# Patient Record
Sex: Female | Born: 1960
Health system: Southern US, Community
[De-identification: ages and names within clinical notes are randomized; demographics above are authoritative.]

## PROBLEM LIST (undated history)

## (undated) DIAGNOSIS — Z8711 Personal history of peptic ulcer disease: Secondary | ICD-10-CM

## (undated) DIAGNOSIS — J45909 Unspecified asthma, uncomplicated: Secondary | ICD-10-CM

## (undated) DIAGNOSIS — T4145XA Adverse effect of unspecified anesthetic, initial encounter: Secondary | ICD-10-CM

## (undated) DIAGNOSIS — M1990 Unspecified osteoarthritis, unspecified site: Secondary | ICD-10-CM

## (undated) DIAGNOSIS — R232 Flushing: Secondary | ICD-10-CM

## (undated) DIAGNOSIS — G4730 Sleep apnea, unspecified: Secondary | ICD-10-CM

## (undated) DIAGNOSIS — Z8719 Personal history of other diseases of the digestive system: Secondary | ICD-10-CM

## (undated) DIAGNOSIS — Z9289 Personal history of other medical treatment: Secondary | ICD-10-CM

## (undated) DIAGNOSIS — K219 Gastro-esophageal reflux disease without esophagitis: Secondary | ICD-10-CM

## (undated) DIAGNOSIS — C50412 Malignant neoplasm of upper-outer quadrant of left female breast: Principal | ICD-10-CM

## (undated) DIAGNOSIS — T8859XA Other complications of anesthesia, initial encounter: Secondary | ICD-10-CM

## (undated) DIAGNOSIS — Z8489 Family history of other specified conditions: Secondary | ICD-10-CM

## (undated) DIAGNOSIS — IMO0001 Reserved for inherently not codable concepts without codable children: Secondary | ICD-10-CM

## (undated) DIAGNOSIS — D649 Anemia, unspecified: Secondary | ICD-10-CM

## (undated) DIAGNOSIS — C50912 Malignant neoplasm of unspecified site of left female breast: Secondary | ICD-10-CM

## (undated) DIAGNOSIS — R0609 Other forms of dyspnea: Secondary | ICD-10-CM

## (undated) DIAGNOSIS — C50911 Malignant neoplasm of unspecified site of right female breast: Secondary | ICD-10-CM

## (undated) DIAGNOSIS — C792 Secondary malignant neoplasm of skin: Secondary | ICD-10-CM

## (undated) DIAGNOSIS — N6320 Unspecified lump in the left breast, unspecified quadrant: Secondary | ICD-10-CM

## (undated) DIAGNOSIS — R0601 Orthopnea: Secondary | ICD-10-CM

## (undated) DIAGNOSIS — C50919 Malignant neoplasm of unspecified site of unspecified female breast: Secondary | ICD-10-CM

## (undated) DIAGNOSIS — C50311 Malignant neoplasm of lower-inner quadrant of right female breast: Secondary | ICD-10-CM

## (undated) DIAGNOSIS — Z853 Personal history of malignant neoplasm of breast: Secondary | ICD-10-CM

## (undated) HISTORY — PX: UPPER GI ENDOSCOPY: SHX6162

## (undated) HISTORY — DX: Malignant neoplasm of upper-outer quadrant of left female breast: C50.412

## (undated) HISTORY — PX: COLONOSCOPY: SHX174

## (undated) HISTORY — DX: Flushing: R23.2

## (undated) HISTORY — PX: ABDOMINAL HYSTERECTOMY: SHX81

## (undated) HISTORY — DX: Anemia, unspecified: D64.9

## (undated) HISTORY — PX: TUBAL LIGATION: SHX77

## (undated) HISTORY — PX: HERNIA REPAIR: SHX51

---

## 2015-07-20 ENCOUNTER — Other Ambulatory Visit: Payer: Self-pay | Admitting: Physician Assistant

## 2015-07-20 DIAGNOSIS — R599 Enlarged lymph nodes, unspecified: Secondary | ICD-10-CM

## 2015-07-21 ENCOUNTER — Other Ambulatory Visit: Payer: Self-pay | Admitting: Physician Assistant

## 2015-07-21 DIAGNOSIS — R599 Enlarged lymph nodes, unspecified: Secondary | ICD-10-CM

## 2015-07-21 DIAGNOSIS — M7989 Other specified soft tissue disorders: Secondary | ICD-10-CM

## 2015-07-30 ENCOUNTER — Ambulatory Visit
Admission: RE | Admit: 2015-07-30 | Discharge: 2015-07-30 | Disposition: A | Discharge status: Home / Self Care | Payer: BLUE CROSS/BLUE SHIELD | Source: Ambulatory Visit | Attending: Physician Assistant | Admitting: Physician Assistant

## 2015-07-30 ENCOUNTER — Other Ambulatory Visit: Payer: Self-pay | Admitting: Physician Assistant

## 2015-07-30 DIAGNOSIS — M7989 Other specified soft tissue disorders: Secondary | ICD-10-CM

## 2015-07-30 DIAGNOSIS — N6320 Unspecified lump in the left breast, unspecified quadrant: Secondary | ICD-10-CM

## 2015-07-30 DIAGNOSIS — R599 Enlarged lymph nodes, unspecified: Secondary | ICD-10-CM

## 2015-08-05 ENCOUNTER — Ambulatory Visit
Admission: RE | Admit: 2015-08-05 | Discharge: 2015-08-05 | Disposition: A | Discharge status: Home / Self Care | Payer: BLUE CROSS/BLUE SHIELD | Source: Ambulatory Visit | Attending: Physician Assistant | Admitting: Physician Assistant

## 2015-08-05 ENCOUNTER — Other Ambulatory Visit: Payer: Self-pay | Admitting: Physician Assistant

## 2015-08-05 DIAGNOSIS — R599 Enlarged lymph nodes, unspecified: Secondary | ICD-10-CM

## 2015-08-05 DIAGNOSIS — N6320 Unspecified lump in the left breast, unspecified quadrant: Secondary | ICD-10-CM

## 2015-08-07 ENCOUNTER — Telehealth: Payer: Self-pay | Admitting: *Deleted

## 2015-08-07 ENCOUNTER — Encounter: Payer: Self-pay | Admitting: *Deleted

## 2015-08-07 DIAGNOSIS — C50412 Malignant neoplasm of upper-outer quadrant of left female breast: Secondary | ICD-10-CM

## 2015-08-07 HISTORY — DX: Malignant neoplasm of upper-outer quadrant of left female breast: C50.412

## 2015-08-07 NOTE — Telephone Encounter (Signed)
Left vm for pt to return call regarding [place] for 08/12/15. Contact information provided.

## 2015-08-07 NOTE — Telephone Encounter (Signed)
Confirmed [facility] for 08/12/15 at 1215 .  Instructions and contact information given.

## 2015-08-12 ENCOUNTER — Other Ambulatory Visit: Payer: Self-pay | Admitting: General Surgery

## 2015-08-12 ENCOUNTER — Encounter: Payer: Self-pay | Admitting: Radiation Oncology

## 2015-08-12 ENCOUNTER — Ambulatory Visit: Payer: BLUE CROSS/BLUE SHIELD | Attending: General Surgery | Admitting: Physical Therapy

## 2015-08-12 ENCOUNTER — Other Ambulatory Visit (HOSPITAL_BASED_OUTPATIENT_CLINIC_OR_DEPARTMENT_OTHER): Payer: BLUE CROSS/BLUE SHIELD

## 2015-08-12 ENCOUNTER — Encounter: Payer: Self-pay | Admitting: Oncology

## 2015-08-12 ENCOUNTER — Ambulatory Visit
Admission: RE | Admit: 2015-08-12 | Discharge: 2015-08-12 | Disposition: A | Discharge status: Home / Self Care | Payer: BLUE CROSS/BLUE SHIELD | Source: Ambulatory Visit | Attending: Radiation Oncology | Admitting: Radiation Oncology

## 2015-08-12 ENCOUNTER — Encounter: Payer: Self-pay | Admitting: Physical Therapy

## 2015-08-12 ENCOUNTER — Ambulatory Visit (HOSPITAL_BASED_OUTPATIENT_CLINIC_OR_DEPARTMENT_OTHER): Payer: BLUE CROSS/BLUE SHIELD | Admitting: Oncology

## 2015-08-12 ENCOUNTER — Telehealth: Payer: Self-pay | Admitting: Oncology

## 2015-08-12 VITALS — BP 116/57 | HR 80 | Temp 98.3°F | Resp 18 | Ht 60.5 in | Wt 145.5 lb

## 2015-08-12 DIAGNOSIS — M2550 Pain in unspecified joint: Secondary | ICD-10-CM

## 2015-08-12 DIAGNOSIS — Z800 Family history of malignant neoplasm of digestive organs: Secondary | ICD-10-CM

## 2015-08-12 DIAGNOSIS — M545 Low back pain: Secondary | ICD-10-CM | POA: Diagnosis not present

## 2015-08-12 DIAGNOSIS — C50412 Malignant neoplasm of upper-outer quadrant of left female breast: Secondary | ICD-10-CM | POA: Diagnosis not present

## 2015-08-12 DIAGNOSIS — C773 Secondary and unspecified malignant neoplasm of axilla and upper limb lymph nodes: Secondary | ICD-10-CM | POA: Diagnosis not present

## 2015-08-12 DIAGNOSIS — R293 Abnormal posture: Secondary | ICD-10-CM | POA: Diagnosis present

## 2015-08-12 DIAGNOSIS — N951 Menopausal and female climacteric states: Secondary | ICD-10-CM

## 2015-08-12 DIAGNOSIS — Z801 Family history of malignant neoplasm of trachea, bronchus and lung: Secondary | ICD-10-CM

## 2015-08-12 DIAGNOSIS — Z803 Family history of malignant neoplasm of breast: Secondary | ICD-10-CM

## 2015-08-12 DIAGNOSIS — Z170 Estrogen receptor positive status [ER+]: Secondary | ICD-10-CM

## 2015-08-12 DIAGNOSIS — G4700 Insomnia, unspecified: Secondary | ICD-10-CM

## 2015-08-12 DIAGNOSIS — Z808 Family history of malignant neoplasm of other organs or systems: Secondary | ICD-10-CM

## 2015-08-12 LAB — COMPREHENSIVE METABOLIC PANEL
ALK PHOS: 48 U/L (ref 40–150)
ALT: 16 U/L (ref 0–55)
ANION GAP: 8 meq/L (ref 3–11)
AST: 20 U/L (ref 5–34)
Albumin: 4.1 g/dL (ref 3.5–5.0)
BUN: 12.8 mg/dL (ref 7.0–26.0)
CO2: 29 mEq/L (ref 22–29)
CREATININE: 1 mg/dL (ref 0.6–1.1)
Calcium: 9.7 mg/dL (ref 8.4–10.4)
Chloride: 107 mEq/L (ref 98–109)
EGFR: 74 mL/min/{1.73_m2} — ABNORMAL LOW (ref >90)
Glucose: 91 mg/dl (ref 70–140)
POTASSIUM: 4.1 meq/L (ref 3.5–5.1)
SODIUM: 144 meq/L (ref 136–145)
Total Bilirubin: 0.36 mg/dL (ref 0.20–1.20)
Total Protein: 7.1 g/dL (ref 6.4–8.3)

## 2015-08-12 LAB — CBC WITH DIFFERENTIAL/PLATELET
BASO%: 0.6 % (ref 0.0–2.0)
BASOS ABS: 0 10*3/uL (ref 0.0–0.1)
EOS%: 2.2 % (ref 0.0–7.0)
Eosinophils Absolute: 0.1 10*3/uL (ref 0.0–0.5)
HCT: 41.7 % (ref 34.8–46.6)
HGB: 13.7 g/dL (ref 11.6–15.9)
LYMPH#: 2 10*3/uL (ref 0.9–3.3)
LYMPH%: 43.8 % (ref 14.0–49.7)
MCH: 30.7 pg (ref 25.1–34.0)
MCHC: 32.9 g/dL (ref 31.5–36.0)
MCV: 93.1 fL (ref 79.5–101.0)
MONO#: 0.3 10*3/uL (ref 0.1–0.9)
MONO%: 7 % (ref 0.0–14.0)
NEUT#: 2.2 10*3/uL (ref 1.5–6.5)
NEUT%: 46.4 % (ref 38.4–76.8)
Platelets: 308 10*3/uL (ref 145–400)
RBC: 4.48 10*6/uL (ref 3.70–5.45)
RDW: 13.2 % (ref 11.2–14.5)
WBC: 4.6 10*3/uL (ref 3.9–10.3)

## 2015-08-12 NOTE — Progress Notes (Signed)
  [facility] Psychosocial Distress Screening Counseling Intern  Counseling Intern was referred by distress screening protocol.  The patient scored a 0 on the Psychosocial Distress Thermometer which indicates Mild distress. Counseling Intern Vaughan Sine to assess for distress and other psychosocial needs.   [facility] DISTRESS SCREENING 08/12/2015  Screening Type Initial Screening  Distress experienced in past week (1-10) 0  Physical Problem type Pain;Breathing  Referral to support programs Yes   Counseling Intern Note: Met with Pt and husband during [facility].  Pt reported no distress related to her Dx due to having a strong faith and having been through difficult times in the past. Pt at times appeared slightly tearful, but denied any difficulty in coping with her Dx or Tx plan. Pt expressed appreciation for when medical professionals take the time to slow down and have a conversation with her.  Pt endorsed having a strong support system including her husband, children, and grandchildren. Counseling Intern introduced supportive programs available in the support center.  Pt stated that she would reach out as needed.    FU: no specific support center follow up at this time.   Vaughan Sine Counseling Intern  [phone]

## 2015-08-12 NOTE — Patient Instructions (Signed)

## 2015-08-12 NOTE — Progress Notes (Addendum)
[facility]  Telephone:([phone]) [phone] Fax:(336) [phone]     ID: Jailani Hogans DOB: 1960-09-24  MR#: [id number]  ZDG#:[id number]  Patient Care Team: Everardo Beals, NP as PCP - General Chauncey Cruel, MD as Consulting Physician (Oncology) Kyung Rudd, MD as Consulting Physician (Radiation Oncology) Stark Klein, MD as Consulting Physician (General Surgery) PCP: Imelda Pillow, NP GYN: OTHER MD:  CHIEF COMPLAINT: Triple negative breast cancer  CURRENT TREATMENT: Neoadjuvant chemotherapy   BREAST CANCER HISTORY: Celicia started experiencing pain in her left breast about a month prior to bringing it to medical attention. The pain would, go but overall it tended to be getting worse. There was no obvious mass or discharge. She finally brought it to [employer] attention in Dr. Ellie Lunch office and he was able to palpate a mass. He set the patient up for bilateral diagnostic mammography with tomography and left breast ultrasonography at the [facility] 07/30/2015. The breast density was category D. In the upper-outer quadrant of the left breast there was a spiculated mass which was palpable at the 1:30 o'clock location 10 cm from the nipple. There was no palpable axillary adenopathy. Ultrasound confirmed an irregular hypoechoic mass in the area just mentioned measuring 2.4 cm. The left axilla showed 2 adjacent lymph nodes with cortical thickening.  On 08/05/2015 [name] underwent biopsy of the left breast mass and also of one of the left axillary lymph nodes in question. The final pathology ([id number]) showed both biopsies to be involved by invasive mammary carcinoma, which proved to be E-cadherin positive and therefore consistent with a ductal phenotype. Prognostic panels were run on both biopsies. Both were estrogen and progesterone receptor negative. The MIB-1 of the breast mass was 10% but that of the lymph node mass was 60%. HER-2 was negative and both cases, with a  signals ratio is between 1.63 and 1.71, and number per cell between 1.95 and 2.05.  Her subsequent history is as detailed below  INTERVAL HISTORY: Denym was evaluated in the multidisciplinary breast cancer clinic for 09/26/2015 accompanied by her significant other [name]. Her case was also presented in the multidisciplinary breast cancer conference that same morning. At that time a preliminary plan was proposed: NEOADJUVANT CHEMOTHERAPY, GENETICS TESTING, CONSIDERATION OF THE ALLIANCE STUDY, AND STAGING STUDIES.   REVIEW OF SYSTEMS: There were no specific symptoms leading to the original mammogram, which was routinely scheduled. The patient denies unusual headaches, visual changes, nausea, vomiting, stiff neck, dizziness, or gait imbalance. There has been no cough, phlegm production, or pleurisy, no chest pain or pressure, and no change in bowel or bladder habits. The patient denies fever, rash, bleeding, unexplained fatigue or unexplained weight loss. The patient does admit to hot flashes and night sweats, with some insomnia. She has seasonal allergies. She has some joint pain and back pain which is not more persistent or intense than prior. She has rare headaches. A detailed review of systems was otherwise entirely negative.  PAST MEDICAL HISTORY: Past Medical History  Diagnosis Date  . Breast cancer of upper-outer quadrant of left female breast ([place]) 08/07/2015  . Anemia   . Hot flashes   . Family history of adverse reaction to anesthesia     younger sister has same issues with anesthesia  . Complication of anesthesia     patient states she has coded multiple times before with anesthesia. her sister has similar issues.   . Shortness of breath dyspnea     if patient's head is flat (per  pt)  . Asthma     only in the summer time- uses albuterol inhaler  . GERD (gastroesophageal reflux disease)   . History of hiatal hernia     had hiatal hernia repair in '94 (per patient)  .  Arthritis   . Hx of peptic ulcer   . Hx of transfusion of packed red blood cells   . Sleep apnea     couldn't tolerate CPAP d/t claustrophobia; sleep upright "90 degree angle": study done at [facility] several years ago    PAST SURGICAL HISTORY: Past Surgical History  Procedure Laterality Date  . Abdominal hysterectomy    . Tubal ligation    . Hernia repair      FAMILY HISTORY Family History  Problem Relation Age of Onset  . Colon cancer Brother 16  . Throat cancer Maternal Grandfather   . Lung cancer Maternal Uncle   . Ovarian cancer Sister 75    suspected but not pathologically confirmed  . Lung cancer Paternal Uncle   . Breast cancer Maternal Aunt 33  The patient's father died at age 2, from causes not related to cancer. The patient's mother is age 107 as of April 2017. The patient has 2 brothers, 3 sisters. The patient has 1 aunt diagnosed with breast cancer in her 56s, a paternal grandfather and an uncle with throat cancer, a brother with colon cancer diagnosed in his 72s, and an uncle on the paternal side with lung cancer.   GYNECOLOGIC HISTORY:  No LMP recorded.  Menarche age 58, first live birth age 59. She is GX P2. The patient underwent total abdominal hysterectomy with bilateral salpingo-oophorectomy for fibroids. However she tells me evaluation 2 years after that surgery showed that she still had a uterus and ovaries. She never called the surgeon ( in [place]) back to discuss this because she says he has retired.   SOCIAL HISTORY:  Emeri works as a Quarry manager. At home she lives with her sister Shelton Silvas and 2 grandchildren, aged 9 and 61. The patient's children are  [employer], lives in [place] and works as a Librarian, academic in a plant,  and e [employer], lives in [place] and is disabled. The patient has a total of 13 grandchildren and one great-grandchild. She attends a local [employer]:  not in place    HEALTH  MAINTENANCE: Social History  Substance Use Topics  . Smoking status: Never Smoker   . Smokeless tobacco: None  . Alcohol Use: No     Colonoscopy: 2006?  PAP: 2014  Bone density:  Lipid panel:  No Known Allergies  No current outpatient prescriptions on file.   No current facility-administered medications for this visit.    OBJECTIVE: Middle-aged African-American woman who appears well  Filed Vitals:   08/12/15 1232  BP: 116/57  Pulse: 80  Temp: 98.3 F (36.8 C)  Resp: 18     Body mass index is 27.94 kg/(m^2).    ECOG FS:0 - Asymptomatic  Ocular: Sclerae unicteric, pupils equal, round and reactive to light Ear-nose-throat: Oropharynx clear and moist Lymphatic: No cervical or supraclavicular adenopathy Lungs no rales or rhonchi, good excursion bilaterally Heart regular rate and rhythm, no murmur appreciated Abd soft, nontender, positive bowel sounds MSK no focal spinal tenderness, no joint edema Neuro: non-focal, well-oriented, appropriate affect Breasts:  the right breast is unremarkable. There is a palpable mass in the upper outer quadrant of the left breast, which is easily movable,  not associated with any skin changes. There is a palpable lymph node in the left axilla.    LAB RESULTS:  CMP     Component Value Date/Time   NA 144 08/12/2015 1149   K 4.1 08/12/2015 1149   CO2 29 08/12/2015 1149   GLUCOSE 91 08/12/2015 1149   BUN 12.8 08/12/2015 1149   CREATININE 1.0 08/12/2015 1149   CALCIUM 9.7 08/12/2015 1149   PROT 7.1 08/12/2015 1149   ALBUMIN 4.1 08/12/2015 1149   AST 20 08/12/2015 1149   ALT 16 08/12/2015 1149   ALKPHOS 48 08/12/2015 1149   BILITOT 0.36 08/12/2015 1149    INo results found for: SPEP, UPEP  Lab Results  Component Value Date   WBC 4.6 08/12/2015   NEUTROABS 2.2 08/12/2015   HGB 13.7 08/12/2015   HCT 41.7 08/12/2015   MCV 93.1 08/12/2015   PLT 308 08/12/2015      Chemistry      Component Value Date/Time   NA 144 08/12/2015  1149   K 4.1 08/12/2015 1149   CO2 29 08/12/2015 1149   BUN 12.8 08/12/2015 1149   CREATININE 1.0 08/12/2015 1149      Component Value Date/Time   CALCIUM 9.7 08/12/2015 1149   ALKPHOS 48 08/12/2015 1149   AST 20 08/12/2015 1149   ALT 16 08/12/2015 1149   BILITOT 0.36 08/12/2015 1149       No results found for: LABCA2  No components found for: LABCA125  No results for input(s): INR in the last 168 hours.  Urinalysis No results found for: COLORURINE, APPEARANCEUR, LABSPEC, PHURINE, GLUCOSEU, HGBUR, BILIRUBINUR, KETONESUR, PROTEINUR, UROBILINOGEN, NITRITE, LEUKOCYTESUR    ELIGIBLE FOR AVAILABLE RESEARCH PROTOCOL: B 51, ALLIANCE  STUDIES: Mm Digital Diagnostic Unilat L  08/05/2015  CLINICAL DATA:  54 year old female status post ultrasound-guided biopsies of a left breast mass at 130 and of an enlarged left axillary lymph node. EXAM: DIAGNOSTIC LEFT MAMMOGRAM POST ULTRASOUND BIOPSY COMPARISON:  Previous exam(s). FINDINGS: Mammographic images were obtained following ultrasound guided biopsies of a left breast mass at 130 and of an enlarged left axillary lymph node. Post biopsy mammogram demonstrates the ribbon shaped biopsy marker to be within the left breast mass at 130 and the spiral shaped biopsy marker to be in the expected position of an enlarged left axillary lymph node. IMPRESSION: Appropriate marker position as above. Final Assessment: Post Procedure Mammograms for Marker Placement Electronically Signed   By: Pamelia Hoit M.D.   On: 08/05/2015 16:31   US Breast Ltd Uni Left Inc Axilla  07/30/2015  CLINICAL DATA:  Palpable abnormality in the left breast. Patient originally noted focal tenderness in the upper-outer quadrant. Palpable mass was noted at the time of her recent physical exam. EXAM: 2D DIGITAL DIAGNOSTIC BILATERAL MAMMOGRAM WITH CAD AND ADJUNCT TOMO ULTRASOUND LEFT BREAST COMPARISON:  01/28/2010 ACR Breast Density Category d: The breast tissue is extremely dense, which  lowers the sensitivity of mammography. FINDINGS: Spiculated mass is identified in the upper-outer quadrant of the left breast. Right breast is negative. Mammographic images were processed with CAD. On physical exam, I palpate a discrete mass in the 130 o'clock location of the left breast 10 cm from the nipple. I palpate no axillary adenopathy. Targeted ultrasound is performed, showing an irregular hypoechoic mass with acoustic shadowing the 130 o'clock location of the left breast 10 cm from the nipple measuring 2.4 x 2.1 x 1.8 cm. Internal vascularity identified by Doppler evaluation. Evaluation of the left axilla shows 2  adjacent lymph nodes with cortical thickening. IMPRESSION: 1. Suspicious mass in the 130 o'clock location of the left breast for which tissue diagnosis is recommended. 2. Suspicious lower left axillary lymph nodes. Tissue diagnosis of 1 of these nodes is recommended. RECOMMENDATION: Ultrasound-guided core biopsy is recommended and scheduled for the patienton 08/05/2015. I have discussed the findings and recommendations with the patient. Results were also provided in writing at the conclusion of the visit. If applicable, a reminder letter will be sent to the patient regarding the next appointment. BI-RADS CATEGORY  5: Highly suggestive of malignancy. Electronically Signed   By: Nolon Nations M.D.   On: 07/30/2015 17:04   Mm Diag Breast Tomo Bilateral  07/30/2015  CLINICAL DATA:  Palpable abnormality in the left breast. Patient originally noted focal tenderness in the upper-outer quadrant. Palpable mass was noted at the time of her recent physical exam. EXAM: 2D DIGITAL DIAGNOSTIC BILATERAL MAMMOGRAM WITH CAD AND ADJUNCT TOMO ULTRASOUND LEFT BREAST COMPARISON:  01/28/2010 ACR Breast Density Category d: The breast tissue is extremely dense, which lowers the sensitivity of mammography. FINDINGS: Spiculated mass is identified in the upper-outer quadrant of the left breast. Right breast is  negative. Mammographic images were processed with CAD. On physical exam, I palpate a discrete mass in the 130 o'clock location of the left breast 10 cm from the nipple. I palpate no axillary adenopathy. Targeted ultrasound is performed, showing an irregular hypoechoic mass with acoustic shadowing the 130 o'clock location of the left breast 10 cm from the nipple measuring 2.4 x 2.1 x 1.8 cm. Internal vascularity identified by Doppler evaluation. Evaluation of the left axilla shows 2 adjacent lymph nodes with cortical thickening. IMPRESSION: 1. Suspicious mass in the 130 o'clock location of the left breast for which tissue diagnosis is recommended. 2. Suspicious lower left axillary lymph nodes. Tissue diagnosis of 1 of these nodes is recommended. RECOMMENDATION: Ultrasound-guided core biopsy is recommended and scheduled for the patienton 08/05/2015. I have discussed the findings and recommendations with the patient. Results were also provided in writing at the conclusion of the visit. If applicable, a reminder letter will be sent to the patient regarding the next appointment. BI-RADS CATEGORY  5: Highly suggestive of malignancy. Electronically Signed   By: Nolon Nations M.D.   On: 07/30/2015 17:04   [name] Lt Breast Bx W Loc Dev 1st Lesion Img Bx Spec US Guide  08/06/2015  ADDENDUM REPORT: 08/06/2015 14:38 ADDENDUM: Pathology revealed GRADE II INVASIVE MAMMARY CARCINOMA of the Left breast at the 1:30 o'clock location. ONE LYMPH NODE POSITIVE FOR METASTATIC MAMMARY CARCINOMA of the Left axilla. This was found to be concordant by Dr. Pamelia Hoit. Pathology results were discussed with the patient by telephone. The patient reported doing well after the biopsy with tenderness at the site. Post biopsy instructions and care were reviewed and questions were answered. The patient was encouraged to call [facility] for any additional concerns. The patient was referred to [facility] on August 11, 2015. Pathology results reported by Terie Purser, RN on 08/06/2015. Electronically Signed   By: Pamelia Hoit M.D.   On: 08/06/2015 14:38  08/06/2015  CLINICAL DATA:  55 year old female for ultrasound-guided biopsy of an enlarged left axillary lymph node EXAM: ULTRASOUND GUIDED CORE NEEDLE BIOPSY OF A LEFT AXILLARY NODE COMPARISON:  Previous exam(s). FINDINGS: I met with the patient and we discussed the procedure of ultrasound-guided biopsy, including benefits and  alternatives. We discussed the high likelihood of a successful procedure. We discussed the risks of the procedure, including infection, bleeding, tissue injury, clip migration, and inadequate sampling. Informed written consent was given. The usual time-out protocol was performed immediately prior to the procedure. Using sterile technique and 1% Lidocaine as local anesthetic, under direct ultrasound visualization, a 14 gauge spring-loaded device was used to perform biopsy of an enlarged left axillary lymph node using a lateral to medial approach. At the conclusion of the procedure a spiral shaped tissue marker clip was deployed into the biopsy cavity. Follow up 2 view mammogram was performed and dictated separately. IMPRESSION: Ultrasound guided biopsy of an enlarged left axillary lymph node. No apparent complications. Electronically Signed: By: Pamelia Hoit M.D. On: 08/05/2015 16:30   [name] Lt Breast Bx W Loc Dev Ea Add Lesion Img Bx Spec US Guide  08/06/2015  ADDENDUM REPORT: 08/06/2015 14:38 ADDENDUM: Pathology revealed GRADE II INVASIVE MAMMARY CARCINOMA of the Left breast at the 1:30 o'clock location. ONE LYMPH NODE POSITIVE FOR METASTATIC MAMMARY CARCINOMA of the Left axilla. This was found to be concordant by Dr. Pamelia Hoit. Pathology results were discussed with the patient by telephone. The patient reported doing well after the biopsy with tenderness at the site. Post biopsy  instructions and care were reviewed and questions were answered. The patient was encouraged to call [facility] for any additional concerns. The patient was referred to [facility] on August 11, 2015. Pathology results reported by Terie Purser, RN on 08/06/2015. Electronically Signed   By: Pamelia Hoit M.D.   On: 08/06/2015 14:38  08/06/2015  CLINICAL DATA:  55 year old female for ultrasound-guided biopsy of a left breast mass at 130 EXAM: ULTRASOUND GUIDED LEFT BREAST CORE NEEDLE BIOPSY COMPARISON:  Previous exam(s). FINDINGS: I met with the patient and we discussed the procedure of ultrasound-guided biopsy, including benefits and alternatives. We discussed the high likelihood of a successful procedure. We discussed the risks of the procedure, including infection, bleeding, tissue injury, clip migration, and inadequate sampling. Informed written consent was given. The usual time-out protocol was performed immediately prior to the procedure. Using sterile technique and 1% Lidocaine as local anesthetic, under direct ultrasound visualization, a 12 gauge spring-loaded device was used to perform biopsy of a left breast mass at 130 using a lateral to medial approach. At the conclusion of the procedure a ribbon shaped tissue marker clip was deployed into the biopsy cavity. Follow up 2 view mammogram was performed and dictated separately. IMPRESSION: Ultrasound guided biopsy of a left breast mass at 130. No apparent complications. Electronically Signed: By: Pamelia Hoit M.D. On: 08/05/2015 16:30    ASSESSMENT: 55 y.o. [place] woman status post left breast upper outer quadrant and left axillary lymph node biopsy 08/05/2015, both positive for an invasive ductal carcinoma, grade 2 or 3, triple negative, with an MIB-1 between 10 and 60%   (1) neoadjuvant chemotherapy will consist of doxorubicin and cyclophosphamide in dose  dense fashion 4 followed by weekly paclitaxel and carboplatin 12  (2) definitive surgery we will follow chemotherapy  (3) adjuvant radiation to follow as appropriate  (4) genetics testing pending  PLAN: We spent the better part of today's hour-long appointment discussing the biology of breast cancer in general, and the specifics of the patient's tumor in particular. Dianelly understands cancer is not one disease but 120 different diseases cold cancer, each different from the others, so  that it is important not to confuse information relating to 1 cancer as possibly relating to her first.  As far as breast cancer is concerned discussed the difference between local and systemic therapy. From a local point of view she understands there is no survival advantage to mastectomy as compared to lumpectomy and radiation. There is also no survival difference if chemotherapy is given before surgery or surgery is done before chemotherapy  In her case we suggest chemotherapy first because it will give her time to get her genetics testing completed, and this may affect her final surgical decision. Also it will make the surgery easier and make it more likely for her to be able to keep her breast.  As far as systemic therapy is concerned she understands she is not a candidate for anti-estrogens or anti-HER-2 treatment. The only systemic therapy we have available for her is chemotherapy. Accordingly that is what we recommend.  More specifically she will receive doxorubicin and cyclophosphamide in dose dense fashion 4 followed by weekly paclitaxel and carboplatin 12. This will take 20 weeks. We discussed the possible toxicities, side effects and complications in detail.  She would like to be able to continue to work so we are treating her on a Thursday. She should be able to get back to work on a Monday. For follow-up appointments she would prefer a time after 3 PM since she generally works from early in the  morning until 3 PM.  Because she may well have stage III disease, we are obtaining staging studies.  Ariele has a good understanding of the overall plan. She agrees with it. She knows the goal of treatment in her case is cure. She will call with any problems that may develop before her next visit here.  Chauncey Cruel, MD   08/12/2015 7:21 PM Medical Oncology and Hematology [facility] [address] [place], [place] [address] Tel. [phone]    Fax. [phone]

## 2015-08-12 NOTE — Therapy (Signed)
[place], [place], [address] Phone: [phone]   Fax:  [phone]  Physical Therapy Evaluation  Patient Details  Name: Gloria Lang MRN: [id number] Date of Birth: March 30, 1961 Referring Provider: Dr. Stark Klein  Encounter Date: 08/12/2015      PT End of Session - 08/12/15 1624    Visit Number 1   Number of Visits 1   PT Start Time 1350   PT Stop Time 1417   PT Time Calculation (min) 27 min   Activity Tolerance Patient tolerated treatment well   Behavior During Therapy [facility] for tasks assessed/performed      Past Medical History  Diagnosis Date  . Breast cancer of upper-outer quadrant of left female breast ([place]) 08/07/2015  . Anemia   . Hot flashes   . HIV infection [facility])     Past Surgical History  Procedure Laterality Date  . Abdominal hysterectomy    . Tubal ligation    . Hernia repair      There were no vitals filed for this visit.  Visit Diagnosis:  Carcinoma of upper-outer quadrant of left female breast [facility]) - Plan: PT plan of care cert/re-cert  Abnormal posture - Plan: PT plan of care cert/re-cert      Subjective Assessment - 08/12/15 1501    Subjective Patient is here being seen for her newly diagnosed left breast cancer.   Pertinent History Patient was diagnosed on 07/30/15 with left Triple negative lymph node positive grade 2-3 invasive mammary carcinoma. It is located in the upper outer quadrant measuring 2.4 cm with a Ki67 of 60%.   Patient Stated Goals reduce lymphedema risk and learn post op shoulder ROM HEP   Currently in Pain? No/denies            [facility] PT Assessment - 08/12/15 0001    Assessment   Medical Diagnosis Left breast cancer   Referring Provider Dr. Stark Klein   Onset Date/Surgical Date 07/30/15   Hand Dominance Right   Prior Therapy none   Precautions   Precautions Other (comment)   Precaution Comments Active breast cancer   Restrictions   Weight Bearing Restrictions No   Balance Screen   Has the patient fallen in the past 6 months No   Has the patient had a decrease in activity level because of a fear of falling?  No   Is the patient reluctant to leave their home because of a fear of falling?  No   Home Environment   Living Environment Private residence   Living Arrangements Other relatives  Lives with sister and 62 and 12 y.o. granddaughters   Available Help at Discharge Family   Prior Function   Level of Independence Independent   Vocation Full time employment   Vocation Requirements CNA   Leisure She walks 30 minutes per day   Cognition   Overall Cognitive Status Within Functional Limits for tasks assessed   Posture/Postural Control   Posture/Postural Control Postural limitations   Postural Limitations Rounded Shoulders;Forward head   ROM / Strength   AROM / PROM / Strength AROM;Strength   AROM   AROM Assessment Site Shoulder;Cervical   Right/Left Shoulder Right;Left   Right Shoulder Extension 40 Degrees   Right Shoulder Flexion 158 Degrees   Right Shoulder ABduction 170 Degrees   Right Shoulder Internal Rotation 62 Degrees   Right Shoulder External Rotation 72 Degrees   Left Shoulder Extension 59 Degrees   Left Shoulder Flexion 156 Degrees  Left Shoulder ABduction 154 Degrees   Left Shoulder Internal Rotation 87 Degrees   Left Shoulder External Rotation 57 Degrees   Cervical Flexion WNL   Cervical Extension WNL   Cervical - Right Side Bend WNL   Cervical - Left Side Bend WNL   Cervical - Right Rotation WNL   Cervical - Left Rotation WNL   Strength   Overall Strength Within functional limits for tasks performed           LYMPHEDEMA/ONCOLOGY QUESTIONNAIRE - 08/12/15 1622    Type   Cancer Type Left breast cancer   Lymphedema Assessments   Lymphedema Assessments Upper extremities   Right Upper Extremity Lymphedema   10 cm Proximal to Olecranon Process 29.3 cm   Olecranon Process 24.3 cm   10  cm Proximal to Ulnar Styloid Process 22.2 cm   Just Proximal to Ulnar Styloid Process 15.1 cm   Across Hand at [employer] 18.1 cm   At [place] of 2nd Digit 6 cm   Left Upper Extremity Lymphedema   10 cm Proximal to Olecranon Process 28.8 cm   Olecranon Process 23.6 cm   10 cm Proximal to Ulnar Styloid Process 20.8 cm   Just Proximal to Ulnar Styloid Process 14.4 cm   Across Hand at [employer] 17.8 cm   At [place] of 2nd Digit 5.8 cm      Patient was instructed today in a home exercise program today for post op shoulder range of motion. These included active assist shoulder flexion in sitting, scapular retraction, wall walking with shoulder abduction, and hands behind head external rotation.  She was encouraged to do these twice a day, holding 3 seconds and repeating 5 times when permitted by her physician.         PT Education - 08/12/15 1623    Education provided Yes   Education Details Lymphedema risk reduction and post op shoulder ROM HEP   Person(s) Educated Patient   Methods Explanation;Demonstration;Handout   Comprehension Returned demonstration;Verbalized understanding              Breast Clinic Goals - 08/12/15 1633    Patient will be able to verbalize understanding of pertinent lymphedema risk reduction practices relevant to her diagnosis specifically related to skin care.   Time 1   Period Days   Status Achieved   Patient will be able to return demonstrate and/or verbalize understanding of the post-op home exercise program related to regaining shoulder range of motion.   Time 1   Period Days   Status Achieved   Patient will be able to verbalize understanding of the importance of attending the postoperative After Breast Cancer Class for further lymphedema risk reduction education and therapeutic exercise.   Time 1   Period Days   Status Achieved              Plan - 08/12/15 1628    Clinical Impression Statement Patient was diagnosed on 07/30/15  with left Triple negative lymph node positive grade 2-3 invasive mammary carcinoma. It is located in the upper outer quadrant measuring 2.4 cm with a Ki67 of 60%.  She is planning to have neoadjuvant chemotherapy followed by a left lumpectomy and sentinel node biopsy and radiation.  She may benefit from post op PT to regain shoulder ROM and strength and reduce lymphedema risk.   Pt will benefit from skilled therapeutic intervention in order to improve on the following deficits Decreased strength;Decreased knowledge of precautions;Pain;Impaired UE functional use;Decreased  range of motion   Rehab Potential Excellent   Clinical Impairments Affecting Rehab Potential none   PT Frequency One time visit   PT Treatment/Interventions Therapeutic exercise;Patient/family education   PT Next Visit Plan Will f/u after surgery    PT Home Exercise Plan Post op shoulder ROM HEP   Consulted and Agree with Plan of Care Patient     Patient will follow up at outpatient cancer rehab if needed following surgery.  If the patient requires physical therapy at that time, a specific plan will be dictated and sent to the referring physician for approval. The patient was educated today on appropriate basic range of motion exercises to begin post operatively and the importance of attending the After Breast Cancer class following surgery.  Patient was educated today on lymphedema risk reduction practices as it pertains to recommendations that will benefit the patient immediately following surgery.  She verbalized good understanding.  No additional physical therapy is indicated at this time.       Problem List Patient Active Problem List   Diagnosis Date Noted  . Breast cancer of upper-outer quadrant of left female breast ([place]) 08/07/2015    Annia Friendly, PT 08/12/2015 4:36 PM  [place], [place], [address] Phone: [phone]   Fax:   [phone]  Name: Gloria Lang MRN: [id number] Date of Birth: 11-Feb-1961

## 2015-08-12 NOTE — Telephone Encounter (Signed)
appt made and avs will print at end of visit

## 2015-08-12 NOTE — Telephone Encounter (Signed)
Echo to be sch once precert is obtained. Need override for 4/27 GM appt at 8 am

## 2015-08-12 NOTE — Progress Notes (Deleted)
Radiation Oncology         [phone] ________________________________  Name: Gloria Lang MRN: [id number]  Date: 08/12/2015  DOB: Apr 22, 1961  [id number], Luane School, NP  Stark Klein, MD     REFERRING PHYSICIAN: Stark Klein, MD   DIAGNOSIS: There were no encounter diagnoses.   HISTORY OF PRESENT ILLNESS: Gloria Lang is a 55 y.o. female seen in the [facility] breast clinic for a new diagnosis of breast cancer. She had a palpable mass in the left breast noted for about 2 weeks. A mammogram found a visible mass, and an ultrasound on 07/30/15 revealed a 2.4 x 2.1 x 1.8 cm mass. She had two lymph nodes htat were notible for cortical thickening. A biopsy on 08/05/15 revealed an invasive mammary cancer, with one lymph node also being involved. She comes today for further discussion of care, and meets with Dr. Lisbeth Renshaw regarding the role of radiotherapy.    PREVIOUS RADIATION THERAPY: No   PAST MEDICAL HISTORY:  Past Medical History  Diagnosis Date  . Breast cancer of upper-outer quadrant of left female breast ([place]) 08/07/2015  . Anemia   . Hot flashes   . HIV infection ([place])        PAST SURGICAL HISTORY: Past Surgical History  Procedure Laterality Date  . Abdominal hysterectomy    . Tubal ligation    . Hernia repair       FAMILY HISTORY:  Family History  Problem Relation Age of Onset  . Colon cancer Brother 17  . Throat cancer Maternal Grandfather   . Lung cancer Maternal Uncle   . Ovarian cancer Sister 66    suspected but not pathologically confirmed  . Lung cancer Paternal Uncle   . Breast cancer Maternal Aunt 60     SOCIAL HISTORY:  reports that she has never smoked. She does not have any smokeless tobacco history on file. She reports that she does not drink alcohol or use illicit drugs. The patient is single and resides in [place]. She is a Psychologist, sport and exercise.   ALLERGIES: Review of patient's allergies indicates no known allergies.   MEDICATIONS:  No current  outpatient prescriptions on file.   No current facility-administered medications for this encounter.     REVIEW OF SYSTEMS: On review of systems, the patient reports that she is doing well overall. She2 denies any chest pain, shortness of breath, cough, fevers, chills, night sweats, unintended weight changes. She denies any bowel or bladder disturbances, and denies abdominal pain, nausea or vomiting. She denies any new musculoskeletal or joint aches or pains. A complete review of systems is obtained and is otherwise negative.     PHYSICAL EXAM:   Pain scale 0/10 In general this is a well appearing African American female in no acute distress. She is alert and oriented x4 and appropriate throughout the examination. HEENT reveals that the patient is normocephalic, atraumatic. EOMs are intact. PERRLA. Skin is intact without any evidence of gross lesions. Cardiovascular exam reveals a regular rate and rhythm, no clicks rubs or murmurs are auscultated. Chest is clear to auscultation bilaterally. Lymphatic assessment is performed and does not reveal any adenopathy in the cervical, supraclavicular, axillary, or inguinal chains. Bilateral breasts are evaluated. The right does note reveal any visible abnormalities. No palpable masses are noted. No nipple discharge is noted. The left breast reveals fullness along her previous biopsy site on the left lower outer quadrant. No other masses are noted. The right breast is negative for palpable abnormalities. No nipple bleeding  or discharge is noted bilaterally. Abdomen has active bowel sounds in all quadrants and is intact. The abdomen is soft, non tender, non distended. Lower extremities are negative for pretibial pitting edema, deep calf tenderness, cyanosis or clubbing.   ECOG = 0  0 - Asymptomatic (Fully active, able to carry on all predisease activities without restriction)  1 - Symptomatic but completely ambulatory (Restricted in physically strenuous  activity but ambulatory and able to carry out work of a light or sedentary nature. For example, light housework, office work)  2 - Symptomatic, <50% in bed during the day (Ambulatory and capable of all self care but unable to carry out any work activities. Up and about more than 50% of waking hours)  3 - Symptomatic, >50% in bed, but not bedbound (Capable of only limited self-care, confined to bed or chair 50% or more of waking hours)  4 - Bedbound (Completely disabled. Cannot carry on any self-care. Totally confined to bed or chair)  5 - Death   Eustace Pen MM, Creech RH, Tormey DC, et al. [phone]). "Toxicity and response criteria of the [facility]". [place] Oncol. 5 (6): 649-55    LABORATORY DATA:  Lab Results  Component Value Date   WBC 4.6 08/12/2015   HGB 13.7 08/12/2015   HCT 41.7 08/12/2015   MCV 93.1 08/12/2015   PLT 308 08/12/2015   Lab Results  Component Value Date   NA 144 08/12/2015   K 4.1 08/12/2015   CO2 29 08/12/2015   Lab Results  Component Value Date   ALT 16 08/12/2015   AST 20 08/12/2015   ALKPHOS 48 08/12/2015   BILITOT 0.36 08/12/2015      RADIOGRAPHY: Mm Digital Diagnostic Unilat L  08/05/2015  CLINICAL DATA:  55 year old female status post ultrasound-guided biopsies of a left breast mass at 130 and of an enlarged left axillary lymph node. EXAM: DIAGNOSTIC LEFT MAMMOGRAM POST ULTRASOUND BIOPSY COMPARISON:  Previous exam(s). FINDINGS: Mammographic images were obtained following ultrasound guided biopsies of a left breast mass at 130 and of an enlarged left axillary lymph node. Post biopsy mammogram demonstrates the ribbon shaped biopsy marker to be within the left breast mass at 130 and the spiral shaped biopsy marker to be in the expected position of an enlarged left axillary lymph node. IMPRESSION: Appropriate marker position as above. Final Assessment: Post Procedure Mammograms for Marker Placement Electronically Signed   By: Pamelia Hoit M.D.   On: 08/05/2015 16:31   US Breast Ltd Uni Left Inc Axilla  07/30/2015  CLINICAL DATA:  Palpable abnormality in the left breast. Patient originally noted focal tenderness in the upper-outer quadrant. Palpable mass was noted at the time of her recent physical exam. EXAM: 2D DIGITAL DIAGNOSTIC BILATERAL MAMMOGRAM WITH CAD AND ADJUNCT TOMO ULTRASOUND LEFT BREAST COMPARISON:  01/28/2010 ACR Breast Density Category d: The breast tissue is extremely dense, which lowers the sensitivity of mammography. FINDINGS: Spiculated mass is identified in the upper-outer quadrant of the left breast. Right breast is negative. Mammographic images were processed with CAD. On physical exam, I palpate a discrete mass in the 130 o'clock location of the left breast 10 cm from the nipple. I palpate no axillary adenopathy. Targeted ultrasound is performed, showing an irregular hypoechoic mass with acoustic shadowing the 130 o'clock location of the left breast 10 cm from the nipple measuring 2.4 x 2.1 x 1.8 cm. Internal vascularity identified by Doppler evaluation. Evaluation of the left axilla shows 2 adjacent lymph  nodes with cortical thickening. IMPRESSION: 1. Suspicious mass in the 130 o'clock location of the left breast for which tissue diagnosis is recommended. 2. Suspicious lower left axillary lymph nodes. Tissue diagnosis of 1 of these nodes is recommended. RECOMMENDATION: Ultrasound-guided core biopsy is recommended and scheduled for the patienton 08/05/2015. I have discussed the findings and recommendations with the patient. Results were also provided in writing at the conclusion of the visit. If applicable, a reminder letter will be sent to the patient regarding the next appointment. BI-RADS CATEGORY  5: Highly suggestive of malignancy. Electronically Signed   By: Nolon Nations M.D.   On: 07/30/2015 17:04   Mm Diag Breast Tomo Bilateral  07/30/2015  CLINICAL DATA:  Palpable abnormality in the left breast. Patient  originally noted focal tenderness in the upper-outer quadrant. Palpable mass was noted at the time of her recent physical exam. EXAM: 2D DIGITAL DIAGNOSTIC BILATERAL MAMMOGRAM WITH CAD AND ADJUNCT TOMO ULTRASOUND LEFT BREAST COMPARISON:  01/28/2010 ACR Breast Density Category d: The breast tissue is extremely dense, which lowers the sensitivity of mammography. FINDINGS: Spiculated mass is identified in the upper-outer quadrant of the left breast. Right breast is negative. Mammographic images were processed with CAD. On physical exam, I palpate a discrete mass in the 130 o'clock location of the left breast 10 cm from the nipple. I palpate no axillary adenopathy. Targeted ultrasound is performed, showing an irregular hypoechoic mass with acoustic shadowing the 130 o'clock location of the left breast 10 cm from the nipple measuring 2.4 x 2.1 x 1.8 cm. Internal vascularity identified by Doppler evaluation. Evaluation of the left axilla shows 2 adjacent lymph nodes with cortical thickening. IMPRESSION: 1. Suspicious mass in the 130 o'clock location of the left breast for which tissue diagnosis is recommended. 2. Suspicious lower left axillary lymph nodes. Tissue diagnosis of 1 of these nodes is recommended. RECOMMENDATION: Ultrasound-guided core biopsy is recommended and scheduled for the patienton 08/05/2015. I have discussed the findings and recommendations with the patient. Results were also provided in writing at the conclusion of the visit. If applicable, a reminder letter will be sent to the patient regarding the next appointment. BI-RADS CATEGORY  5: Highly suggestive of malignancy. Electronically Signed   By: Nolon Nations M.D.   On: 07/30/2015 17:04   [name] Lt Breast Bx W Loc Dev 1st Lesion Img Bx Spec US Guide  08/06/2015  ADDENDUM REPORT: 08/06/2015 14:38 ADDENDUM: Pathology revealed GRADE II INVASIVE MAMMARY CARCINOMA of the Left breast at the 1:30 o'clock location. ONE LYMPH NODE POSITIVE FOR METASTATIC  MAMMARY CARCINOMA of the Left axilla. This was found to be concordant by Dr. Pamelia Hoit. Pathology results were discussed with the patient by telephone. The patient reported doing well after the biopsy with tenderness at the site. Post biopsy instructions and care were reviewed and questions were answered. The patient was encouraged to call [facility] for any additional concerns. The patient was referred to [facility] on August 11, 2015. Pathology results reported by Terie Purser, RN on 08/06/2015. Electronically Signed   By: Pamelia Hoit M.D.   On: 08/06/2015 14:38  08/06/2015  CLINICAL DATA:  55 year old female for ultrasound-guided biopsy of an enlarged left axillary lymph node EXAM: ULTRASOUND GUIDED CORE NEEDLE BIOPSY OF A LEFT AXILLARY NODE COMPARISON:  Previous exam(s). FINDINGS: I met with the patient and we discussed the procedure of ultrasound-guided biopsy, including benefits and alternatives. We  discussed the high likelihood of a successful procedure. We discussed the risks of the procedure, including infection, bleeding, tissue injury, clip migration, and inadequate sampling. Informed written consent was given. The usual time-out protocol was performed immediately prior to the procedure. Using sterile technique and 1% Lidocaine as local anesthetic, under direct ultrasound visualization, a 14 gauge spring-loaded device was used to perform biopsy of an enlarged left axillary lymph node using a lateral to medial approach. At the conclusion of the procedure a spiral shaped tissue marker clip was deployed into the biopsy cavity. Follow up 2 view mammogram was performed and dictated separately. IMPRESSION: Ultrasound guided biopsy of an enlarged left axillary lymph node. No apparent complications. Electronically Signed: By: Pamelia Hoit M.D. On: 08/05/2015 16:30   [name] Lt Breast Bx W Loc Dev Ea Add Lesion Img Bx  Spec US Guide  08/06/2015  ADDENDUM REPORT: 08/06/2015 14:38 ADDENDUM: Pathology revealed GRADE II INVASIVE MAMMARY CARCINOMA of the Left breast at the 1:30 o'clock location. ONE LYMPH NODE POSITIVE FOR METASTATIC MAMMARY CARCINOMA of the Left axilla. This was found to be concordant by Dr. Pamelia Hoit. Pathology results were discussed with the patient by telephone. The patient reported doing well after the biopsy with tenderness at the site. Post biopsy instructions and care were reviewed and questions were answered. The patient was encouraged to call [facility] for any additional concerns. The patient was referred to [facility] on August 11, 2015. Pathology results reported by Terie Purser, RN on 08/06/2015. Electronically Signed   By: Pamelia Hoit M.D.   On: 08/06/2015 14:38  08/06/2015  CLINICAL DATA:  55 year old female for ultrasound-guided biopsy of a left breast mass at 130 EXAM: ULTRASOUND GUIDED LEFT BREAST CORE NEEDLE BIOPSY COMPARISON:  Previous exam(s). FINDINGS: I met with the patient and we discussed the procedure of ultrasound-guided biopsy, including benefits and alternatives. We discussed the high likelihood of a successful procedure. We discussed the risks of the procedure, including infection, bleeding, tissue injury, clip migration, and inadequate sampling. Informed written consent was given. The usual time-out protocol was performed immediately prior to the procedure. Using sterile technique and 1% Lidocaine as local anesthetic, under direct ultrasound visualization, a 12 gauge spring-loaded device was used to perform biopsy of a left breast mass at 130 using a lateral to medial approach. At the conclusion of the procedure a ribbon shaped tissue marker clip was deployed into the biopsy cavity. Follow up 2 view mammogram was performed and dictated separately. IMPRESSION: Ultrasound guided biopsy  of a left breast mass at 130. No apparent complications. Electronically Signed: By: Pamelia Hoit M.D. On: 08/05/2015 16:30       IMPRESSION: cT2, N1, M0, triple negative invasive mammary carcinoma of the left breast.  PLAN: Dr. Lisbeth Renshaw discusses the pathology findings and reviews the nature of invasive breast  disease. The consensus from the breast conference include MRI to evaluate for metastatic disease, echocardiogram, PAC placement and Neoadjuvant chemotherapy. Following this, Dr. Lisbeth Renshaw discusses that if she undergoes breast conservation with lumpectomy, we would recommend external radiotherapy to the left breast. He reviews the recommendation of 33 fractions of treatment and that we would recommend meeting back with her about 2 weeks after surgery, and anticipate this beginning 4 weeks after surgery. He also discusses the possible use of breath hold technique. The risks, benefits, short, and long term effects of radiation therapy are reviewed. At this time, the  patient wants to await the results of her MRI before moving forward with scheduling surgery, or with any additional planning in anticipation of treatment.   The above documentation reflects my direct findings during this shared patient visit. Please see the separate note by Dr. Lisbeth Renshaw on this date for the remainder of the patient's plan of care.    Carola Rhine, PAC

## 2015-08-13 ENCOUNTER — Other Ambulatory Visit: Payer: BLUE CROSS/BLUE SHIELD

## 2015-08-13 ENCOUNTER — Encounter: Payer: BLUE CROSS/BLUE SHIELD | Admitting: Genetic Counselor

## 2015-08-13 ENCOUNTER — Encounter: Payer: Self-pay | Admitting: *Deleted

## 2015-08-14 ENCOUNTER — Ambulatory Visit (HOSPITAL_COMMUNITY): Payer: BLUE CROSS/BLUE SHIELD | Attending: Oncology

## 2015-08-17 ENCOUNTER — Telehealth: Payer: Self-pay | Admitting: *Deleted

## 2015-08-17 NOTE — Telephone Encounter (Signed)
Left message for a return phone call to follow up from [facility]. °

## 2015-08-18 ENCOUNTER — Other Ambulatory Visit: Payer: BLUE CROSS/BLUE SHIELD

## 2015-08-19 ENCOUNTER — Encounter (HOSPITAL_COMMUNITY)
Admission: RE | Admit: 2015-08-19 | Discharge: 2015-08-19 | Disposition: A | Discharge status: Home / Self Care | Payer: BLUE CROSS/BLUE SHIELD | Source: Ambulatory Visit | Attending: General Surgery | Admitting: General Surgery

## 2015-08-19 ENCOUNTER — Encounter (HOSPITAL_COMMUNITY): Payer: Self-pay

## 2015-08-19 ENCOUNTER — Ambulatory Visit
Admission: RE | Admit: 2015-08-19 | Discharge: 2015-08-19 | Disposition: A | Discharge status: Home / Self Care | Payer: BLUE CROSS/BLUE SHIELD | Source: Ambulatory Visit | Attending: Oncology | Admitting: Oncology

## 2015-08-19 DIAGNOSIS — C50412 Malignant neoplasm of upper-outer quadrant of left female breast: Secondary | ICD-10-CM | POA: Diagnosis not present

## 2015-08-19 DIAGNOSIS — G4733 Obstructive sleep apnea (adult) (pediatric): Secondary | ICD-10-CM | POA: Diagnosis not present

## 2015-08-19 DIAGNOSIS — C50912 Malignant neoplasm of unspecified site of left female breast: Secondary | ICD-10-CM | POA: Diagnosis present

## 2015-08-19 DIAGNOSIS — Z171 Estrogen receptor negative status [ER-]: Secondary | ICD-10-CM | POA: Diagnosis not present

## 2015-08-19 HISTORY — DX: Sleep apnea, unspecified: G47.30

## 2015-08-19 HISTORY — DX: Gastro-esophageal reflux disease without esophagitis: K21.9

## 2015-08-19 HISTORY — DX: Unspecified osteoarthritis, unspecified site: M19.90

## 2015-08-19 HISTORY — DX: Personal history of other diseases of the digestive system: Z87.19

## 2015-08-19 HISTORY — DX: Adverse effect of unspecified anesthetic, initial encounter: T41.45XA

## 2015-08-19 HISTORY — DX: Other complications of anesthesia, initial encounter: T88.59XA

## 2015-08-19 HISTORY — DX: Personal history of other medical treatment: Z92.89

## 2015-08-19 HISTORY — DX: Family history of other specified conditions: Z84.89

## 2015-08-19 HISTORY — DX: Unspecified asthma, uncomplicated: J45.909

## 2015-08-19 HISTORY — DX: Personal history of peptic ulcer disease: Z87.11

## 2015-08-19 HISTORY — DX: Reserved for inherently not codable concepts without codable children: IMO0001

## 2015-08-19 LAB — CBC
HCT: 41.1 % (ref 36.0–46.0)
Hemoglobin: 13.5 g/dL (ref 12.0–15.0)
MCH: 30.3 pg (ref 26.0–34.0)
MCHC: 32.8 g/dL (ref 30.0–36.0)
MCV: 92.4 fL (ref 78.0–100.0)
PLATELETS: 307 10*3/uL (ref 150–400)
RBC: 4.45 MIL/uL (ref 3.87–5.11)
RDW: 13 % (ref 11.5–15.5)
WBC: 6.7 10*3/uL (ref 4.0–10.5)

## 2015-08-19 LAB — BASIC METABOLIC PANEL
Anion gap: 10 (ref 5–15)
BUN: 13 mg/dL (ref 6–20)
CALCIUM: 10.1 mg/dL (ref 8.9–10.3)
CO2: 26 mmol/L (ref 22–32)
CREATININE: 0.9 mg/dL (ref 0.44–1.00)
Chloride: 106 mmol/L (ref 101–111)
GFR calc Af Amer: >60 mL/min (ref >60)
GLUCOSE: 82 mg/dL (ref 65–99)
Potassium: 4.9 mmol/L (ref 3.5–5.1)
Sodium: 142 mmol/L (ref 135–145)

## 2015-08-19 MED ORDER — GADOBENATE DIMEGLUMINE 529 MG/ML IV SOLN
13.0000 mL | Freq: Once | INTRAVENOUS | Status: AC | PRN
  Administered 2015-08-19: 13 mL via INTRAVENOUS

## 2015-08-19 MED ORDER — CEFAZOLIN SODIUM-DEXTROSE 2-4 GM/100ML-% IV SOLN
2.0000 g | INTRAVENOUS | Status: AC
Start: 2015-08-20 — End: 2015-08-20
  Administered 2015-08-20: 2 g via INTRAVENOUS
  Filled 2015-08-19: qty 100

## 2015-08-19 NOTE — Progress Notes (Addendum)
Anesthesia Note: Patient is a 55 year old female scheduled for Port-a-cath insertion on 08/20/15 by Dr. Barry Dienes. Chemotherapy has been recommended for left breast cancer with anticipation of future surgical intervention.  History includes non-smoker, anemia, OSA (could not tolerate CPAP due to claustrophobia; sleeps upright now on "10 pillows" at about a "90 degree angle"), asthma, left breast cancer '17, asthma, arthritis, PUD (after hysterectomy), migraine headache, abdominal hysterectomy, tubal ligation, hiatal hernia repair. BMI 29. (Of note, apparently HIV was erroneously entered into her health history from a previous encounter. She denied this and it was subsequently removed.)  For anesthesia history, she reported "coding" during her tubal ligation and hysterectomy and that it took her "three days to wake up" after her hernia repair. She said her sister has had similar reactions, but does not recall ever being told much about the etiologies her their anesthesia reactions. The terms malignant hyperthermia and pseudocholinesterase deficiency do not sound familiar to her.  This is what she could tell me about her three previous surgeries, although admittedly she doesn't remember many details herself and is only able to recall what little information she was told by her doctors or ex-husband: - 08-17-1984 tubal ligation: "coded" during surgery; done with "IV and mask"; [facility] in [place], now closed) - 1994 hernia repair: took "three days" to wake up after surgery [facility] in [place], now closed) 08/17/2004 hysterectomy: "coded"; was suppose to go home that same day but had to stay ~ 3 days. Said procedure was done with "IV and mask" anesthesia. She did not recall having to be intubated or having chest compressions. She does not recall being told she had a fever. She denied having a heart attack or having to see a cardiologist. She denied prior cardiac testing other than EKGs. ([facility] in [place]. Records requested urgently.)   PCP is Dr. Everardo Beals.  Hem-Onc is Dr. Jana Hakim. Rad-Onc is Dr. Lisbeth Renshaw.  Meds include MVI, Buffered aspirin (on hold).  PAT Vitals: BP 140/61, HR 74, RR 18, T 36.8C, O2 sat 100%. Heart RRR, no murmur noted. Lungs clear. No carotid bruits noted. No ankle edema. Good mouth opening. Mallampati II. She denied chest pain, SOB, edema, syncope (other than once in the 90's associated with a migraine headache with subsequent hospitalization with reportedly "normal" work-up). She has required blood with surgeries in the past, but denied known bleeding disorder such as hemophilia.   08/19/15 EKG: NSR with sinus arrhythmia.  Preoperative labs noted. CBC and BMET WNL.  [facility] records still pending. I did review known information with anesthesiologist Dr. Linna Caprice. He will plan to evaluate on the day of surgery. Hopefully records will be available by then. She says she can not lie flat due to SOB and that a "crash cart" is always kept near by when she has surgery.  George Hugh [facility] Short Stay Center/Anesthesiology Phone [phone] 08/19/2015 3:10 PM  Addendum: Jinny Blossom in Medical Records [phone]) from [facility] called. Records > 5 years ago are in storage, so she cannot guarantee that they will be available by tomorrow. I updated Dr. Linna Caprice.  George Hugh [facility] Short Stay Center/Anesthesiology Phone [phone] 08/19/2015 3:31 PM  Addendum: I updated anesthesiologist Dr. Tamala Julian about her anesthesia history. She will be further evaluated by her anesthesia team this morning.  George Hugh [facility] Short Stay Center/Anesthesiology Phone [phone] 08/20/2015 10:28 AM

## 2015-08-19 NOTE — Pre-Procedure Instructions (Signed)
Gloria Lang  08/19/2015      [id number] - [place], [place] - [address] AT [place] [address] Phone: [phone] Fax: [phone]    Your procedure is scheduled on Thursday April 13th.  Report to [facility] Admitting at [phone] A.M.  Call this number if you have problems the morning of surgery:  [phone]   Remember:  Do not eat food or drink liquids after midnight tonight.  Take these medicines the morning of surgery with A SIP OF WATER none  Do not take your multivitamin or aspirin until after surgery when the doctor tells you it's okay to resume your medications.   Do not wear jewelry, make-up or nail polish.  Do not wear lotions, powders, or perfumes.  You may wear deodorant.  Do not shave 48 hours prior to surgery.  Men may shave face and neck.  Do not bring valuables to the hospital.  [facility] is not responsible for any belongings or valuables.  Contacts, dentures or bridgework may not be worn into surgery.  Leave your suitcase in the car.  After surgery it may be brought to your room.  For patients admitted to the hospital, discharge time will be determined by your treatment team.  Patients discharged the day of surgery will not be allowed to drive home.   Special instructions:       Preparing for Surgery at [facility]  Before surgery, you can play an important role.  Because skin is not sterile, your skin needs to be as free of germs as possible.  You can reduce the number of germs on your skin by washing with CHG (chlorahexidine gluconate) Soap before surgery.  CHG is an antiseptic cleaner with kills germs and bonds with the skin to continue killing germs even after washing.   Please do not use if you have an allergy to CHG or antibacterial soaps.  If your skin becomes reddened/irritated stop using the CHG.  Do not shave (including legs and underarms) for at least 48 hours  prior to first CHG shower.  It is okay to shave your face.  Please follow these instructions carefully:  1. Shower with CHG Soap the night before surgery and the morning of Surgery. 2. If you choose to wash your hair, wash your hair first as usual with your normal shampoo. 3. After you shampoo, rinse your hair and body thoroughly to remove the Shampoo. 4. Use CHG as you would any other liquid soap. You can apply chg directly to the skin and wash gently with scrungie or a clean washcloth. 5. Apply the CHG Soap to your body ONLY FROM THE NECK DOWN. Do not use on open wounds or open sores. Avoid contact with your eyes, ears, mouth and genitals (private parts). Wash genitals (private parts) with your normal soap. 6. Wash thoroughly, paying special attention to the area where your surgery will be performed. 7. Thoroughly rinse your body with warm water from the neck down. 8. DO NOT shower/wash with your normal soap after using and rinsing off the CHG Soap. 9. Pat yourself dry with a clean towel.  10. Wear clean pajamas.  11. Place clean sheets on your bed the night of your first shower and do not sleep with pets.  Day of Surgery  Do not apply any lotions/deodorants the morning of surgery. Please wear clean clothes to the hospital/surgery center.  Please read over the following fact sheets that you were given. Pain Booklet, Coughing and Deep Breathing and Surgical Site Infection Prevention

## 2015-08-20 ENCOUNTER — Ambulatory Visit (HOSPITAL_COMMUNITY)
Admission: RE | Admit: 2015-08-20 | Discharge: 2015-08-20 | Disposition: A | Discharge status: Home / Self Care | Payer: BLUE CROSS/BLUE SHIELD | Source: Ambulatory Visit | Attending: General Surgery | Admitting: General Surgery

## 2015-08-20 ENCOUNTER — Encounter (HOSPITAL_COMMUNITY)
Admission: RE | Disposition: A | Discharge status: Home / Self Care | Payer: Self-pay | Source: Ambulatory Visit | Attending: General Surgery

## 2015-08-20 ENCOUNTER — Ambulatory Visit (HOSPITAL_COMMUNITY): Payer: BLUE CROSS/BLUE SHIELD

## 2015-08-20 ENCOUNTER — Encounter (HOSPITAL_COMMUNITY): Payer: BLUE CROSS/BLUE SHIELD

## 2015-08-20 ENCOUNTER — Ambulatory Visit (HOSPITAL_COMMUNITY): Payer: BLUE CROSS/BLUE SHIELD | Admitting: Anesthesiology

## 2015-08-20 ENCOUNTER — Encounter: Payer: Self-pay | Admitting: *Deleted

## 2015-08-20 ENCOUNTER — Other Ambulatory Visit: Payer: Self-pay | Admitting: *Deleted

## 2015-08-20 ENCOUNTER — Ambulatory Visit (HOSPITAL_COMMUNITY): Payer: BLUE CROSS/BLUE SHIELD | Admitting: Vascular Surgery

## 2015-08-20 ENCOUNTER — Encounter (HOSPITAL_COMMUNITY): Payer: Self-pay | Admitting: Anesthesiology

## 2015-08-20 DIAGNOSIS — C50412 Malignant neoplasm of upper-outer quadrant of left female breast: Secondary | ICD-10-CM

## 2015-08-20 DIAGNOSIS — G4733 Obstructive sleep apnea (adult) (pediatric): Secondary | ICD-10-CM | POA: Insufficient documentation

## 2015-08-20 DIAGNOSIS — Z95828 Presence of other vascular implants and grafts: Secondary | ICD-10-CM

## 2015-08-20 DIAGNOSIS — J939 Pneumothorax, unspecified: Secondary | ICD-10-CM

## 2015-08-20 DIAGNOSIS — C50912 Malignant neoplasm of unspecified site of left female breast: Secondary | ICD-10-CM

## 2015-08-20 DIAGNOSIS — Z171 Estrogen receptor negative status [ER-]: Secondary | ICD-10-CM | POA: Insufficient documentation

## 2015-08-20 HISTORY — PX: PORTACATH PLACEMENT: SHX2246

## 2015-08-20 SURGERY — INSERTION, TUNNELED CENTRAL VENOUS DEVICE, WITH PORT
Anesthesia: General | Site: Chest

## 2015-08-20 MED ORDER — HEPARIN SOD (PORK) LOCK FLUSH 100 UNIT/ML IV SOLN
INTRAVENOUS | Status: DC | PRN
  Administered 2015-08-20: 500 [IU] via INTRAVENOUS

## 2015-08-20 MED ORDER — LIDOCAINE HCL (CARDIAC) 20 MG/ML IV SOLN
INTRAVENOUS | Status: AC
  Filled 2015-08-20: qty 5

## 2015-08-20 MED ORDER — ACETAMINOPHEN 325 MG PO TABS
650.0000 mg | ORAL_TABLET | ORAL | Status: DC | PRN
  Administered 2015-08-20: 650 mg via ORAL

## 2015-08-20 MED ORDER — LIDOCAINE HCL (CARDIAC) 20 MG/ML IV SOLN
INTRAVENOUS | Status: DC | PRN
  Administered 2015-08-20: 100 mg via INTRAVENOUS

## 2015-08-20 MED ORDER — SODIUM CHLORIDE 0.9 % IV SOLN
INTRAVENOUS | Status: DC | PRN
  Administered 2015-08-20: 12:00:00

## 2015-08-20 MED ORDER — ACETAMINOPHEN 325 MG PO TABS
ORAL_TABLET | ORAL | Status: AC
  Administered 2015-08-20: 650 mg via ORAL
  Filled 2015-08-20: qty 2

## 2015-08-20 MED ORDER — SODIUM CHLORIDE 0.9 % IV SOLN: 250.0000 mL | INTRAVENOUS | Status: DC | PRN

## 2015-08-20 MED ORDER — PROPOFOL 10 MG/ML IV BOLUS
INTRAVENOUS | Status: DC | PRN
  Administered 2015-08-20: 150 mg via INTRAVENOUS

## 2015-08-20 MED ORDER — BUPIVACAINE-EPINEPHRINE (PF) 0.25% -1:200000 IJ SOLN
INTRAMUSCULAR | Status: AC
  Filled 2015-08-20: qty 30

## 2015-08-20 MED ORDER — LIDOCAINE HCL 1 % IJ SOLN
INTRAMUSCULAR | Status: DC | PRN
  Administered 2015-08-20: 10 mL

## 2015-08-20 MED ORDER — ONDANSETRON HCL 4 MG/2ML IJ SOLN
INTRAMUSCULAR | Status: AC
  Filled 2015-08-20: qty 2

## 2015-08-20 MED ORDER — ONDANSETRON HCL 4 MG/2ML IJ SOLN: 4.0000 mg | Freq: Once | INTRAMUSCULAR | Status: DC | PRN

## 2015-08-20 MED ORDER — HYDROMORPHONE HCL 1 MG/ML IJ SOLN: 0.5000 mg | INTRAMUSCULAR | Status: DC | PRN

## 2015-08-20 MED ORDER — OXYCODONE-ACETAMINOPHEN 5-325 MG PO TABS: 1.0000 | ORAL_TABLET | ORAL | Status: DC | PRN

## 2015-08-20 MED ORDER — OXYCODONE HCL 5 MG PO TABS: 5.0000 mg | ORAL_TABLET | ORAL | Status: DC | PRN

## 2015-08-20 MED ORDER — LACTATED RINGERS IV SOLN
INTRAVENOUS | Status: DC
  Administered 2015-08-20: 12:00:00 via INTRAVENOUS

## 2015-08-20 MED ORDER — ONDANSETRON HCL 4 MG/2ML IJ SOLN
INTRAMUSCULAR | Status: DC | PRN
  Administered 2015-08-20: 4 mg via INTRAVENOUS

## 2015-08-20 MED ORDER — ACETAMINOPHEN 650 MG RE SUPP: 650.0000 mg | RECTAL | Status: DC | PRN

## 2015-08-20 MED ORDER — LIDOCAINE HCL (PF) 1 % IJ SOLN
INTRAMUSCULAR | Status: AC
  Filled 2015-08-20: qty 30

## 2015-08-20 MED ORDER — OXYCODONE-ACETAMINOPHEN 5-325 MG PO TABS
1.0000 | ORAL_TABLET | Freq: Once | ORAL | Status: AC
Start: 2015-08-20 — End: 2015-08-20
  Administered 2015-08-20: 1 via ORAL

## 2015-08-20 MED ORDER — HEPARIN SOD (PORK) LOCK FLUSH 100 UNIT/ML IV SOLN
INTRAVENOUS | Status: AC
  Filled 2015-08-20: qty 5

## 2015-08-20 MED ORDER — 0.9 % SODIUM CHLORIDE (POUR BTL) OPTIME
TOPICAL | Status: DC | PRN
  Administered 2015-08-20: 1000 mL

## 2015-08-20 MED ORDER — FENTANYL CITRATE (PF) 250 MCG/5ML IJ SOLN
INTRAMUSCULAR | Status: AC
  Filled 2015-08-20: qty 5

## 2015-08-20 MED ORDER — OXYCODONE-ACETAMINOPHEN 5-325 MG PO TABS
ORAL_TABLET | ORAL | Status: AC
  Filled 2015-08-20: qty 1

## 2015-08-20 MED ORDER — SODIUM CHLORIDE 0.9% FLUSH: 3.0000 mL | INTRAVENOUS | Status: DC | PRN

## 2015-08-20 MED ORDER — SODIUM CHLORIDE 0.9% FLUSH: 3.0000 mL | Freq: Two times a day (BID) | INTRAVENOUS | Status: DC

## 2015-08-20 MED ORDER — PROPOFOL 10 MG/ML IV BOLUS
INTRAVENOUS | Status: AC
  Filled 2015-08-20: qty 20

## 2015-08-20 SURGICAL SUPPLY — 46 items
BAG DECANTER FOR FLEXI CONT (MISCELLANEOUS) ×3 IMPLANT
BLADE SURG 11 STRL SS (BLADE) ×3 IMPLANT
BLADE SURG 15 STRL LF DISP TIS (BLADE) ×1 IMPLANT
BLADE SURG 15 STRL SS (BLADE) ×2
CANISTER SUCTION 2500CC (MISCELLANEOUS) IMPLANT
CHLORAPREP W/TINT 10.5 ML (MISCELLANEOUS) ×3 IMPLANT
COVER SURGICAL LIGHT HANDLE (MISCELLANEOUS) ×3 IMPLANT
COVER TRANSDUCER ULTRASND GEL (DRAPE) IMPLANT
CRADLE DONUT ADULT HEAD (MISCELLANEOUS) ×3 IMPLANT
DECANTER SPIKE VIAL GLASS SM (MISCELLANEOUS) ×6 IMPLANT
DRAPE C-ARM 42X72 X-RAY (DRAPES) ×3 IMPLANT
DRAPE CHEST BREAST 15X10 FENES (DRAPES) ×3 IMPLANT
DRAPE UTILITY XL STRL (DRAPES) ×6 IMPLANT
DRAPE WARM FLUID 44X44 (DRAPE) IMPLANT
ELECT COATED BLADE 2.86 ST (ELECTRODE) ×3 IMPLANT
ELECT REM PT RETURN 9FT ADLT (ELECTROSURGICAL) ×3
ELECTRODE REM PT RTRN 9FT ADLT (ELECTROSURGICAL) ×1 IMPLANT
GAUZE SPONGE 4X4 16PLY XRAY LF (GAUZE/BANDAGES/DRESSINGS) ×3 IMPLANT
GEL ULTRASOUND 20GR AQUASONIC (MISCELLANEOUS) IMPLANT
GLOVE BIO SURGEON STRL SZ 6 (GLOVE) ×3 IMPLANT
GLOVE BIOGEL PI IND STRL 6.5 (GLOVE) ×1 IMPLANT
GLOVE BIOGEL PI INDICATOR 6.5 (GLOVE) ×2
GOWN STRL REUS W/ TWL LRG LVL3 (GOWN DISPOSABLE) ×1 IMPLANT
GOWN STRL REUS W/TWL 2XL LVL3 (GOWN DISPOSABLE) ×3 IMPLANT
GOWN STRL REUS W/TWL LRG LVL3 (GOWN DISPOSABLE) ×2
KIT BASIN OR (CUSTOM PROCEDURE TRAY) ×3 IMPLANT
KIT PORT POWER 8FR ISP CVUE (Catheter) ×3 IMPLANT
KIT ROOM TURNOVER OR (KITS) ×3 IMPLANT
LIQUID BAND (GAUZE/BANDAGES/DRESSINGS) ×3 IMPLANT
NEEDLE HYPO 25GX1X1/2 BEV (NEEDLE) ×3 IMPLANT
NS IRRIG 1000ML POUR BTL (IV SOLUTION) ×3 IMPLANT
PACK SURGICAL SETUP 50X90 (CUSTOM PROCEDURE TRAY) ×3 IMPLANT
PAD ARMBOARD 7.5X6 YLW CONV (MISCELLANEOUS) ×3 IMPLANT
PENCIL BUTTON HOLSTER BLD 10FT (ELECTRODE) ×3 IMPLANT
SUT MON AB 4-0 PC3 18 (SUTURE) ×3 IMPLANT
SUT PROLENE 2 0 SH DA (SUTURE) ×6 IMPLANT
SUT VIC AB 3-0 SH 27 (SUTURE) ×2
SUT VIC AB 3-0 SH 27X BRD (SUTURE) ×1 IMPLANT
SYR 20ML ECCENTRIC (SYRINGE) ×6 IMPLANT
SYR 5ML LUER SLIP (SYRINGE) ×3 IMPLANT
SYR CONTROL 10ML LL (SYRINGE) ×3 IMPLANT
TOWEL OR 17X24 6PK STRL BLUE (TOWEL DISPOSABLE) ×3 IMPLANT
TOWEL OR 17X26 10 PK STRL BLUE (TOWEL DISPOSABLE) ×3 IMPLANT
TUBE CONNECTING 12'X1/4 (SUCTIONS)
TUBE CONNECTING 12X1/4 (SUCTIONS) IMPLANT
YANKAUER SUCT BULB TIP NO VENT (SUCTIONS) IMPLANT

## 2015-08-20 NOTE — Anesthesia Procedure Notes (Signed)
Procedure Name: LMA Insertion Date/Time: 08/20/2015 11:48 AM Performed by: Barkley Boards L Pre-anesthesia Checklist: Patient identified, Emergency Drugs available, Suction available and Patient being monitored Patient Re-evaluated:Patient Re-evaluated prior to inductionOxygen Delivery Method: Circle system utilized Preoxygenation: Pre-oxygenation with 100% oxygen Intubation Type: IV induction LMA: LMA inserted LMA Size: 4.0 Dental Injury: Teeth and Oropharynx as per pre-operative assessment

## 2015-08-20 NOTE — Transfer of Care (Signed)
Immediate Anesthesia Transfer of Care Note  Patient: Gloria Lang  Procedure(s) Performed: Procedure(s): INSERTION PORT-A-CATH (N/A)  Patient Location: PACU  Anesthesia Type:General  Level of Consciousness: awake  Airway & Oxygen Therapy: Patient Spontanous Breathing and Patient connected to nasal cannula oxygen  Post-op Assessment: Report given to RN and Post -op Vital signs reviewed and stable  Post vital signs: stable  Last Vitals:  Filed Vitals:   08/20/15 1037 08/20/15 1236  BP: 151/66   Pulse: 94   Temp: 37.1 C 36.7 C  Resp: 18     Complications: No apparent anesthesia complications

## 2015-08-20 NOTE — Progress Notes (Signed)
Accept pt for care. VSS, l anterior chest site CDI, denies pain, CXR due 1700.

## 2015-08-20 NOTE — Progress Notes (Signed)
Left floor for cxr and now has returned.

## 2015-08-20 NOTE — Anesthesia Postprocedure Evaluation (Signed)
Anesthesia Post Note  Patient: Gloria Lang  Procedure(s) Performed: Procedure(s) (LRB): INSERTION PORT-A-CATH (N/A)  Patient location during evaluation: PACU Anesthesia Type: General Level of consciousness: awake, awake and alert, oriented and patient cooperative Pain management: pain level controlled Vital Signs Assessment: post-procedure vital signs reviewed and stable Respiratory status: spontaneous breathing and respiratory function stable Cardiovascular status: blood pressure returned to baseline and stable Anesthetic complications: no    Last Vitals:  Filed Vitals:   08/20/15 1251 08/20/15 1306  BP: 148/91 156/88  Pulse: 78 72  Temp:    Resp: 15 15    Last Pain:  Filed Vitals:   08/20/15 1320  PainSc: 2                  Karem Tomaso EDWARD

## 2015-08-20 NOTE — Discharge Instructions (Signed)
[facility] °Office Phone Number [phone] ° ° POST OP INSTRUCTIONS ° °Always review your discharge instruction sheet given to you by the facility where your surgery was performed. ° °IF YOU HAVE DISABILITY OR FAMILY LEAVE FORMS, YOU MUST BRING THEM TO THE OFFICE FOR PROCESSING.  DO NOT GIVE THEM TO YOUR DOCTOR. ° °1. A prescription for pain medication may be given to you upon discharge.  Take your pain medication as prescribed, if needed.  If narcotic pain medicine is not needed, then you may take acetaminophen (Tylenol) or ibuprofen (Advil) as needed. °2. Take your usually prescribed medications unless otherwise directed °3. If you need a refill on your pain medication, please contact your pharmacy.  They will contact our office to request authorization.  Prescriptions will not be filled after 5pm or on week-ends. °4. You should eat very light the first 24 hours after surgery, such as soup, crackers, pudding, etc.  Resume your normal diet the day after surgery °5. It is common to experience some constipation if taking pain medication after surgery.  Increasing fluid intake and taking a stool softener will usually help or prevent this problem from occurring.  A mild laxative (Milk of Magnesia or Miralax) should be taken according to package directions if there are no bowel movements after 48 hours. °6. You may shower in 48 hours.  The surgical glue will flake off in 2-3 weeks.   °7. ACTIVITIES:  No strenuous activity or heavy lifting for 1 week.   °a. You may drive when you no longer are taking prescription pain medication, you can comfortably wear a seatbelt, and you can safely maneuver your car and apply brakes. °b. RETURN TO WORK:  __________to be determined._______________ °You should see your doctor in the office for a follow-up appointment approximately three-four weeks after your surgery.   ° °WHEN TO CALL YOUR DOCTOR: °1. Fever over 101.0 °2. Nausea and/or vomiting. °3. Extreme swelling  or bruising. °4. Continued bleeding from incision. °5. Increased pain, redness, or drainage from the incision. ° °The clinic staff is available to answer your questions during regular business hours.  Please don’t hesitate to call and ask to speak to one of the nurses for clinical concerns.  If you have a medical emergency, go to the nearest emergency room or call 911.  A surgeon from [facility] is always on call at the hospital. ° °For further questions, please visit [email or web address]  ° °

## 2015-08-20 NOTE — Anesthesia Preprocedure Evaluation (Addendum)
Anesthesia Evaluation  Patient identified by MRN, date of birth, ID band Patient awake    Reviewed: Allergy & Precautions, NPO status , Patient's Chart, lab work & pertinent test results  Airway Mallampati: I  TM Distance: >3 FB Neck ROM: Full    Dental   Pulmonary shortness of breath, asthma , sleep apnea ,    Pulmonary exam normal        Cardiovascular Normal cardiovascular exam     Neuro/Psych    GI/Hepatic hiatal hernia, GERD  ,  Endo/Other    Renal/GU      Musculoskeletal  (+) Arthritis ,   Abdominal   Peds  Hematology   Anesthesia Other Findings   Reproductive/Obstetrics                             Anesthesia Physical Anesthesia Plan  ASA: II  Anesthesia Plan: General   Post-op Pain Management:    Induction: Intravenous  Airway Management Planned: LMA  Additional Equipment:   Intra-op Plan:   Post-operative Plan: Extubation in OR  Informed Consent: I have reviewed the patients History and Physical, chart, labs and discussed the procedure including the risks, benefits and alternatives for the proposed anesthesia with the patient or authorized representative who has indicated his/her understanding and acceptance.     Plan Discussed with: CRNA, Anesthesiologist and Surgeon  Anesthesia Plan Comments:        Anesthesia Quick Evaluation

## 2015-08-20 NOTE — H&P (Signed)
Gloria Lang is an 55 y.o. female.   Chief Complaint: left breast cancer HPI:  Patient presents with a palpable left breast mass.  Biopsy of breast and LN show invasive ductal carcinoma, grade 2 or 3, triple negative, with an MIB-1 between 10 and 60%.  Patient will benefit from neoadjuvant chemotherapy.    Past Medical History  Diagnosis Date  . Breast cancer of upper-outer quadrant of left female breast ([place]) 08/07/2015  . Anemia   . Hot flashes   . Family history of adverse reaction to anesthesia     younger sister has same issues with anesthesia  . Complication of anesthesia     patient states she has coded multiple times before with anesthesia. her sister has similar issues.   . Shortness of breath dyspnea     if patient's head is flat (per pt)  . Asthma     only in the summer time- uses albuterol inhaler  . GERD (gastroesophageal reflux disease)   . History of hiatal hernia     had hiatal hernia repair in '94 (per patient)  . Arthritis   . Hx of peptic ulcer   . Hx of transfusion of packed red blood cells   . Sleep apnea     couldn't tolerate CPAP d/t claustrophobia; sleep upright "90 degree angle": study done at [facility] several years ago    Past Surgical History  Procedure Laterality Date  . Abdominal hysterectomy    . Tubal ligation    . Hernia repair    . Colonoscopy    . Upper gi endoscopy      Family History  Problem Relation Age of Onset  . Colon cancer Brother 88  . Throat cancer Maternal Grandfather   . Lung cancer Maternal Uncle   . Ovarian cancer Sister 62    suspected but not pathologically confirmed  . Lung cancer Paternal Uncle   . Breast cancer Maternal Aunt 60   Social History:  reports that she has never smoked. She does not have any smokeless tobacco history on file. She reports that she does not drink alcohol or use illicit drugs.  Allergies: No Known Allergies  Medications Prior to Admission  Medication Sig Dispense Refill  .  Aspirin Buf,CaCarb-MgCarb-MgO, (BUFFERED ASPIRIN) 325 MG TABS Take 650 tablets by mouth 2 (two) times daily as needed. For pain    . Multiple Vitamin (MULTIVITAMIN WITH MINERALS) TABS tablet Take 1 tablet by mouth daily.      Results for orders placed or performed during the hospital encounter of 08/19/15 (from the past 48 hour(s))  CBC     Status: None   Collection Time: 08/19/15  1:41 PM  Result Value Ref Range   WBC 6.7 4.0 - 10.5 K/uL   RBC 4.45 3.87 - 5.11 MIL/uL   Hemoglobin 13.5 12.0 - 15.0 g/dL   HCT 41.1 36.0 - 46.0 %   MCV 92.4 78.0 - 100.0 fL   MCH 30.3 26.0 - 34.0 pg   MCHC 32.8 30.0 - 36.0 g/dL   RDW 13.0 11.5 - 15.5 %   Platelets 307 150 - 400 K/uL  Basic metabolic panel     Status: None   Collection Time: 08/19/15  2:17 PM  Result Value Ref Range   Sodium 142 135 - 145 mmol/L   Potassium 4.9 3.5 - 5.1 mmol/L   Chloride 106 101 - 111 mmol/L   CO2 26 22 - 32 mmol/L   Glucose, Bld 82 65 - 99  mg/dL   BUN 13 6 - 20 mg/dL   Creatinine, Ser 0.90 0.44 - 1.00 mg/dL   Calcium 10.1 8.9 - 10.3 mg/dL   GFR calc non Af Amer >60 >60 mL/min   GFR calc Af Amer >60 >60 mL/min    Comment: (NOTE) The eGFR has been calculated using the CKD EPI equation. This calculation has not been validated in all clinical situations. eGFR's persistently <60 mL/min signify possible Chronic Kidney Disease.    Anion gap 10 5 - 15   Mr Breast Bilateral W Wo Contrast  08/19/2015  CLINICAL DATA:  Biopsy proven grade 2 invasive mammary carcinoma in the 1:30 o'clock location of the left breast. Metastatic mammary carcinoma biopsied from 1 axillary lymph node. LABS:  None obtained at the time of imaging. EXAM: BILATERAL BREAST MRI WITH AND WITHOUT CONTRAST TECHNIQUE: Multiplanar, multisequence MR images of both breasts were obtained prior to and following the intravenous administration of 13 ml of MultiHance. THREE-DIMENSIONAL MR IMAGE RENDERING ON INDEPENDENT WORKSTATION: Three-dimensional MR images were  rendered by post-processing of the original MR data on an independent workstation. The three-dimensional MR images were interpreted, and findings are reported in the following complete MRI report for this study. Three dimensional images were evaluated at the independent DynaCad workstation COMPARISON:  Previous exam(s). FINDINGS: Breast composition: c. Heterogeneous fibroglandular tissue. Background parenchymal enhancement: Moderate. Right breast: No mass or abnormal enhancement. Left breast: There is a 7.6 x 4.2 x 2.3 (anterior posterior, transverse and craniocaudal dimensions) irregular enhancing mass in the middle and posterior thirds of the lateral aspect of the left breast corresponding with the biopsy proven invasive mammary carcinoma. Signal void artifact from the biopsy clip is seen in the posterior aspect of the enhancing mass. Lymph nodes: Several enlarged level 1 left axillary lymph nodes are visualized. One of them has biopsy changes and a signal void artifact from the biopsy clip. The largest lymph node measures 1.6 cm. Ancillary findings:  None. IMPRESSION: 7.6 cm enhancing irregular mass in the lateral aspect of the left breast corresponding with the biopsy proven invasive mammary carcinoma. Enlarged left axillary adenopathy corresponding with the known metastatic disease. RECOMMENDATION: Treatment planning of the known left breast invasive mammary carcinoma and metastatic axillary adenopathy is recommended. The surgical clip is located along the posterior aspect of the carcinoma. If pathologically proving the extent of disease is clinically indicated the anterior aspect of the enhancing mass could be biopsied with MR guidance. BI-RADS CATEGORY  6: Known biopsy-proven malignancy. Electronically Signed   By: Lillia Mountain M.D.   On: 08/19/2015 11:34    Review of Systems  All other systems reviewed and are negative.   There were no vitals taken for this visit. Physical Exam  Constitutional: She  is oriented to person, place, and time. She appears well-developed and well-nourished. No distress.  HENT:  Head: Normocephalic and atraumatic.  Eyes: Conjunctivae are normal. Pupils are equal, round, and reactive to light. No scleral icterus.  Neck: Neck supple. No thyromegaly present.  Cardiovascular: Normal rate.   Respiratory: Effort normal. No respiratory distress.  Palpable left breast mass  GI: Soft. She exhibits no distension.  Musculoskeletal: Normal range of motion.  Neurological: She is alert and oriented to person, place, and time.  Skin: Skin is warm and dry. No rash noted. She is not diaphoretic. No erythema. No pallor.  Psychiatric: She has a normal mood and affect. Her behavior is normal. Judgment and thought content normal.     Assessment/Plan Left  breast cancer, cT2N1 (IIB) Plan neoadjuvant chemotherapy  Discussed risks/benefits of surgery. Reviewed risks of pneumothorax, bleeding, infection, port malfunction, possible need for additional surgeries or procedures.   Stark Klein, MD 08/20/2015, 10:26 AM

## 2015-08-20 NOTE — Op Note (Signed)
PREOPERATIVE DIAGNOSIS:  Left breast cancer     POSTOPERATIVE DIAGNOSIS:  Same     PROCEDURE: Left subclavian port placement, Bard ClearVue  Power Port, MRI safe, 8-French.      SURGEON:  Stark Klein, MD      ANESTHESIA:  General   FINDINGS:  Good venous return, easy flush, and tip of the catheter and   SVC 23 cm.      SPECIMEN:  None.      ESTIMATED BLOOD LOSS:  Minimal.      COMPLICATIONS:  None known.      PROCEDURE:  Pt was identified in the holding area and taken to   the operating room, where patient was placed supine on the operating room   table.  General anesthesia was induced.  Patient's arms were tucked and the upper   chest and neck were prepped and draped in sterile fashion.  Time-out was   performed according to the surgical safety check list.  When all was   correct, we continued.   Local anesthetic was administered over this   area at the angle of the clavicle.  The vein was accessed with 2 passes of the needle. There was good venous return and the wire passed easily with no ectopy.   Fluoroscopy was used to confirm that the wire was in the vena cava.      The patient was placed back level and the area for the pocket was anethetized   with local anesthetic.  A 3-cm transverse incision was made with a #15   blade.  Cautery was used to divide the subcutaneous tissues down to the   pectoralis muscle.  An [employer] retractor was used to elevate the skin   while a pocket was created on top of the pectoralis fascia.  The port   was placed into the pocket to confirm that it was of adequate size.  The   catheter was preattached to the port.  The port was then secured to the   pectoralis fascia with four 2-0 Prolene sutures.  These were clamped and   not tied down yet.    The catheter was tunneled through to the wire exit   site.  The catheter was placed along the wire to determine what length it should be to be in the SVC.  The catheter was cut at 23 cm.  The tunneler  sheath and dilator were passed over the wire and the dilator and wire were removed.  The catheter was advanced through the tunneler sheath and the tunneler sheath was pulled away.  Care was taken to keep the catheter in the tunneler sheath as this occurred. This was advanced and the tunneler sheath was removed.  There was good venous   return and easy flush of the catheter.  The Prolene sutures were tied   down to the pectoral fascia.  The skin was reapproximated using 3-0   Vicryl interrupted deep dermal sutures.    Fluoroscopy was used to re-confirm good position of the catheter.  The skin   was then closed using 4-0 Monocryl in a subcuticular fashion.  The port was flushed with concentrated heparin flush as well.  The wounds were then cleaned, dried, and dressed with Dermabond.  The patient was awakened from anesthesia and taken to the PACU in stable condition.  Needle, sponge, and instrument counts were correct.               Stark Klein, MD

## 2015-08-21 ENCOUNTER — Encounter: Payer: Self-pay | Admitting: Nurse Practitioner

## 2015-08-21 ENCOUNTER — Ambulatory Visit (HOSPITAL_BASED_OUTPATIENT_CLINIC_OR_DEPARTMENT_OTHER): Payer: BLUE CROSS/BLUE SHIELD | Admitting: Nurse Practitioner

## 2015-08-21 ENCOUNTER — Encounter: Payer: Self-pay | Admitting: Oncology

## 2015-08-21 ENCOUNTER — Other Ambulatory Visit (HOSPITAL_BASED_OUTPATIENT_CLINIC_OR_DEPARTMENT_OTHER): Payer: BLUE CROSS/BLUE SHIELD

## 2015-08-21 ENCOUNTER — Telehealth: Payer: Self-pay | Admitting: Nurse Practitioner

## 2015-08-21 ENCOUNTER — Other Ambulatory Visit: Payer: BLUE CROSS/BLUE SHIELD

## 2015-08-21 DIAGNOSIS — C50412 Malignant neoplasm of upper-outer quadrant of left female breast: Secondary | ICD-10-CM

## 2015-08-21 DIAGNOSIS — G4700 Insomnia, unspecified: Secondary | ICD-10-CM

## 2015-08-21 DIAGNOSIS — M545 Low back pain: Secondary | ICD-10-CM | POA: Diagnosis not present

## 2015-08-21 DIAGNOSIS — C773 Secondary and unspecified malignant neoplasm of axilla and upper limb lymph nodes: Secondary | ICD-10-CM

## 2015-08-21 DIAGNOSIS — Z170 Estrogen receptor positive status [ER+]: Secondary | ICD-10-CM | POA: Diagnosis not present

## 2015-08-21 DIAGNOSIS — M2550 Pain in unspecified joint: Secondary | ICD-10-CM

## 2015-08-21 LAB — CBC WITH DIFFERENTIAL/PLATELET
BASO%: 0.7 % (ref 0.0–2.0)
BASOS ABS: 0 10*3/uL (ref 0.0–0.1)
EOS ABS: 0.1 10*3/uL (ref 0.0–0.5)
EOS%: 1.5 % (ref 0.0–7.0)
HEMATOCRIT: 39.4 % (ref 34.8–46.6)
HEMOGLOBIN: 12.9 g/dL (ref 11.6–15.9)
LYMPH#: 2.4 10*3/uL (ref 0.9–3.3)
LYMPH%: 35.6 % (ref 14.0–49.7)
MCH: 30.5 pg (ref 25.1–34.0)
MCHC: 32.8 g/dL (ref 31.5–36.0)
MCV: 93.1 fL (ref 79.5–101.0)
MONO#: 0.5 10*3/uL (ref 0.1–0.9)
MONO%: 7.6 % (ref 0.0–14.0)
NEUT%: 54.6 % (ref 38.4–76.8)
NEUTROS ABS: 3.7 10*3/uL (ref 1.5–6.5)
Platelets: 262 10*3/uL (ref 145–400)
RBC: 4.24 10*6/uL (ref 3.70–5.45)
RDW: 12.9 % (ref 11.2–14.5)
WBC: 6.8 10*3/uL (ref 3.9–10.3)

## 2015-08-21 LAB — COMPREHENSIVE METABOLIC PANEL
ALBUMIN: 3.8 g/dL (ref 3.5–5.0)
ALK PHOS: 47 U/L (ref 40–150)
ALT: 11 U/L (ref 0–55)
AST: 20 U/L (ref 5–34)
Anion Gap: 8 mEq/L (ref 3–11)
BUN: 9.9 mg/dL (ref 7.0–26.0)
CALCIUM: 9.5 mg/dL (ref 8.4–10.4)
CO2: 28 mEq/L (ref 22–29)
CREATININE: 0.9 mg/dL (ref 0.6–1.1)
Chloride: 105 mEq/L (ref 98–109)
EGFR: 84 mL/min/{1.73_m2} — ABNORMAL LOW (ref >90)
GLUCOSE: 96 mg/dL (ref 70–140)
POTASSIUM: 3.8 meq/L (ref 3.5–5.1)
SODIUM: 141 meq/L (ref 136–145)
Total Bilirubin: 0.31 mg/dL (ref 0.20–1.20)
Total Protein: 6.7 g/dL (ref 6.4–8.3)

## 2015-08-21 MED ORDER — DEXAMETHASONE 4 MG PO TABS: ORAL_TABLET | ORAL | Status: DC

## 2015-08-21 MED ORDER — LIDOCAINE-PRILOCAINE 2.5-2.5 % EX CREA: TOPICAL_CREAM | CUTANEOUS | Status: AC

## 2015-08-21 MED ORDER — LORATADINE 10 MG PO TABS: 10.0000 mg | ORAL_TABLET | Freq: Every day | ORAL | Status: AC

## 2015-08-21 MED ORDER — PROCHLORPERAZINE MALEATE 10 MG PO TABS: 10.0000 mg | ORAL_TABLET | Freq: Four times a day (QID) | ORAL | Status: DC | PRN

## 2015-08-21 MED ORDER — ONDANSETRON HCL 8 MG PO TABS: 8.0000 mg | ORAL_TABLET | Freq: Two times a day (BID) | ORAL | Status: AC | PRN

## 2015-08-21 MED ORDER — LORAZEPAM 0.5 MG PO TABS: 0.5000 mg | ORAL_TABLET | Freq: Four times a day (QID) | ORAL | Status: DC | PRN

## 2015-08-21 NOTE — Telephone Encounter (Signed)
appt made and avs printed °

## 2015-08-21 NOTE — Progress Notes (Signed)
[place]  Telephone:([phone]) [phone] Fax:([phone]) [phone]     ID: Gloria Lang DOB: 12-Jul-1960  MR#: [id number]  AQT#:[id number]  Patient Care Team: Everardo Beals, NP as PCP - General Chauncey Cruel, MD as Consulting Physician (Oncology) Kyung Rudd, MD as Consulting Physician (Radiation Oncology) Stark Klein, MD as Consulting Physician (General Surgery) PCP: Gloria Pillow, NP GYN: OTHER MD:  CHIEF COMPLAINT: Triple negative breast cancer  CURRENT TREATMENT: Neoadjuvant chemotherapy  BREAST CANCER HISTORY: Gloria Lang started experiencing pain in her left breast about a month prior to bringing it to medical attention. The pain would, go but overall it tended to be getting worse. There was no obvious mass or discharge. She finally brought it to [employer] attention in Dr. Ellie Lunch office and he was able to palpate a mass. He set the patient up for bilateral diagnostic mammography with tomography and left breast ultrasonography at the [facility] 07/30/2015. The breast density was category D. In the upper-outer quadrant of the left breast there was a spiculated mass which was palpable at the 1:30 o'clock location 10 cm from the nipple. There was no palpable axillary adenopathy. Ultrasound confirmed an irregular hypoechoic mass in the area just mentioned measuring 2.4 cm. The left axilla showed 2 adjacent lymph nodes with cortical thickening.  On 08/05/2015 Gloria Lang underwent biopsy of the left breast mass and also of one of the left axillary lymph nodes in question. The final pathology ([facility]AA [id number]) showed both biopsies to be involved by invasive mammary carcinoma, which proved to be E-cadherin positive and therefore consistent with a ductal phenotype. Prognostic panels were run on both biopsies. Both were estrogen and progesterone receptor negative. The MIB-1 of the breast mass was 10% but that of the lymph node mass was 60%. HER-2 was negative and both cases, with a  signals ratio is between 1.63 and 1.71, and number per cell between 1.95 and 2.05.  Her subsequent history is as detailed below  INTERVAL HISTORY: Gloria Lang returns for follow up of her breast cancer, accompanied by her significant other Gloria Lang. She had a port placed yesterday and is here to discuss the antiemetic schedule for neoadjuvant chemotherapy.  REVIEW OF SYSTEMS: The patient denies unusual headaches, visual changes, nausea, vomiting, stiff neck, dizziness, or gait imbalance. There has been no cough, phlegm production, or pleurisy, no chest pain or pressure, and no change in bowel or bladder habits. The patient denies fever, rash, bleeding, unexplained fatigue or unexplained weight loss. The patient does admit to hot flashes and night sweats, with some insomnia. She has seasonal allergies. She has some joint pain and back pain which is not more persistent or intense than prior. She has rare headaches. A detailed review of systems was otherwise entirely negative.  PAST MEDICAL HISTORY: Past Medical History  Diagnosis Date  . Breast cancer of upper-outer quadrant of left female breast ([facility]) 08/07/2015  . Anemia   . Hot flashes   . Family history of adverse reaction to anesthesia     younger sister has same issues with anesthesia  . Complication of anesthesia     patient states she has coded multiple times before with anesthesia. her sister has similar issues.   . Shortness of breath dyspnea     if patient's head is flat (per pt)  . Asthma     only in the summer time- uses albuterol inhaler  . GERD (gastroesophageal reflux disease)   . History of hiatal hernia     had hiatal  hernia repair in '94 (per patient)  . Arthritis   . Hx of peptic ulcer   . Hx of transfusion of packed red blood cells   . Sleep apnea     couldn't tolerate CPAP d/t claustrophobia; sleep upright "90 degree angle": study done at [facility] several years ago    PAST SURGICAL HISTORY: Past Surgical  History  Procedure Laterality Date  . Abdominal hysterectomy    . Tubal ligation    . Hernia repair    . Colonoscopy    . Upper gi endoscopy      FAMILY HISTORY Family History  Problem Relation Age of Onset  . Colon cancer Brother 7  . Throat cancer Maternal Grandfather   . Lung cancer Maternal Uncle   . Ovarian cancer Sister 60    suspected but not pathologically confirmed  . Lung cancer Paternal Uncle   . Breast cancer Maternal Aunt 2  The patient's father died at age 66, from causes not related to cancer. The patient's mother is age 33 as of April 2017. The patient has 2 brothers, 3 sisters. The patient has 1 aunt diagnosed with breast cancer in her 63s, a paternal grandfather and an uncle with throat cancer, a brother with colon cancer diagnosed in his 37s, and an uncle on the paternal side with lung cancer.   GYNECOLOGIC HISTORY:  No LMP recorded. Patient has had a hysterectomy.  Menarche age 44, first live birth age 89. She is GX P2. The patient underwent total abdominal hysterectomy with bilateral salpingo-oophorectomy for fibroids. However she tells me evaluation 2 years after that surgery showed that she still had a uterus and ovaries. She never called the surgeon ( in [place]) back to discuss this because she says he has retired.   SOCIAL HISTORY:  Ailey works as a Quarry manager. At home she lives with her sister Gloria Lang and 2 grandchildren, aged 60 and 38. The patient's children are  [employer], lives in [place] and works as a Librarian, academic in a plant,  and e [place], lives in [place] and is disabled. The patient has a total of 13 grandchildren and one great-grandchild. She attends a local [employer]:  not in place    HEALTH MAINTENANCE: Social History  Substance Use Topics  . Smoking status: Never Smoker   . Smokeless tobacco: Not on file  . Alcohol Use: No     Colonoscopy: 2006?  PAP: 2014  Bone density:  Lipid  panel:  No Known Allergies  Current Outpatient Prescriptions  Medication Sig Dispense Refill  . Multiple Vitamin (MULTIVITAMIN WITH MINERALS) TABS tablet Take 1 tablet by mouth daily.    . Aspirin Buf,CaCarb-MgCarb-MgO, (BUFFERED ASPIRIN) 325 MG TABS Take 650 tablets by mouth 2 (two) times daily as needed. Reported on 08/21/2015    . dexamethasone (DECADRON) 4 MG tablet Take 2 tablets by mouth once a day on the day after chemotherapy and then take 2 tablets two times a day for 2 days. Take with food. 30 tablet 1  . lidocaine-prilocaine (EMLA) cream Apply to affected area once 30 g 3  . loratadine (CLARITIN) 10 MG tablet Take 1 tablet (10 mg total) by mouth daily. 30 tablet 1  . ondansetron (ZOFRAN) 8 MG tablet Take 1 tablet (8 mg total) by mouth 2 (two) times daily as needed. Start on the third day after chemotherapy. 30 tablet 1  . oxyCODONE-acetaminophen (ROXICET) 5-325 MG tablet Take 1-2 tablets by  mouth every 4 (four) hours as needed for severe pain. (Patient not taking: Reported on 08/21/2015) 30 tablet 0  . prochlorperazine (COMPAZINE) 10 MG tablet Take 1 tablet (10 mg total) by mouth every 6 (six) hours as needed (Nausea or vomiting). 30 tablet 1   No current facility-administered medications for this visit.    OBJECTIVE: Middle-aged African-American woman who appears well  There were no vitals filed for this visit.   There is no weight on file to calculate BMI.    ECOG FS:0 - Asymptomatic   Skin: warm, dry  HEENT: sclerae anicteric, conjunctivae pink, oropharynx clear. No thrush or mucositis.  Lymph Nodes: No cervical or supraclavicular lymphadenopathy  Lungs: clear to auscultation bilaterally, no rales, wheezes, or rhonci  Heart: regular rate and rhythm  Abdomen: round, soft, non tender, positive bowel sounds  Musculoskeletal: No focal spinal tenderness, no peripheral edema  Neuro: non focal, well oriented, positive affect  Breasts: deferred. Newly placed left upper chest  portacath. Clean dry and intact.  LAB RESULTS:  CMP     Component Value Date/Time   NA 141 08/21/2015 1423   NA 142 08/19/2015 1417   K 3.8 08/21/2015 1423   K 4.9 08/19/2015 1417   CL 106 08/19/2015 1417   CO2 28 08/21/2015 1423   CO2 26 08/19/2015 1417   GLUCOSE 96 08/21/2015 1423   GLUCOSE 82 08/19/2015 1417   BUN 9.9 08/21/2015 1423   BUN 13 08/19/2015 1417   CREATININE 0.9 08/21/2015 1423   CREATININE 0.90 08/19/2015 1417   CALCIUM 9.5 08/21/2015 1423   CALCIUM 10.1 08/19/2015 1417   PROT 6.7 08/21/2015 1423   ALBUMIN 3.8 08/21/2015 1423   AST 20 08/21/2015 1423   ALT 11 08/21/2015 1423   ALKPHOS 47 08/21/2015 1423   BILITOT 0.31 08/21/2015 1423   GFRNONAA >60 08/19/2015 1417   GFRAA >60 08/19/2015 1417    INo results found for: SPEP, UPEP  Lab Results  Component Value Date   WBC 6.8 08/21/2015   NEUTROABS 3.7 08/21/2015   HGB 12.9 08/21/2015   HCT 39.4 08/21/2015   MCV 93.1 08/21/2015   PLT 262 08/21/2015      Chemistry      Component Value Date/Time   NA 141 08/21/2015 1423   NA 142 08/19/2015 1417   K 3.8 08/21/2015 1423   K 4.9 08/19/2015 1417   CL 106 08/19/2015 1417   CO2 28 08/21/2015 1423   CO2 26 08/19/2015 1417   BUN 9.9 08/21/2015 1423   BUN 13 08/19/2015 1417   CREATININE 0.9 08/21/2015 1423   CREATININE 0.90 08/19/2015 1417      Component Value Date/Time   CALCIUM 9.5 08/21/2015 1423   CALCIUM 10.1 08/19/2015 1417   ALKPHOS 47 08/21/2015 1423   AST 20 08/21/2015 1423   ALT 11 08/21/2015 1423   BILITOT 0.31 08/21/2015 1423       No results found for: LABCA2  No components found for: LABCA125  No results for input(s): INR in the last 168 hours.  Urinalysis No results found for: COLORURINE, APPEARANCEUR, LABSPEC, PHURINE, GLUCOSEU, HGBUR, BILIRUBINUR, KETONESUR, PROTEINUR, UROBILINOGEN, NITRITE, LEUKOCYTESUR    ELIGIBLE FOR AVAILABLE RESEARCH PROTOCOL: B 51, [facility]  STUDIES: Dg Chest 1 View  08/20/2015  CLINICAL  DATA:  Followup pneumothorax. EXAM: FLOURO GUIDE CV LINE - NO REPORT; CHEST  1 VIEW COMPARISON:  08/20/2015 FINDINGS: The power port tip is in the distal SVC. The cardiac silhouette, mediastinal and hilar contours are normal.  The lungs are clear. No pneumothorax identified. IMPRESSION: No acute cardiopulmonary findings.  No pneumothorax. Electronically Signed   By: Marijo Sanes M.D.   On: 08/20/2015 17:09   Mr Breast Bilateral W Wo Contrast  08/19/2015  CLINICAL DATA:  Biopsy proven grade 2 invasive mammary carcinoma in the 1:30 o'clock location of the left breast. Metastatic mammary carcinoma biopsied from 1 axillary lymph node. LABS:  None obtained at the time of imaging. EXAM: BILATERAL BREAST MRI WITH AND WITHOUT CONTRAST TECHNIQUE: Multiplanar, multisequence MR images of both breasts were obtained prior to and following the intravenous administration of 13 ml of MultiHance. THREE-DIMENSIONAL MR IMAGE RENDERING ON INDEPENDENT WORKSTATION: Three-dimensional MR images were rendered by post-processing of the original MR data on an independent workstation. The three-dimensional MR images were interpreted, and findings are reported in the following complete MRI report for this study. Three dimensional images were evaluated at the independent DynaCad workstation COMPARISON:  Previous exam(s). FINDINGS: Breast composition: c. Heterogeneous fibroglandular tissue. Background parenchymal enhancement: Moderate. Right breast: No mass or abnormal enhancement. Left breast: There is a 7.6 x 4.2 x 2.3 (anterior posterior, transverse and craniocaudal dimensions) irregular enhancing mass in the middle and posterior thirds of the lateral aspect of the left breast corresponding with the biopsy proven invasive mammary carcinoma. Signal void artifact from the biopsy clip is seen in the posterior aspect of the enhancing mass. Lymph nodes: Several enlarged level 1 left axillary lymph nodes are visualized. One of them has biopsy  changes and a signal void artifact from the biopsy clip. The largest lymph node measures 1.6 cm. Ancillary findings:  None. IMPRESSION: 7.6 cm enhancing irregular mass in the lateral aspect of the left breast corresponding with the biopsy proven invasive mammary carcinoma. Enlarged left axillary adenopathy corresponding with the known metastatic disease. RECOMMENDATION: Treatment planning of the known left breast invasive mammary carcinoma and metastatic axillary adenopathy is recommended. The surgical clip is located along the posterior aspect of the carcinoma. If pathologically proving the extent of disease is clinically indicated the anterior aspect of the enhancing mass could be biopsied with MR guidance. BI-RADS CATEGORY  6: Known biopsy-proven malignancy. Electronically Signed   By: Lillia Mountain M.D.   On: 08/19/2015 11:34   Dg Chest Port 1 View  08/20/2015  CLINICAL DATA:  Left Port-A-Cath insertion EXAM: PORTABLE CHEST 1 VIEW COMPARISON:  None. FINDINGS: Left subclavian Port-A-Cath terminates at the cavoatrial junction. Normal heart size. Normal mediastinal contour. No right pneumothorax. Questionable asymmetric lucency at the left lung apex, cannot exclude a tiny left apical pneumothorax. No pleural effusion. No pulmonary edema. Mild patchy opacity at the lateral left lung base. IMPRESSION: 1. Questionable asymmetric lucency at the left lung apex, cannot exclude a tiny left apical pneumothorax. Short-term follow-up chest radiograph advised. 2. Well-positioned left subclavian Port-A-Cath. 3. Mild patchy left lung base opacity, favor atelectasis. These results were called by telephone at the time of interpretation on 08/20/2015 at 1:18 pm to RN [facility], who verbally acknowledged these results. Electronically Signed   By: Ilona Sorrel M.D.   On: 08/20/2015 13:19   Mm Digital Diagnostic Unilat L  08/05/2015  CLINICAL DATA:  55 year old female status post ultrasound-guided biopsies of a left breast mass  at 130 and of an enlarged left axillary lymph node. EXAM: DIAGNOSTIC LEFT MAMMOGRAM POST ULTRASOUND BIOPSY COMPARISON:  Previous exam(s). FINDINGS: Mammographic images were obtained following ultrasound guided biopsies of a left breast mass at 130 and of an enlarged left axillary lymph node.  Post biopsy mammogram demonstrates the ribbon shaped biopsy marker to be within the left breast mass at 130 and the spiral shaped biopsy marker to be in the expected position of an enlarged left axillary lymph node. IMPRESSION: Appropriate marker position as above. Final Assessment: Post Procedure Mammograms for Marker Placement Electronically Signed   By: Pamelia Hoit M.D.   On: 08/05/2015 16:31   Dg Fluoro Guide Cv Line-no Report  08/20/2015  CLINICAL DATA:  FLOURO GUIDE CV LINE Fluoroscopy was utilized by the requesting physician.  No radiographic interpretation.   US Breast Ltd Uni Left Inc Axilla  07/30/2015  CLINICAL DATA:  Palpable abnormality in the left breast. Patient originally noted focal tenderness in the upper-outer quadrant. Palpable mass was noted at the time of her recent physical exam. EXAM: 2D DIGITAL DIAGNOSTIC BILATERAL MAMMOGRAM WITH CAD AND ADJUNCT TOMO ULTRASOUND LEFT BREAST COMPARISON:  01/28/2010 ACR Breast Density Category d: The breast tissue is extremely dense, which lowers the sensitivity of mammography. FINDINGS: Spiculated mass is identified in the upper-outer quadrant of the left breast. Right breast is negative. Mammographic images were processed with CAD. On physical exam, I palpate a discrete mass in the 130 o'clock location of the left breast 10 cm from the nipple. I palpate no axillary adenopathy. Targeted ultrasound is performed, showing an irregular hypoechoic mass with acoustic shadowing the 130 o'clock location of the left breast 10 cm from the nipple measuring 2.4 x 2.1 x 1.8 cm. Internal vascularity identified by Doppler evaluation. Evaluation of the left axilla shows 2 adjacent  lymph nodes with cortical thickening. IMPRESSION: 1. Suspicious mass in the 130 o'clock location of the left breast for which tissue diagnosis is recommended. 2. Suspicious lower left axillary lymph nodes. Tissue diagnosis of 1 of these nodes is recommended. RECOMMENDATION: Ultrasound-guided core biopsy is recommended and scheduled for the patienton 08/05/2015. I have discussed the findings and recommendations with the patient. Results were also provided in writing at the conclusion of the visit. If applicable, a reminder letter will be sent to the patient regarding the next appointment. BI-RADS CATEGORY  5: Highly suggestive of malignancy. Electronically Signed   By: Nolon Nations M.D.   On: 07/30/2015 17:04   Mm Diag Breast Tomo Bilateral  07/30/2015  CLINICAL DATA:  Palpable abnormality in the left breast. Patient originally noted focal tenderness in the upper-outer quadrant. Palpable mass was noted at the time of her recent physical exam. EXAM: 2D DIGITAL DIAGNOSTIC BILATERAL MAMMOGRAM WITH CAD AND ADJUNCT TOMO ULTRASOUND LEFT BREAST COMPARISON:  01/28/2010 ACR Breast Density Category d: The breast tissue is extremely dense, which lowers the sensitivity of mammography. FINDINGS: Spiculated mass is identified in the upper-outer quadrant of the left breast. Right breast is negative. Mammographic images were processed with CAD. On physical exam, I palpate a discrete mass in the 130 o'clock location of the left breast 10 cm from the nipple. I palpate no axillary adenopathy. Targeted ultrasound is performed, showing an irregular hypoechoic mass with acoustic shadowing the 130 o'clock location of the left breast 10 cm from the nipple measuring 2.4 x 2.1 x 1.8 cm. Internal vascularity identified by Doppler evaluation. Evaluation of the left axilla shows 2 adjacent lymph nodes with cortical thickening. IMPRESSION: 1. Suspicious mass in the 130 o'clock location of the left breast for which tissue diagnosis is  recommended. 2. Suspicious lower left axillary lymph nodes. Tissue diagnosis of 1 of these nodes is recommended. RECOMMENDATION: Ultrasound-guided core biopsy is recommended and scheduled for the patienton  08/05/2015. I have discussed the findings and recommendations with the patient. Results were also provided in writing at the conclusion of the visit. If applicable, a reminder letter will be sent to the patient regarding the next appointment. BI-RADS CATEGORY  5: Highly suggestive of malignancy. Electronically Signed   By: Nolon Nations M.D.   On: 07/30/2015 17:04   [name] Lt Breast Bx W Loc Dev 1st Lesion Img Bx Spec US Guide  08/06/2015  ADDENDUM REPORT: 08/06/2015 14:38 ADDENDUM: Pathology revealed GRADE II INVASIVE MAMMARY CARCINOMA of the Left breast at the 1:30 o'clock location. ONE LYMPH NODE POSITIVE FOR METASTATIC MAMMARY CARCINOMA of the Left axilla. This was found to be concordant by Dr. Pamelia Hoit. Pathology results were discussed with the patient by telephone. The patient reported doing well after the biopsy with tenderness at the site. Post biopsy instructions and care were reviewed and questions were answered. The patient was encouraged to call [facility] for any additional concerns. The patient was referred to [facility] at [facility] on August 11, 2015. Pathology results reported by Terie Purser, RN on 08/06/2015. Electronically Signed   By: Pamelia Hoit M.D.   On: 08/06/2015 14:38  08/06/2015  CLINICAL DATA:  55 year old female for ultrasound-guided biopsy of an enlarged left axillary lymph node EXAM: ULTRASOUND GUIDED CORE NEEDLE BIOPSY OF A LEFT AXILLARY NODE COMPARISON:  Previous exam(s). FINDINGS: I met with the patient and we discussed the procedure of ultrasound-guided biopsy, including benefits and alternatives. We discussed the high likelihood of a successful procedure. We discussed the risks of the  procedure, including infection, bleeding, tissue injury, clip migration, and inadequate sampling. Informed written consent was given. The usual time-out protocol was performed immediately prior to the procedure. Using sterile technique and 1% Lidocaine as local anesthetic, under direct ultrasound visualization, a 14 gauge spring-loaded device was used to perform biopsy of an enlarged left axillary lymph node using a lateral to medial approach. At the conclusion of the procedure a spiral shaped tissue marker clip was deployed into the biopsy cavity. Follow up 2 view mammogram was performed and dictated separately. IMPRESSION: Ultrasound guided biopsy of an enlarged left axillary lymph node. No apparent complications. Electronically Signed: By: Pamelia Hoit M.D. On: 08/05/2015 16:30   [name] Lt Breast Bx W Loc Dev Ea Add Lesion Img Bx Spec US Guide  08/06/2015  ADDENDUM REPORT: 08/06/2015 14:38 ADDENDUM: Pathology revealed GRADE II INVASIVE MAMMARY CARCINOMA of the Left breast at the 1:30 o'clock location. ONE LYMPH NODE POSITIVE FOR METASTATIC MAMMARY CARCINOMA of the Left axilla. This was found to be concordant by Dr. Pamelia Hoit. Pathology results were discussed with the patient by telephone. The patient reported doing well after the biopsy with tenderness at the site. Post biopsy instructions and care were reviewed and questions were answered. The patient was encouraged to call [facility] for any additional concerns. The patient was referred to [facility] on August 11, 2015. Pathology results reported by Terie Purser, RN on 08/06/2015. Electronically Signed   By: Pamelia Hoit M.D.   On: 08/06/2015 14:38  08/06/2015  CLINICAL DATA:  55 year old female for ultrasound-guided biopsy of a left breast mass at 130 EXAM: ULTRASOUND GUIDED LEFT BREAST CORE NEEDLE BIOPSY COMPARISON:  Previous exam(s). FINDINGS: I met with the  patient and we discussed the procedure of ultrasound-guided biopsy, including benefits  and alternatives. We discussed the high likelihood of a successful procedure. We discussed the risks of the procedure, including infection, bleeding, tissue injury, clip migration, and inadequate sampling. Informed written consent was given. The usual time-out protocol was performed immediately prior to the procedure. Using sterile technique and 1% Lidocaine as local anesthetic, under direct ultrasound visualization, a 12 gauge spring-loaded device was used to perform biopsy of a left breast mass at 130 using a lateral to medial approach. At the conclusion of the procedure a ribbon shaped tissue marker clip was deployed into the biopsy cavity. Follow up 2 view mammogram was performed and dictated separately. IMPRESSION: Ultrasound guided biopsy of a left breast mass at 130. No apparent complications. Electronically Signed: By: Pamelia Hoit M.D. On: 08/05/2015 16:30    ASSESSMENT: 55 y.o. [place] woman status post left breast upper outer quadrant and left axillary lymph node biopsy 08/05/2015, both positive for an invasive ductal carcinoma, grade 2 or 3, triple negative, with an MIB-1 between 10 and 60%   (1) neoadjuvant chemotherapy will consist of doxorubicin and cyclophosphamide in dose dense fashion 4 followed by weekly paclitaxel and carboplatin 12  (2) definitive surgery we will follow chemotherapy  (3) adjuvant radiation to follow as appropriate  (4) genetics testing pending  PLAN: Yoseline and I spent about 40 minutes discussing her upcoming chemotherapy plans. She attended chemotherapy school earlier today, so a lot of this information was review for her. She was made aware of potential side effects and toxicities of both doxorubicin and cyclophosphamide. She was made aware of her neulasta injections, including the fact that these will be administered with an onbody injector that she will need to wear until  the device is no longer active.   Finally, we reviewed her antiemetic schedule, and she feels comfortable with every medication listed as well as the indication and potential side effects. These medicines were sent to her pharmacy today. She declined a prescription for ativan to use at night for anxiety/sleep.  Jozelynn will return on Thursday for her first cycle of treatment. She understands and agrees with this plan. She knows the goal of treatment in her case is cure. She has been encouraged to call with any issues that might arise before her next visit here.  Laurie Panda, NP   08/21/2015 4:21 PM

## 2015-08-24 ENCOUNTER — Ambulatory Visit (HOSPITAL_BASED_OUTPATIENT_CLINIC_OR_DEPARTMENT_OTHER)
Admission: RE | Admit: 2015-08-24 | Discharge: 2015-08-24 | Disposition: A | Discharge status: Home / Self Care | Payer: BLUE CROSS/BLUE SHIELD | Source: Ambulatory Visit | Attending: Internal Medicine | Admitting: Internal Medicine

## 2015-08-24 ENCOUNTER — Ambulatory Visit (HOSPITAL_BASED_OUTPATIENT_CLINIC_OR_DEPARTMENT_OTHER)
Admission: RE | Admit: 2015-08-24 | Discharge: 2015-08-24 | Disposition: A | Discharge status: Home / Self Care | Payer: BLUE CROSS/BLUE SHIELD | Source: Ambulatory Visit | Attending: General Surgery | Admitting: General Surgery

## 2015-08-24 VITALS — BP 110/80 | HR 77 | Resp 18 | Wt 146.5 lb

## 2015-08-24 DIAGNOSIS — C50412 Malignant neoplasm of upper-outer quadrant of left female breast: Secondary | ICD-10-CM

## 2015-08-24 NOTE — Progress Notes (Signed)
  Echocardiogram 2D Echocardiogram has been performed.  Gloria Lang 08/24/2015, 3:08 PM

## 2015-08-24 NOTE — Progress Notes (Signed)
Patient ID: Gloria Lang, female   DOB: 19-Nov-1960, 55 y.o.   MRN: [id number]    Advanced Heart Failure Cardio-Oncology Clinic Note   Referring Physician: Dr Lurline Del Primary Care: Everardo Beals, NP Consulting physician: Dr Stark Klein Consulting Radation Oncologist: Dr Kyung Rudd Primary Oncologist: Dr Jana Hakim Primary Cardiologist: New (Dr. Haroldine Laws)  HPI:  Gloria Lang is a 55 y.o. female with recently diagnosed breast cancer of the left breast.  She had bilateral diagnostic mammography with tomography and left breast ultrasonography at the [facility] 07/30/2015, approx 1 month after she had starting having breast pains. The breast density was category D. In the upper-outer quadrant of the left breast there was a spiculated mass which was palpable at the 1:30 o'clock location 10 cm from the nipple. There was no palpable axillary adenopathy. Ultrasound confirmed an irregular hypoechoic mass in the area just mentioned measuring 2.4 cm. The left axilla showed 2 adjacent lymph nodes with cortical thickening.  On 08/05/2015 Gloria Lang underwent biopsy of the left breast mass and also of one of the left axillary lymph nodes in question. The final pathology ([id number]) showed both biopsies to be involved by invasive mammary carcinoma, which proved to be E-cadherin positive and therefore consistent with a ductal phenotype. Prognostic panels were run on both biopsies. Both were estrogen and progesterone receptor negative. The MIB-1 of the breast mass was 10% but that of the lymph node mass was 60%. HER-2 was negative and both cases, with a signals ratio is between 1.63 and 1.71, and number per cell between 1.95 and 2.05.  Port-a-cath placed 08/20/15.  Will start neoadjuvant chemotherapy 08/27/15 with doxorubicin and cyclophosphamide in dose dense fashion 4 followed by weekly paclitaxel and carboplatin 12 . Genetic testings pending as of 08/24/15.   She presents today to establish in  cardio-oncology clinic. No symptoms thus far.  Has already been educated some on cardio-toxicity. Wants to know her lifting restrictions.   Echo today (08/24/15) LVEF 55-60%, Mild TR,  Lat S' 9.0, GLS -18.5  Review of Systems: [y] = yes, _0  = no   General: Weight gain _1 ; Weight loss _2 ; Anorexia _3 ; Fatigue _4 ; Fever _5 ; Chills _6 ; Weakness _7   Cardiac: Chest pain/pressure _8 ; Resting SOB _9 ; Exertional SOB _10 ; Orthopnea _11 ; Pedal Edema _12 ; Palpitations _13 ; Syncope _14 ; Presyncope _15 ; Paroxysmal nocturnal dyspnea_16   Pulmonary: Cough _17 ; Wheezing_18 ; Hemoptysis_19 ; Sputum _20 ; Snoring _21   GI: Vomiting_22 ; Dysphagia_23 ; Melena_24 ; Hematochezia _25 ; Heartburn_26 ; Abdominal pain _27 ; Constipation _28 ; Diarrhea _29 ; BRBPR _30   GU: Hematuria_31 ; Dysuria _32 ; Nocturia_33   Vascular: Pain in legs with walking _34 ; Pain in feet with lying flat _35 ; Non-healing sores _36 ; Stroke _37 ; TIA _38 ; Slurred speech _39 ;  Neuro: Headaches_40 ; Vertigo_41 ; Seizures_42 ; Paresthesias_43 ;Blurred vision _44 ; Diplopia _45 ; Vision changes _46   Ortho/Skin: Arthritis _47 ; Joint pain _48 ; Muscle pain _49 ; Joint swelling _50 ; Back Pain _51 ; Rash _52   Psych: Depression_53 ; Anxiety_54   Heme: Bleeding problems _55 ; Clotting disorders _56 ; Anemia _57   Endocrine: Diabetes _58 ; Thyroid dysfunction_59    Past Medical History  Diagnosis Date  . Breast cancer of upper-outer quadrant of left female breast ([place]) 08/07/2015  . Anemia   . Hot flashes   .  Family history of adverse reaction to anesthesia     younger sister has same issues with anesthesia  . Complication of anesthesia     patient states she has coded multiple times before with anesthesia. her sister has similar issues.   . Shortness of breath dyspnea     if patient's head is flat (per pt)  . Asthma     only in the summer time- uses albuterol inhaler  . GERD (gastroesophageal reflux disease)   . History of hiatal hernia     had hiatal hernia repair  in '94 (per patient)  . Arthritis   . Hx of peptic ulcer   . Hx of transfusion of packed red blood cells   . Sleep apnea     couldn't tolerate CPAP d/t claustrophobia; sleep upright "90 degree angle": study done at [facility] several years ago    Current Outpatient Prescriptions  Medication Sig Dispense Refill  . dexamethasone (DECADRON) 4 MG tablet Take 2 tablets by mouth once a day on the day after chemotherapy and then take 2 tablets two times a day for 2 days. Take with food. 30 tablet 1  . diphenhydrAMINE (BENADRYL) 25 mg capsule Take 25 mg by mouth at bedtime.    . lidocaine-prilocaine (EMLA) cream Apply to affected area once 30 g 3  . loratadine (CLARITIN) 10 MG tablet Take 1 tablet (10 mg total) by mouth daily. 30 tablet 1  . Multiple Vitamin (MULTIVITAMIN WITH MINERALS) TABS tablet Take 1 tablet by mouth daily.    . ondansetron (ZOFRAN) 8 MG tablet Take 1 tablet (8 mg total) by mouth 2 (two) times daily as needed. Start on the third day after chemotherapy. 30 tablet 1  . oxyCODONE-acetaminophen (ROXICET) 5-325 MG tablet Take 1-2 tablets by mouth every 4 (four) hours as needed for severe pain. 30 tablet 0  . prochlorperazine (COMPAZINE) 10 MG tablet Take 1 tablet (10 mg total) by mouth every 6 (six) hours as needed (Nausea or vomiting). 30 tablet 1   No current facility-administered medications for this encounter.    No Known Allergies    Social History   Social History  . Marital Status: Single    Spouse Name: N/A  . Number of Children: N/A  . Years of Education: N/A   Occupational History  . Not on file.   Social History Main Topics  . Smoking status: Never Smoker   . Smokeless tobacco: Not on file  . Alcohol Use: No  . Drug Use: No  . Sexual Activity: Not on file   Other Topics Concern  . Not on file   Social History Narrative      Family History  Problem Relation Age of Onset  . Colon cancer Brother 24  . Throat cancer Maternal Grandfather   .  Lung cancer Maternal Uncle   . Ovarian cancer Sister 66    suspected but not pathologically confirmed  . Lung cancer Paternal Uncle   . Breast cancer Maternal Aunt 60    Filed Vitals:   08/24/15 1537  BP: 110/80  Lang: 77  Resp: 18  Weight: 146 lb 8 oz (66.452 kg)  SpO2: 100%   Wt Readings from Last 3 Encounters:  08/24/15 146 lb 8 oz (66.452 kg)  08/20/15 147 lb (66.679 kg)  08/19/15 147 lb 6.4 oz (66.86 kg)     PHYSICAL EXAM: General:  Well appearing. No respiratory difficulty HEENT: normal Neck: supple. no JVD. Carotids 2+ bilat; no bruits. No lymphadenopathy  or thyromegaly appreciated. Cor: PMI nondisplaced. Regular rate & rhythm. No rubs, gallops or murmurs. Port-o-cath left chest Lungs: clear Abdomen: soft, nontender, nondistended. No hepatosplenomegaly. No bruits or masses. Good bowel sounds. Extremities: no cyanosis, clubbing, rash, edema Neuro: alert & oriented x 3, cranial nerves grossly intact. moves all 4 extremities w/o difficulty. Affect pleasant.   ASSESSMENT & PLAN:  1. Breast Cancer, Left with lymph node involvement - She is to undergo neoadjuvant chemotherapy will consist of doxorubicin and cyclophosphamide in dose dense fashion 4 followed by weekly paclitaxel and carboplatin 12 - Definitive surgery to follow chemotherapy.  - Discussed risk of cardiotoxicity with doxorubicin. Will follow up in 2 months with Echo. (due to dose dense chemotherapy of 4 weeks)  Beryle Beams" [place], PA-C 08/24/2015    Patient seen and examined with Oda Kilts, PA-C. We discussed all aspects of the encounter. I agree with the assessment and plan as stated above.   Explained incidence of doxorubicin cardiotoxicity and role of Cardio-oncology clinic at length. Echo images reviewed personally. All parameters stable. Reviewed signs and symptoms of HF to look for. We will see back with echo in 3 months.  Bensimhon, Daniel,MD 11:27 PM

## 2015-08-24 NOTE — Progress Notes (Signed)
Advanced Heart Failure Medication Review by a Pharmacist  Does the patient  feel that his/her medications are working for him/her?  yes  Has the patient been experiencing any side effects to the medications prescribed?  no  Does the patient measure his/her own blood pressure or blood glucose at home?  no   Does the patient have any problems obtaining medications due to transportation or finances?   no  Understanding of regimen: good Understanding of indications: good Potential of compliance: good Patient understands to avoid NSAIDs. Patient understands to avoid decongestants.  Issues to address at subsequent visits: None   Pharmacist comments:  Gloria Lang is a pleasant 55 yo F presenting without a medication list but with good recall of her regimen. She reports good compliance and did not have any specific medication-related questions or concerns for me at this time.   Gloria Lang, PharmD, BCPS, CPP Clinical Pharmacist Pager: [phone] Phone: [phone] 08/24/2015 3:47 PM      Time with patient: 6 minutes Preparation and documentation time: 2 minutes Total time: 8 minutes

## 2015-08-25 ENCOUNTER — Other Ambulatory Visit: Payer: Self-pay | Admitting: Radiation Oncology

## 2015-08-25 ENCOUNTER — Encounter (HOSPITAL_COMMUNITY): Payer: BLUE CROSS/BLUE SHIELD

## 2015-08-25 ENCOUNTER — Encounter (HOSPITAL_COMMUNITY): Payer: Self-pay | Admitting: General Surgery

## 2015-08-25 ENCOUNTER — Ambulatory Visit (HOSPITAL_COMMUNITY): Payer: BLUE CROSS/BLUE SHIELD

## 2015-08-25 ENCOUNTER — Encounter: Payer: Self-pay | Admitting: Oncology

## 2015-08-25 ENCOUNTER — Other Ambulatory Visit (HOSPITAL_COMMUNITY): Payer: BLUE CROSS/BLUE SHIELD

## 2015-08-25 ENCOUNTER — Other Ambulatory Visit: Payer: Self-pay | Admitting: General Surgery

## 2015-08-25 NOTE — Progress Notes (Signed)
Enrolled pt in the [employer] program.  Faxed signed app & activated card on 08/24/15.

## 2015-08-25 NOTE — Consult Note (Signed)
Expand All Collapse All    Radiation Oncology [phone] ________________________________  Name: Gloria ParkerMRN: [id number] Date: 4/5/2017DOB: March 12, 1961  WU:[id number], Luane School, NP Stark Klein, MD   REFERRING PHYSICIAN: Stark Klein, MD   DIAGNOSIS: There were no encounter diagnoses.   HISTORY OF PRESENT ILLNESS: Gloria Lang is a 55 y.o. female seen in the [facility] breast clinic for a new diagnosis of breast cancer. She had a palpable mass in the left breast noted for about 2 weeks. A mammogram found a visible mass, and an ultrasound on 07/30/15 revealed a 2.4 x 2.1 x 1.8 cm mass. She had two lymph nodes htat were notible for cortical thickening. A biopsy on 08/05/15 revealed an invasive mammary cancer, with one lymph node also being involved. She comes today for further discussion of care, and meets with Dr. Lisbeth Renshaw regarding the role of radiotherapy.    PREVIOUS RADIATION THERAPY: No   PAST MEDICAL HISTORY:  Past Medical History  Diagnosis Date  . Breast cancer of upper-outer quadrant of left female breast ([place]) 08/07/2015  . Anemia   . Hot flashes   . Family history of adverse reaction to anesthesia     younger sister has same issues with anesthesia  . Complication of anesthesia     patient states she has coded multiple times before with anesthesia. her sister has similar issues.   . Shortness of breath dyspnea     if patient's head is flat (per pt)  . Asthma     only in the summer time- uses albuterol inhaler  . GERD (gastroesophageal reflux disease)   . History of hiatal hernia     had hiatal hernia repair in '94 (per patient)  . Arthritis   . Hx of peptic ulcer   . Hx of transfusion of packed red blood cells   . Sleep apnea     couldn't tolerate CPAP d/t claustrophobia; sleep upright "90 degree angle": study done at [facility] several years ago        PAST SURGICAL HISTORY: Past Surgical History    Procedure Laterality Date  . Abdominal hysterectomy    . Tubal ligation    . Hernia repair       FAMILY HISTORY:  Family History  Problem Relation Age of Onset  . Colon cancer Brother 63  . Throat cancer Maternal Grandfather   . Lung cancer Maternal Uncle   . Ovarian cancer Sister 86    suspected but not pathologically confirmed  . Lung cancer Paternal Uncle   . Breast cancer Maternal Aunt 60     SOCIAL HISTORY:  reports that she has never smoked. She does not have any smokeless tobacco history on file. She reports that she does not drink alcohol or use illicit drugs. The patient is single and resides in [place]. She is a Psychologist, sport and exercise.   ALLERGIES: Review of patient's allergies indicates no known allergies.   MEDICATIONS:  No current outpatient prescriptions on file.   No current facility-administered medications for this encounter.     REVIEW OF SYSTEMS: On review of systems, the patient reports that she is doing well overall. She2 denies any chest pain, shortness of breath, cough, fevers, chills, night sweats, unintended weight changes. She denies any bowel or bladder disturbances, and denies abdominal pain, nausea or vomiting. She denies any new musculoskeletal or joint aches or pains. A complete review of systems is obtained and is otherwise negative.    PHYSICAL EXAM:   Pain scale 0/10 In general  this is a well appearing African American female in no acute distress. She is alert and oriented x4 and appropriate throughout the examination. HEENT reveals that the patient is normocephalic, atraumatic. EOMs are intact. PERRLA. Skin is intact without any evidence of gross lesions. Cardiovascular exam reveals a regular rate and rhythm, no clicks rubs or murmurs are auscultated. Chest is clear to auscultation bilaterally. Lymphatic assessment is performed and does not reveal any adenopathy in the cervical,  supraclavicular, axillary, or inguinal chains. Bilateral breasts are evaluated. The right does note reveal any visible abnormalities. No palpable masses are noted. No nipple discharge is noted. The left breast reveals fullness along her previous biopsy site on the left lower outer quadrant. No other masses are noted. The right breast is negative for palpable abnormalities. No nipple bleeding or discharge is noted bilaterally. Abdomen has active bowel sounds in all quadrants and is intact. The abdomen is soft, non tender, non distended. Lower extremities are negative for pretibial pitting edema, deep calf tenderness, cyanosis or clubbing.   ECOG = 0  0 - Asymptomatic (Fully active, able to carry on all predisease activities without restriction)  1 - Symptomatic but completely ambulatory (Restricted in physically strenuous activity but ambulatory and able to carry out work of a light or sedentary nature. For example, light housework, office work)  2 - Symptomatic, <50% in bed during the day (Ambulatory and capable of all self care but unable to carry out any work activities. Up and about more than 50% of waking hours)  3 - Symptomatic, >50% in bed, but not bedbound (Capable of only limited self-care, confined to bed or chair 50% or more of waking hours)  4 - Bedbound (Completely disabled. Cannot carry on any self-care. Totally confined to bed or chair)  5 - Death  Eustace Pen MM, Creech RH, Tormey DC, et al. [phone]). "Toxicity and response criteria of the [employer]". [place] Oncol. 5 (6): 649-55    LABORATORY DATA:   Recent Labs    Lab Results  Component Value Date   WBC 4.6 08/12/2015   HGB 13.7 08/12/2015   HCT 41.7 08/12/2015   MCV 93.1 08/12/2015   PLT 308 08/12/2015      Recent Labs    Lab Results  Component Value Date   NA 144 08/12/2015   K 4.1 08/12/2015   CO2 29 08/12/2015      Recent Labs    Lab Results    Component Value Date   ALT 16 08/12/2015   AST 20 08/12/2015   ALKPHOS 48 08/12/2015   BILITOT 0.36 08/12/2015        Imaging Results    RADIOGRAPHY: Mm Digital Diagnostic Unilat L  08/05/2015 CLINICAL DATA: 55 year old female status post ultrasound-guided biopsies of a left breast mass at 130 and of an enlarged left axillary lymph node. EXAM: DIAGNOSTIC LEFT MAMMOGRAM POST ULTRASOUND BIOPSY COMPARISON: Previous exam(s). FINDINGS: Mammographic images were obtained following ultrasound guided biopsies of a left breast mass at 130 and of an enlarged left axillary lymph node. Post biopsy mammogram demonstrates the ribbon shaped biopsy marker to be within the left breast mass at 130 and the spiral shaped biopsy marker to be in the expected position of an enlarged left axillary lymph node. IMPRESSION: Appropriate marker position as above. Final Assessment: Post Procedure Mammograms for Marker Placement Electronically Signed By: Pamelia Hoit M.D. On: 08/05/2015 16:31   US Breast Ltd Uni Left Inc Axilla  07/30/2015 CLINICAL DATA: Palpable abnormality  in the left breast. Patient originally noted focal tenderness in the upper-outer quadrant. Palpable mass was noted at the time of her recent physical exam. EXAM: 2D DIGITAL DIAGNOSTIC BILATERAL MAMMOGRAM WITH CAD AND ADJUNCT TOMO ULTRASOUND LEFT BREAST COMPARISON: 01/28/2010 ACR Breast Density Category d: The breast tissue is extremely dense, which lowers the sensitivity of mammography. FINDINGS: Spiculated mass is identified in the upper-outer quadrant of the left breast. Right breast is negative. Mammographic images were processed with CAD. On physical exam, I palpate a discrete mass in the 130 o'clock location of the left breast 10 cm from the nipple. I palpate no axillary adenopathy. Targeted ultrasound is performed, showing an irregular hypoechoic mass with acoustic shadowing the 130 o'clock location of the left breast 10 cm  from the nipple measuring 2.4 x 2.1 x 1.8 cm. Internal vascularity identified by Doppler evaluation. Evaluation of the left axilla shows 2 adjacent lymph nodes with cortical thickening. IMPRESSION: 1. Suspicious mass in the 130 o'clock location of the left breast for which tissue diagnosis is recommended. 2. Suspicious lower left axillary lymph nodes. Tissue diagnosis of 1 of these nodes is recommended. RECOMMENDATION: Ultrasound-guided core biopsy is recommended and scheduled for the patienton 08/05/2015. I have discussed the findings and recommendations with the patient. Results were also provided in writing at the conclusion of the visit. If applicable, a reminder letter will be sent to the patient regarding the next appointment. BI-RADS CATEGORY 5: Highly suggestive of malignancy. Electronically Signed By: Nolon Nations M.D. On: 07/30/2015 17:04   Mm Diag Breast Tomo Bilateral  07/30/2015 CLINICAL DATA: Palpable abnormality in the left breast. Patient originally noted focal tenderness in the upper-outer quadrant. Palpable mass was noted at the time of her recent physical exam. EXAM: 2D DIGITAL DIAGNOSTIC BILATERAL MAMMOGRAM WITH CAD AND ADJUNCT TOMO ULTRASOUND LEFT BREAST COMPARISON: 01/28/2010 ACR Breast Density Category d: The breast tissue is extremely dense, which lowers the sensitivity of mammography. FINDINGS: Spiculated mass is identified in the upper-outer quadrant of the left breast. Right breast is negative. Mammographic images were processed with CAD. On physical exam, I palpate a discrete mass in the 130 o'clock location of the left breast 10 cm from the nipple. I palpate no axillary adenopathy. Targeted ultrasound is performed, showing an irregular hypoechoic mass with acoustic shadowing the 130 o'clock location of the left breast 10 cm from the nipple measuring 2.4 x 2.1 x 1.8 cm. Internal vascularity identified by Doppler evaluation. Evaluation of the left axilla shows 2 adjacent  lymph nodes with cortical thickening. IMPRESSION: 1. Suspicious mass in the 130 o'clock location of the left breast for which tissue diagnosis is recommended. 2. Suspicious lower left axillary lymph nodes. Tissue diagnosis of 1 of these nodes is recommended. RECOMMENDATION: Ultrasound-guided core biopsy is recommended and scheduled for the patienton 08/05/2015. I have discussed the findings and recommendations with the patient. Results were also provided in writing at the conclusion of the visit. If applicable, a reminder letter will be sent to the patient regarding the next appointment. BI-RADS CATEGORY 5: Highly suggestive of malignancy. Electronically Signed By: Nolon Nations M.D. On: 07/30/2015 17:04   [name] Lt Breast Bx W Loc Dev 1st Lesion Img Bx Spec US Guide  08/06/2015 ADDENDUM REPORT: 08/06/2015 14:38 ADDENDUM: Pathology revealed GRADE II INVASIVE MAMMARY CARCINOMA of the Left breast at the 1:30 o'clock location. ONE LYMPH NODE POSITIVE FOR METASTATIC MAMMARY CARCINOMA of the Left axilla. This was found to be concordant by Dr. Pamelia Hoit. Pathology results were discussed with the  patient by telephone. The patient reported doing well after the biopsy with tenderness at the site. Post biopsy instructions and care were reviewed and questions were answered. The patient was encouraged to call [facility] for any additional concerns. The patient was referred to [facility] on August 11, 2015. Pathology results reported by Terie Purser, RN on 08/06/2015. Electronically Signed By: Pamelia Hoit M.D. On: 08/06/2015 14:38  08/06/2015 CLINICAL DATA: 55 year old female for ultrasound-guided biopsy of an enlarged left axillary lymph node EXAM: ULTRASOUND GUIDED CORE NEEDLE BIOPSY OF A LEFT AXILLARY NODE COMPARISON: Previous exam(s). FINDINGS: I met with the patient and we discussed the procedure of  ultrasound-guided biopsy, including benefits and alternatives. We discussed the high likelihood of a successful procedure. We discussed the risks of the procedure, including infection, bleeding, tissue injury, clip migration, and inadequate sampling. Informed written consent was given. The usual time-out protocol was performed immediately prior to the procedure. Using sterile technique and 1% Lidocaine as local anesthetic, under direct ultrasound visualization, a 14 gauge spring-loaded device was used to perform biopsy of an enlarged left axillary lymph node using a lateral to medial approach. At the conclusion of the procedure a spiral shaped tissue marker clip was deployed into the biopsy cavity. Follow up 2 view mammogram was performed and dictated separately. IMPRESSION: Ultrasound guided biopsy of an enlarged left axillary lymph node. No apparent complications. Electronically Signed: By: Pamelia Hoit M.D. On: 08/05/2015 16:30   [name] Lt Breast Bx W Loc Dev Ea Add Lesion Img Bx Spec US Guide  08/06/2015 ADDENDUM REPORT: 08/06/2015 14:38 ADDENDUM: Pathology revealed GRADE II INVASIVE MAMMARY CARCINOMA of the Left breast at the 1:30 o'clock location. ONE LYMPH NODE POSITIVE FOR METASTATIC MAMMARY CARCINOMA of the Left axilla. This was found to be concordant by Dr. Pamelia Hoit. Pathology results were discussed with the patient by telephone. The patient reported doing well after the biopsy with tenderness at the site. Post biopsy instructions and care were reviewed and questions were answered. The patient was encouraged to call [facility] for any additional concerns. The patient was referred to [facility] at [facility] on August 11, 2015. Pathology results reported by Terie Purser, RN on 08/06/2015. Electronically Signed By: Pamelia Hoit M.D. On: 08/06/2015 14:38  08/06/2015 CLINICAL DATA: 55 year old female for  ultrasound-guided biopsy of a left breast mass at 130 EXAM: ULTRASOUND GUIDED LEFT BREAST CORE NEEDLE BIOPSY COMPARISON: Previous exam(s). FINDINGS: I met with the patient and we discussed the procedure of ultrasound-guided biopsy, including benefits and alternatives. We discussed the high likelihood of a successful procedure. We discussed the risks of the procedure, including infection, bleeding, tissue injury, clip migration, and inadequate sampling. Informed written consent was given. The usual time-out protocol was performed immediately prior to the procedure. Using sterile technique and 1% Lidocaine as local anesthetic, under direct ultrasound visualization, a 12 gauge spring-loaded device was used to perform biopsy of a left breast mass at 130 using a lateral to medial approach. At the conclusion of the procedure a ribbon shaped tissue marker clip was deployed into the biopsy cavity. Follow up 2 view mammogram was performed and dictated separately. IMPRESSION: Ultrasound guided biopsy of a left breast mass at 130. No apparent complications. Electronically Signed: By: Pamelia Hoit M.D. On: 08/05/2015 16:30       IMPRESSION: cT2, N1, M0, triple negative invasive mammary carcinoma  of the left breast.  PLAN: Dr. Lisbeth Renshaw discusses the pathology findings and reviews the nature of invasive breast disease. The consensus from the breast conference include MRI to evaluate for metastatic disease, echocardiogram, PAC placement and Neoadjuvant chemotherapy. Following this, Dr. Lisbeth Renshaw discusses that if she undergoes breast conservation with lumpectomy, we would recommend external radiotherapy to the left breast. He reviews the recommendation of 33 fractions of treatment and that we would recommend meeting back with her about 2 weeks after surgery, and anticipate this beginning 4 weeks after surgery. He also discusses the possible use of breath hold technique. The risks, benefits, short, and long term effects of  radiation therapy are reviewed. At this time, the patient wants to await the results of her MRI before moving forward with scheduling surgery, or with any additional planning in anticipation of treatment.   The above documentation reflects my direct findings during this shared patient visit. Please see the separate note by Dr. Lisbeth Renshaw on this date for the remainder of the patient's plan of care.    Carola Rhine, PAC

## 2015-08-25 NOTE — Addendum Note (Signed)
Encounter addended by: Hayden Pedro, PA-C on: 08/25/2015  3:22 PM<BR>     Documentation filed: Notes Section, Clinical Notes

## 2015-08-25 NOTE — Progress Notes (Signed)
Enrolled pt w/ the [employer] [place] which she is approved for $5000 to cover drugs associated w/ her Dx for 12 months from 08/25/15 w/ a 6 month look back period. [id number] is guaranteed, $3350 is accessible on a first-come first-served basis as long as funding is available. I emailed copy of approval letter & POE to [facility] in billing.  Informed pt of the [place] went over what it will & will not cover. Gave her an expense sheet. Pt will supply proof of income to apply for the [employer].

## 2015-08-26 ENCOUNTER — Encounter (HOSPITAL_COMMUNITY)
Admission: RE | Admit: 2015-08-26 | Discharge: 2015-08-26 | Disposition: A | Discharge status: Home / Self Care | Payer: BLUE CROSS/BLUE SHIELD | Source: Ambulatory Visit | Attending: Oncology | Admitting: Oncology

## 2015-08-26 ENCOUNTER — Encounter (HOSPITAL_COMMUNITY): Payer: BLUE CROSS/BLUE SHIELD

## 2015-08-26 ENCOUNTER — Ambulatory Visit (HOSPITAL_COMMUNITY)
Admission: RE | Admit: 2015-08-26 | Discharge: 2015-08-26 | Disposition: A | Discharge status: Home / Self Care | Payer: BLUE CROSS/BLUE SHIELD | Source: Ambulatory Visit | Attending: Oncology | Admitting: Oncology

## 2015-08-26 ENCOUNTER — Encounter (HOSPITAL_COMMUNITY): Payer: Self-pay

## 2015-08-26 ENCOUNTER — Other Ambulatory Visit: Payer: Self-pay | Admitting: *Deleted

## 2015-08-26 DIAGNOSIS — C50412 Malignant neoplasm of upper-outer quadrant of left female breast: Secondary | ICD-10-CM | POA: Diagnosis present

## 2015-08-26 DIAGNOSIS — R590 Localized enlarged lymph nodes: Secondary | ICD-10-CM | POA: Diagnosis not present

## 2015-08-26 MED ORDER — DIATRIZOATE MEGLUMINE & SODIUM 66-10 % PO SOLN
30.0000 mL | Freq: Once | ORAL | Status: AC
  Administered 2015-08-26: 30 mL via ORAL

## 2015-08-26 MED ORDER — TECHNETIUM TC 99M MEDRONATE IV KIT: 25.0000 | PACK | Freq: Once | INTRAVENOUS | Status: DC | PRN

## 2015-08-26 MED ORDER — IOPAMIDOL (ISOVUE-300) INJECTION 61%
100.0000 mL | Freq: Once | INTRAVENOUS | Status: AC | PRN
  Administered 2015-08-26: 100 mL via INTRAVENOUS

## 2015-08-27 ENCOUNTER — Ambulatory Visit (HOSPITAL_BASED_OUTPATIENT_CLINIC_OR_DEPARTMENT_OTHER): Payer: BLUE CROSS/BLUE SHIELD | Admitting: Nurse Practitioner

## 2015-08-27 ENCOUNTER — Other Ambulatory Visit: Payer: Self-pay | Admitting: Oncology

## 2015-08-27 ENCOUNTER — Other Ambulatory Visit (HOSPITAL_BASED_OUTPATIENT_CLINIC_OR_DEPARTMENT_OTHER): Payer: BLUE CROSS/BLUE SHIELD

## 2015-08-27 ENCOUNTER — Encounter: Payer: Self-pay | Admitting: Nurse Practitioner

## 2015-08-27 ENCOUNTER — Ambulatory Visit (HOSPITAL_BASED_OUTPATIENT_CLINIC_OR_DEPARTMENT_OTHER): Payer: BLUE CROSS/BLUE SHIELD

## 2015-08-27 ENCOUNTER — Encounter: Payer: Self-pay | Admitting: *Deleted

## 2015-08-27 VITALS — BP 146/86 | HR 86 | Temp 98.2°F | Resp 18 | Wt 150.8 lb

## 2015-08-27 DIAGNOSIS — Z5111 Encounter for antineoplastic chemotherapy: Secondary | ICD-10-CM

## 2015-08-27 DIAGNOSIS — C50412 Malignant neoplasm of upper-outer quadrant of left female breast: Secondary | ICD-10-CM

## 2015-08-27 DIAGNOSIS — C773 Secondary and unspecified malignant neoplasm of axilla and upper limb lymph nodes: Secondary | ICD-10-CM | POA: Diagnosis not present

## 2015-08-27 DIAGNOSIS — Z170 Estrogen receptor positive status [ER+]: Secondary | ICD-10-CM

## 2015-08-27 LAB — CBC WITH DIFFERENTIAL/PLATELET
BASO%: 0.7 % (ref 0.0–2.0)
Basophils Absolute: 0 10*3/uL (ref 0.0–0.1)
EOS%: 6 % (ref 0.0–7.0)
Eosinophils Absolute: 0.3 10*3/uL (ref 0.0–0.5)
HCT: 39.8 % (ref 34.8–46.6)
HGB: 13.3 g/dL (ref 11.6–15.9)
LYMPH%: 38.4 % (ref 14.0–49.7)
MCH: 31.1 pg (ref 25.1–34.0)
MCHC: 33.4 g/dL (ref 31.5–36.0)
MCV: 93 fL (ref 79.5–101.0)
MONO#: 0.4 10*3/uL (ref 0.1–0.9)
MONO%: 8.1 % (ref 0.0–14.0)
NEUT%: 46.8 % (ref 38.4–76.8)
NEUTROS ABS: 2.5 10*3/uL (ref 1.5–6.5)
Platelets: 275 10*3/uL (ref 145–400)
RBC: 4.28 10*6/uL (ref 3.70–5.45)
RDW: 13 % (ref 11.2–14.5)
WBC: 5.2 10*3/uL (ref 3.9–10.3)
lymph#: 2 10*3/uL (ref 0.9–3.3)

## 2015-08-27 LAB — COMPREHENSIVE METABOLIC PANEL
ALT: 13 U/L (ref 0–55)
AST: 19 U/L (ref 5–34)
Albumin: 3.9 g/dL (ref 3.5–5.0)
Alkaline Phosphatase: 47 U/L (ref 40–150)
Anion Gap: 9 mEq/L (ref 3–11)
BILIRUBIN TOTAL: 0.31 mg/dL (ref 0.20–1.20)
BUN: 18.2 mg/dL (ref 7.0–26.0)
CO2: 28 meq/L (ref 22–29)
CREATININE: 1 mg/dL (ref 0.6–1.1)
Calcium: 9.5 mg/dL (ref 8.4–10.4)
Chloride: 106 mEq/L (ref 98–109)
EGFR: 77 mL/min/{1.73_m2} — AB (ref >90)
GLUCOSE: 76 mg/dL (ref 70–140)
Potassium: 4.4 mEq/L (ref 3.5–5.1)
SODIUM: 143 meq/L (ref 136–145)
TOTAL PROTEIN: 6.8 g/dL (ref 6.4–8.3)

## 2015-08-27 MED ORDER — PALONOSETRON HCL INJECTION 0.25 MG/5ML
INTRAVENOUS | Status: AC
  Filled 2015-08-27: qty 5

## 2015-08-27 MED ORDER — PEGFILGRASTIM 6 MG/0.6ML ~~LOC~~ PSKT
6.0000 mg | PREFILLED_SYRINGE | Freq: Once | SUBCUTANEOUS | Status: AC
  Administered 2015-08-27: 6 mg via SUBCUTANEOUS
  Filled 2015-08-27: qty 0.6

## 2015-08-27 MED ORDER — DOXORUBICIN HCL CHEMO IV INJECTION 2 MG/ML
60.0000 mg/m2 | Freq: Once | INTRAVENOUS | Status: AC
  Administered 2015-08-27: 100 mg via INTRAVENOUS
  Filled 2015-08-27: qty 50

## 2015-08-27 MED ORDER — HEPARIN SOD (PORK) LOCK FLUSH 100 UNIT/ML IV SOLN
500.0000 [IU] | Freq: Once | INTRAVENOUS | Status: AC | PRN
  Administered 2015-08-27: 500 [IU]
  Filled 2015-08-27: qty 5

## 2015-08-27 MED ORDER — SODIUM CHLORIDE 0.9% FLUSH
10.0000 mL | INTRAVENOUS | Status: DC | PRN
  Administered 2015-08-27: 10 mL
  Filled 2015-08-27: qty 10

## 2015-08-27 MED ORDER — PALONOSETRON HCL INJECTION 0.25 MG/5ML
0.2500 mg | Freq: Once | INTRAVENOUS | Status: AC
  Administered 2015-08-27: 0.25 mg via INTRAVENOUS

## 2015-08-27 MED ORDER — SODIUM CHLORIDE 0.9 % IV SOLN
600.0000 mg/m2 | Freq: Once | INTRAVENOUS | Status: AC
  Administered 2015-08-27: 1000 mg via INTRAVENOUS
  Filled 2015-08-27: qty 50

## 2015-08-27 MED ORDER — SODIUM CHLORIDE 0.9 % IV SOLN
Freq: Once | INTRAVENOUS | Status: AC
Start: 2015-08-27 — End: 2015-08-27
  Administered 2015-08-27: 11:00:00 via INTRAVENOUS

## 2015-08-27 MED ORDER — FOSAPREPITANT DIMEGLUMINE INJECTION 150 MG
Freq: Once | INTRAVENOUS | Status: AC
  Administered 2015-08-27: 12:00:00 via INTRAVENOUS
  Filled 2015-08-27: qty 5

## 2015-08-27 NOTE — Progress Notes (Signed)
Blood return noted before, during and at end of Adriamycin administration.

## 2015-08-27 NOTE — Progress Notes (Signed)
[place]  [phone] [phone]     ID: Gloria Lang DOB: August 10, 1960  MR#: [id number]  VOH#:[id number]  Patient Care Team: Everardo Beals, NP as PCP - General Chauncey Cruel, MD as Consulting Physician (Oncology) Kyung Rudd, MD as Consulting Physician (Radiation Oncology) Stark Klein, MD as Consulting Physician (General Surgery) PCP: Gloria Pillow, NP GYN: OTHER MD:  CHIEF COMPLAINT: Triple negative breast cancer  CURRENT TREATMENT: Neoadjuvant chemotherapy  BREAST CANCER HISTORY: Gloria Lang started experiencing pain in her left breast about a month prior to bringing it to medical attention. The pain would, go but overall it tended to be getting worse. There was no obvious mass or discharge. She finally brought it to [employer] attention in Dr. Ellie Lang office and he was able to palpate a mass. He set the patient up for bilateral diagnostic mammography with tomography and left breast ultrasonography at the [facility] 07/30/2015. The breast density was category D. In the upper-outer quadrant of the left breast there was a spiculated mass which was palpable at the 1:30 o'clock location 10 cm from the nipple. There was no palpable axillary adenopathy. Ultrasound confirmed an irregular hypoechoic mass in the area just mentioned measuring 2.4 cm. The left axilla showed 2 adjacent lymph nodes with cortical thickening.  On 08/05/2015 Gloria Lang underwent biopsy of the left breast mass and also of one of the left axillary lymph nodes in question. The final pathology ([facility] [id number]) showed both biopsies to be involved by invasive mammary carcinoma, which proved to be Gloria-cadherin positive and therefore consistent with a ductal phenotype. Prognostic panels were run on both biopsies. Both were estrogen and progesterone receptor negative. The MIB-1 of the breast mass was 10% but that of the lymph node mass was 60%. HER-2 was negative and both cases, with a  signals ratio is between 1.63 and 1.71, and number per cell between 1.95 and 2.05.  Her subsequent history is as detailed below  INTERVAL HISTORY: Gloria Lang returns for follow up of her breast cancer, accompanied by her significant other Gloria Lang. Today is day 1, cycle 1 of doxorubicin and cyclophosphamide, with neulasta onpro given on day 2 for granulocyte support.  REVIEW OF SYSTEMS: Gloria Lang is anxious about starting treatment today. Her left chest port site is mildly pruritic. There is no rash. She is taking benadryl QHS for relief. A detailed review of systems was otherwise entirely negative. She denies fevers, chills, nausea, vomiting, or changes in bowel or bladder habits. Her appetite is good.   PAST MEDICAL HISTORY: Past Medical History  Diagnosis Date  . Breast cancer of upper-outer quadrant of left female breast ([facility]) 08/07/2015  . Anemia   . Hot flashes   . Family history of adverse reaction to anesthesia     younger sister has same issues with anesthesia  . Complication of anesthesia     patient states she has coded multiple times before with anesthesia. her sister has similar issues.   . Shortness of breath dyspnea     if patient's head is flat (per pt)  . Asthma     only in the summer time- uses albuterol inhaler  . GERD (gastroesophageal reflux disease)   . History of hiatal hernia     had hiatal hernia repair in '94 (per patient)  . Arthritis   . Hx of peptic ulcer   . Hx of transfusion of packed red blood cells   . Sleep apnea     couldn't tolerate CPAP d/t  claustrophobia; sleep upright "90 degree angle": study done at [facility] several years ago    PAST SURGICAL HISTORY: Past Surgical History  Procedure Laterality Date  . Abdominal hysterectomy    . Tubal ligation    . Hernia repair    . Colonoscopy    . Upper gi endoscopy    . Portacath placement N/A 08/20/2015    Procedure: INSERTION PORT-A-CATH;  Surgeon: Stark Klein, MD;  Location: [facility] OR;   Service: General;  Laterality: N/A;    FAMILY HISTORY Family History  Problem Relation Age of Onset  . Colon cancer Brother 34  . Throat cancer Maternal Grandfather   . Lung cancer Maternal Uncle   . Ovarian cancer Sister 46    suspected but not pathologically confirmed  . Lung cancer Paternal Uncle   . Breast cancer Maternal Aunt 69  The patient's father died at age 65, from causes not related to cancer. The patient's mother is age 72 as of April 2017. The patient has 2 brothers, 3 sisters. The patient has 1 aunt diagnosed with breast cancer in her 4s, a paternal grandfather and an uncle with throat cancer, a brother with colon cancer diagnosed in his 19s, and an uncle on the paternal side with lung cancer.   GYNECOLOGIC HISTORY:  No LMP recorded. Patient has had a hysterectomy.  Menarche age 40, first live birth age 80. She is GX P2. The patient underwent total abdominal hysterectomy with bilateral salpingo-oophorectomy for fibroids. However she tells me evaluation 2 years after that surgery showed that she still had a uterus and ovaries. She never called the surgeon ( in [place]) back to discuss this because she says he has retired.   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 At home she lives with her sister Gloria Lang and 2 grandchildren, aged 5 and 53. The patient's children are  Gloria Lang, lives in [place] and works as a Librarian, academic in a plant,  and Gloria Lang, lives in [place] and is disabled. The patient has a total of 13 grandchildren and one great-grandchild. She attends a local [employer]:  not in place    HEALTH MAINTENANCE: Social History  Substance Use Topics  . Smoking status: Never Smoker   . Smokeless tobacco: Not on file  . Alcohol Use: No     Colonoscopy: 2006?  PAP: 2014  Bone density:  Lipid panel:  No Known Allergies  Current Outpatient Prescriptions  Medication Sig Dispense Refill  .  dexamethasone (DECADRON) 4 MG tablet Take 2 tablets by mouth once a day on the day after chemotherapy and then take 2 tablets two times a day for 2 days. Take with food. 30 tablet 1  . diphenhydrAMINE (BENADRYL) 25 mg capsule Take 25 mg by mouth at bedtime.    . lidocaine-prilocaine (EMLA) cream Apply to affected area once 30 g 3  . loratadine (CLARITIN) 10 MG tablet Take 1 tablet (10 mg total) by mouth daily. 30 tablet 1  . Multiple Vitamin (MULTIVITAMIN WITH MINERALS) TABS tablet Take 1 tablet by mouth daily.    . ondansetron (ZOFRAN) 8 MG tablet Take 1 tablet (8 mg total) by mouth 2 (two) times daily as needed. Start on the third day after chemotherapy. 30 tablet 1  . prochlorperazine (COMPAZINE) 10 MG tablet Take 1 tablet (10 mg total) by mouth every 6 (six) hours as needed (Nausea or vomiting). 30 tablet 1  . oxyCODONE-acetaminophen (ROXICET) 5-325 MG  tablet Take 1-2 tablets by mouth every 4 (four) hours as needed for severe pain. (Patient not taking: Reported on 08/27/2015) 30 tablet 0   No current facility-administered medications for this visit.   Facility-Administered Medications Ordered in Other Visits  Medication Dose Route Frequency Provider Last Rate Last Dose  . technetium medronate (TC-MDP) injection 25 milli Curie  25 milli Curie Intravenous Once PRN Chauncey Cruel, MD        OBJECTIVE: Middle-aged African-American woman who appears well  Filed Vitals:   08/27/15 1036  BP: 146/86  Lang: 86  Temp: 98.2 F (36.8 C)  Resp: 18     Body mass index is 29.45 kg/(m^2).    ECOG FS:0 - Asymptomatic   Skin: warm, dry  HEENT: sclerae anicteric, conjunctivae pink, oropharynx clear. No thrush or mucositis.  Lymph Nodes: No cervical or supraclavicular lymphadenopathy  Lungs: clear to auscultation bilaterally, no rales, wheezes, or rhonci  Heart: regular rate and rhythm  Abdomen: round, soft, non tender, positive bowel sounds  Musculoskeletal: No focal spinal tenderness, no  peripheral edema  Neuro: non focal, well oriented, positive affect  Breasts: deferred. Newly placed left upper chest portacath. Clean dry and intact.  LAB RESULTS:  CMP     Component Value Date/Time   NA 143 08/27/2015 1016   NA 142 08/19/2015 1417   K 4.4 08/27/2015 1016   K 4.9 08/19/2015 1417   CL 106 08/19/2015 1417   CO2 28 08/27/2015 1016   CO2 26 08/19/2015 1417   GLUCOSE 76 08/27/2015 1016   GLUCOSE 82 08/19/2015 1417   BUN 18.2 08/27/2015 1016   BUN 13 08/19/2015 1417   CREATININE 1.0 08/27/2015 1016   CREATININE 0.90 08/19/2015 1417   CALCIUM 9.5 08/27/2015 1016   CALCIUM 10.1 08/19/2015 1417   PROT 6.8 08/27/2015 1016   ALBUMIN 3.9 08/27/2015 1016   AST 19 08/27/2015 1016   ALT 13 08/27/2015 1016   ALKPHOS 47 08/27/2015 1016   BILITOT 0.31 08/27/2015 1016   GFRNONAA >60 08/19/2015 1417   GFRAA >60 08/19/2015 1417    INo results found for: SPEP, UPEP  Lab Results  Component Value Date   WBC 5.2 08/27/2015   NEUTROABS 2.5 08/27/2015   HGB 13.3 08/27/2015   HCT 39.8 08/27/2015   MCV 93.0 08/27/2015   PLT 275 08/27/2015      Chemistry      Component Value Date/Time   NA 143 08/27/2015 1016   NA 142 08/19/2015 1417   K 4.4 08/27/2015 1016   K 4.9 08/19/2015 1417   CL 106 08/19/2015 1417   CO2 28 08/27/2015 1016   CO2 26 08/19/2015 1417   BUN 18.2 08/27/2015 1016   BUN 13 08/19/2015 1417   CREATININE 1.0 08/27/2015 1016   CREATININE 0.90 08/19/2015 1417      Component Value Date/Time   CALCIUM 9.5 08/27/2015 1016   CALCIUM 10.1 08/19/2015 1417   ALKPHOS 47 08/27/2015 1016   AST 19 08/27/2015 1016   ALT 13 08/27/2015 1016   BILITOT 0.31 08/27/2015 1016       No results found for: LABCA2  No components found for: LABCA125  No results for input(s): INR in the last 168 hours.  Urinalysis No results found for: COLORURINE, APPEARANCEUR, LABSPEC, PHURINE, GLUCOSEU, HGBUR, BILIRUBINUR, KETONESUR, PROTEINUR, UROBILINOGEN, NITRITE,  LEUKOCYTESUR    ELIGIBLE FOR AVAILABLE RESEARCH PROTOCOL: B 51, [facility]  STUDIES: Dg Chest 1 View  08/20/2015  CLINICAL DATA:  Followup pneumothorax. EXAM: FLOURO  GUIDE CV LINE - NO REPORT; CHEST  1 VIEW COMPARISON:  08/20/2015 FINDINGS: The power port tip is in the distal SVC. The cardiac silhouette, mediastinal and hilar contours are normal. The lungs are clear. No pneumothorax identified. IMPRESSION: No acute cardiopulmonary findings.  No pneumothorax. Electronically Signed   By: Marijo Sanes M.D.   On: 08/20/2015 17:09   Ct Chest W Contrast  08/26/2015  CLINICAL DATA:  New diagnosis of LEFT breast cancer. Diagnosis March 2017. Hysterectomy. EXAM: CT CHEST, ABDOMEN, AND PELVIS WITH CONTRAST TECHNIQUE: Multidetector CT imaging of the chest, abdomen and pelvis was performed following the standard protocol during bolus administration of intravenous contrast. CONTRAST:  122m ISOVUE-300 IOPAMIDOL (ISOVUE-300) INJECTION 61% COMPARISON:  None. FINDINGS: CT CHEST FINDINGS Mediastinum/Nodes: The port in the LEFT chest wall. Biopsy clip in the posterior lateral aspect of the LEFT breast within a small rounded mass measuring 2 cm. Cluster rounded LEFT axillary lymph nodes in the high axilla. For example 8 mm lymph node image 17, series 2 and 8 mm lymph node on image 21 series 2. Sub pectoralis lymph node measuring 6 mm on image 12, series 2. No enlarged internal mammary lymph nodes. No supraclavicular lymph nodes identified. No mediastinal hilar lymph nodes. No pericardial fluid. No central pulmonary embolism.  Esophagus normal. Lungs/Pleura: No suspicious pulmonary nodule. Airways normal. No pleural abnormality. Musculoskeletal: No aggressive osseous lesion. CT ABDOMEN AND PELVIS FINDINGS Hepatobiliary: No focal hepatic lesion. No biliary ductal dilatation. Gallbladder is normal. Common bile duct is normal. Pancreas: Pancreas is normal. No ductal dilatation. No pancreatic inflammation. Spleen: Normal spleen  Adrenals/urinary tract: Adrenal glands and kidneys are normal. The ureters and bladder normal. Stomach/Bowel: Stomach, small bowel, appendix, and cecum are normal. The colon and rectosigmoid colon are normal. Vascular/Lymphatic: Abdominal aorta is normal caliber. There is no retroperitoneal or periportal lymphadenopathy. No pelvic lymphadenopathy. Reproductive: Post hysterectomy anatomy.  Ovaries not identified. Other: No free fluid. Musculoskeletal: No aggressive osseous lesion. IMPRESSION: Chest Impression: 1. Biopsy clip within small lateral left breast mass. 2. Several high LEFT axillary lymph nodes have suspicious morphology but are not pathologic by CT size criteria. 3. No evidence of central nodal metastasis. 4. No pulmonary metastasis. Abdomen / Pelvis Impression: 1. No evidence metastasis in soft tissues of the abdomen or pelvis. 2. No skeletal metastasis. Electronically Signed   By: SSuzy BouchardM.D.   On: 08/26/2015 13:01   Nm Bone Scan Whole Body  08/26/2015  CLINICAL DATA:  New diagnosis of left breast carcinoma EXAM: NUCLEAR MEDICINE WHOLE BODY BONE SCAN TECHNIQUE: Whole body anterior and posterior images were obtained approximately 3 hours after intravenous injection of radiopharmaceutical. RADIOPHARMACEUTICALS:  26.0 mCi Technetium-943mDP IV COMPARISON:  CT chest abdomen pelvis of 08/26/2015 FINDINGS: The patient was injected with 2656.4illicuries of technetium 9923 MDP intravenously and total body bone scan was performed. Minimal activity is noted in the lower cervical spine. Also there is faint activity in the region of L2 and L3 vertebral bodies. This activity most likely is due to degenerative change with no lytic or blastic lesion noted on bone window images from today's CT. Mild activity is also noted in the left femoral greater trochanter and no lytic or blastic lesion is noted at that site on today's CT scan. IMPRESSION: 1. No definite bone metastasis are noted. 2. Foci of of  increased activity in the lower cervical spine, mid upper lumbar spine, and left femoral greater trochanter most likely all are degenerative in origin with no  lytic or blastic lesion demonstrated on bone window images from today's CT at those sites. Electronically Signed   By: Ivar Drape M.D.   On: 08/26/2015 13:29   Ct Abdomen Pelvis W Contrast  08/26/2015  CLINICAL DATA:  New diagnosis of LEFT breast cancer. Diagnosis March 2017. Hysterectomy. EXAM: CT CHEST, ABDOMEN, AND PELVIS WITH CONTRAST TECHNIQUE: Multidetector CT imaging of the chest, abdomen and pelvis was performed following the standard protocol during bolus administration of intravenous contrast. CONTRAST:  130m ISOVUE-300 IOPAMIDOL (ISOVUE-300) INJECTION 61% COMPARISON:  None. FINDINGS: CT CHEST FINDINGS Mediastinum/Nodes: The port in the LEFT chest wall. Biopsy clip in the posterior lateral aspect of the LEFT breast within a small rounded mass measuring 2 cm. Cluster rounded LEFT axillary lymph nodes in the high axilla. For example 8 mm lymph node image 17, series 2 and 8 mm lymph node on image 21 series 2. Sub pectoralis lymph node measuring 6 mm on image 12, series 2. No enlarged internal mammary lymph nodes. No supraclavicular lymph nodes identified. No mediastinal hilar lymph nodes. No pericardial fluid. No central pulmonary embolism.  Esophagus normal. Lungs/Pleura: No suspicious pulmonary nodule. Airways normal. No pleural abnormality. Musculoskeletal: No aggressive osseous lesion. CT ABDOMEN AND PELVIS FINDINGS Hepatobiliary: No focal hepatic lesion. No biliary ductal dilatation. Gallbladder is normal. Common bile duct is normal. Pancreas: Pancreas is normal. No ductal dilatation. No pancreatic inflammation. Spleen: Normal spleen Adrenals/urinary tract: Adrenal glands and kidneys are normal. The ureters and bladder normal. Stomach/Bowel: Stomach, small bowel, appendix, and cecum are normal. The colon and rectosigmoid colon are normal.  Vascular/Lymphatic: Abdominal aorta is normal caliber. There is no retroperitoneal or periportal lymphadenopathy. No pelvic lymphadenopathy. Reproductive: Post hysterectomy anatomy.  Ovaries not identified. Other: No free fluid. Musculoskeletal: No aggressive osseous lesion. IMPRESSION: Chest Impression: 1. Biopsy clip within small lateral left breast mass. 2. Several high LEFT axillary lymph nodes have suspicious morphology but are not pathologic by CT size criteria. 3. No evidence of central nodal metastasis. 4. No pulmonary metastasis. Abdomen / Pelvis Impression: 1. No evidence metastasis in soft tissues of the abdomen or pelvis. 2. No skeletal metastasis. Electronically Signed   By: SSuzy BouchardM.D.   On: 08/26/2015 13:01   Mr Breast Bilateral W Wo Contrast  08/19/2015  CLINICAL DATA:  Biopsy proven grade 2 invasive mammary carcinoma in the 1:30 o'clock location of the left breast. Metastatic mammary carcinoma biopsied from 1 axillary lymph node. LABS:  None obtained at the time of imaging. EXAM: BILATERAL BREAST MRI WITH AND WITHOUT CONTRAST TECHNIQUE: Multiplanar, multisequence MR images of both breasts were obtained prior to and following the intravenous administration of 13 ml of MultiHance. THREE-DIMENSIONAL MR IMAGE RENDERING ON INDEPENDENT WORKSTATION: Three-dimensional MR images were rendered by post-processing of the original MR data on an independent workstation. The three-dimensional MR images were interpreted, and findings are reported in the following complete MRI report for this study. Three dimensional images were evaluated at the independent DynaC[employer] workstation COMPARISON:  Previous exam(s). FINDINGS: Breast composition: c. Heterogeneous fibroglandular tissue. Background parenchymal enhancement: Moderate. Right breast: No mass or abnormal enhancement. Left breast: There is a 7.6 x 4.2 x 2.3 (anterior posterior, transverse and craniocaudal dimensions) irregular enhancing mass in the  middle and posterior thirds of the lateral aspect of the left breast corresponding with the biopsy proven invasive mammary carcinoma. Signal void artifact from the biopsy clip is seen in the posterior aspect of the enhancing mass. Lymph nodes: Several enlarged level 1 left axillary lymph  nodes are visualized. One of them has biopsy changes and a signal void artifact from the biopsy clip. The largest lymph node measures 1.6 cm. Ancillary findings:  None. IMPRESSION: 7.6 cm enhancing irregular mass in the lateral aspect of the left breast corresponding with the biopsy proven invasive mammary carcinoma. Enlarged left axillary adenopathy corresponding with the known metastatic disease. RECOMMENDATION: Treatment planning of the known left breast invasive mammary carcinoma and metastatic axillary adenopathy is recommended. The surgical clip is located along the posterior aspect of the carcinoma. If pathologically proving the extent of disease is clinically indicated the anterior aspect of the enhancing mass could be biopsied with MR guidance. BI-RADS CATEGORY  6: Known biopsy-proven malignancy. Electronically Signed   By: Lillia Mountain M.D.   On: 08/19/2015 11:34   Dg Chest Port 1 View  08/20/2015  CLINICAL DATA:  Left Port-A-Cath insertion EXAM: PORTABLE CHEST 1 VIEW COMPARISON:  None. FINDINGS: Left subclavian Port-A-Cath terminates at the cavoatrial junction. Normal heart size. Normal mediastinal contour. No right pneumothorax. Questionable asymmetric lucency at the left lung apex, cannot exclude a tiny left apical pneumothorax. No pleural effusion. No pulmonary edema. Mild patchy opacity at the lateral left lung base. IMPRESSION: 1. Questionable asymmetric lucency at the left lung apex, cannot exclude a tiny left apical pneumothorax. Short-term follow-up chest radiograph advised. 2. Well-positioned left subclavian Port-A-Cath. 3. Mild patchy left lung base opacity, favor atelectasis. These results were called by  telephone at the time of interpretation on 08/20/2015 at 1:18 pm to RN [facility], who verbally acknowledged these results. Electronically Signed   By: Ilona Sorrel M.D.   On: 08/20/2015 13:19   Mm Digital Diagnostic Unilat L  08/05/2015  CLINICAL DATA:  55 year old female status post ultrasound-guided biopsies of a left breast mass at 130 and of an enlarged left axillary lymph node. EXAM: DIAGNOSTIC LEFT MAMMOGRAM POST ULTRASOUND BIOPSY COMPARISON:  Previous exam(s). FINDINGS: Mammographic images were obtained following ultrasound guided biopsies of a left breast mass at 130 and of an enlarged left axillary lymph node. Post biopsy mammogram demonstrates the ribbon shaped biopsy marker to be within the left breast mass at 130 and the spiral shaped biopsy marker to be in the expected position of an enlarged left axillary lymph node. IMPRESSION: Appropriate marker position as above. Final Assessment: Post Procedure Mammograms for Marker Placement Electronically Signed   By: Pamelia Hoit M.D.   On: 08/05/2015 16:31   Dg Fluoro Guide Cv Line-no Report  08/20/2015  CLINICAL DATA:  FLOURO GUIDE CV LINE Fluoroscopy was utilized by the requesting physician.  No radiographic interpretation.   US Breast Ltd Uni Left Inc Axilla  07/30/2015  CLINICAL DATA:  Palpable abnormality in the left breast. Patient originally noted focal tenderness in the upper-outer quadrant. Palpable mass was noted at the time of her recent physical exam. EXAM: 2D DIGITAL DIAGNOSTIC BILATERAL MAMMOGRAM WITH CAD AND ADJUNCT TOMO ULTRASOUND LEFT BREAST COMPARISON:  01/28/2010 ACR Breast Density Category d: The breast tissue is extremely dense, which lowers the sensitivity of mammography. FINDINGS: Spiculated mass is identified in the upper-outer quadrant of the left breast. Right breast is negative. Mammographic images were processed with CAD. On physical exam, I palpate a discrete mass in the 130 o'clock location of the left breast 10 cm from the  nipple. I palpate no axillary adenopathy. Targeted ultrasound is performed, showing an irregular hypoechoic mass with acoustic shadowing the 130 o'clock location of the left breast 10 cm from the nipple measuring 2.4 x  2.1 x 1.8 cm. Internal vascularity identified by Doppler evaluation. Evaluation of the left axilla shows 2 adjacent lymph nodes with cortical thickening. IMPRESSION: 1. Suspicious mass in the 130 o'clock location of the left breast for which tissue diagnosis is recommended. 2. Suspicious lower left axillary lymph nodes. Tissue diagnosis of 1 of these nodes is recommended. RECOMMENDATION: Ultrasound-guided core biopsy is recommended and scheduled for the patienton 08/05/2015. I have discussed the findings and recommendations with the patient. Results were also provided in writing at the conclusion of the visit. If applicable, a reminder letter will be sent to the patient regarding the next appointment. BI-RADS CATEGORY  5: Highly suggestive of malignancy. Electronically Signed   By: Nolon Nations M.D.   On: 07/30/2015 17:04   Mm Diag Breast Tomo Bilateral  07/30/2015  CLINICAL DATA:  Palpable abnormality in the left breast. Patient originally noted focal tenderness in the upper-outer quadrant. Palpable mass was noted at the time of her recent physical exam. EXAM: 2D DIGITAL DIAGNOSTIC BILATERAL MAMMOGRAM WITH CAD AND ADJUNCT TOMO ULTRASOUND LEFT BREAST COMPARISON:  01/28/2010 ACR Breast Density Category d: The breast tissue is extremely dense, which lowers the sensitivity of mammography. FINDINGS: Spiculated mass is identified in the upper-outer quadrant of the left breast. Right breast is negative. Mammographic images were processed with CAD. On physical exam, I palpate a discrete mass in the 130 o'clock location of the left breast 10 cm from the nipple. I palpate no axillary adenopathy. Targeted ultrasound is performed, showing an irregular hypoechoic mass with acoustic shadowing the 130  o'clock location of the left breast 10 cm from the nipple measuring 2.4 x 2.1 x 1.8 cm. Internal vascularity identified by Doppler evaluation. Evaluation of the left axilla shows 2 adjacent lymph nodes with cortical thickening. IMPRESSION: 1. Suspicious mass in the 130 o'clock location of the left breast for which tissue diagnosis is recommended. 2. Suspicious lower left axillary lymph nodes. Tissue diagnosis of 1 of these nodes is recommended. RECOMMENDATION: Ultrasound-guided core biopsy is recommended and scheduled for the patienton 08/05/2015. I have discussed the findings and recommendations with the patient. Results were also provided in writing at the conclusion of the visit. If applicable, a reminder letter will be sent to the patient regarding the next appointment. BI-RADS CATEGORY  5: Highly suggestive of malignancy. Electronically Signed   By: Nolon Nations M.D.   On: 07/30/2015 17:04   [name] Lt Breast Bx W Loc Dev 1st Lesion Img Bx Spec US Guide  08/06/2015  ADDENDUM REPORT: 08/06/2015 14:38 ADDENDUM: Pathology revealed GRADE II INVASIVE MAMMARY CARCINOMA of the Left breast at the 1:30 o'clock location. ONE LYMPH NODE POSITIVE FOR METASTATIC MAMMARY CARCINOMA of the Left axilla. This was found to be concordant by Dr. Pamelia Hoit. Pathology results were discussed with the patient by telephone. The patient reported doing well after the biopsy with tenderness at the site. Post biopsy instructions and care were reviewed and questions were answered. The patient was encouraged to call [facility] for any additional concerns. The patient was referred to [facility] at [facility] on August 11, 2015. Pathology results reported by Terie Purser, RN on 08/06/2015. Electronically Signed   By: Pamelia Hoit M.D.   On: 08/06/2015 14:38  08/06/2015  CLINICAL DATA:  55 year old female for ultrasound-guided biopsy of an enlarged left  axillary lymph node EXAM: ULTRASOUND GUIDED CORE NEEDLE BIOPSY OF A LEFT AXILLARY NODE COMPARISON:  Previous exam(s).  FINDINGS: I met with the patient and we discussed the procedure of ultrasound-guided biopsy, including benefits and alternatives. We discussed the high likelihood of a successful procedure. We discussed the risks of the procedure, including infection, bleeding, tissue injury, clip migration, and inadequate sampling. Informed written consent was given. The usual time-out protocol was performed immediately prior to the procedure. Using sterile technique and 1% Lidocaine as local anesthetic, under direct ultrasound visualization, a 14 gauge spring-loaded device was used to perform biopsy of an enlarged left axillary lymph node using a lateral to medial approach. At the conclusion of the procedure a spiral shaped tissue marker clip was deployed into the biopsy cavity. Follow up 2 view mammogram was performed and dictated separately. IMPRESSION: Ultrasound guided biopsy of an enlarged left axillary lymph node. No apparent complications. Electronically Signed: By: Pamelia Hoit M.D. On: 08/05/2015 16:30   [name] Lt Breast Bx W Loc Dev Ea Add Lesion Img Bx Spec US Guide  08/06/2015  ADDENDUM REPORT: 08/06/2015 14:38 ADDENDUM: Pathology revealed GRADE II INVASIVE MAMMARY CARCINOMA of the Left breast at the 1:30 o'clock location. ONE LYMPH NODE POSITIVE FOR METASTATIC MAMMARY CARCINOMA of the Left axilla. This was found to be concordant by Dr. Pamelia Hoit. Pathology results were discussed with the patient by telephone. The patient reported doing well after the biopsy with tenderness at the site. Post biopsy instructions and care were reviewed and questions were answered. The patient was encouraged to call [facility] for any additional concerns. The patient was referred to [facility] at [facility] on August 11, 2015. Pathology  results reported by Terie Purser, RN on 08/06/2015. Electronically Signed   By: Pamelia Hoit M.D.   On: 08/06/2015 14:38  08/06/2015  CLINICAL DATA:  55 year old female for ultrasound-guided biopsy of a left breast mass at 130 EXAM: ULTRASOUND GUIDED LEFT BREAST CORE NEEDLE BIOPSY COMPARISON:  Previous exam(s). FINDINGS: I met with the patient and we discussed the procedure of ultrasound-guided biopsy, including benefits and alternatives. We discussed the high likelihood of a successful procedure. We discussed the risks of the procedure, including infection, bleeding, tissue injury, clip migration, and inadequate sampling. Informed written consent was given. The usual time-out protocol was performed immediately prior to the procedure. Using sterile technique and 1% Lidocaine as local anesthetic, under direct ultrasound visualization, a 12 gauge spring-loaded device was used to perform biopsy of a left breast mass at 130 using a lateral to medial approach. At the conclusion of the procedure a ribbon shaped tissue marker clip was deployed into the biopsy cavity. Follow up 2 view mammogram was performed and dictated separately. IMPRESSION: Ultrasound guided biopsy of a left breast mass at 130. No apparent complications. Electronically Signed: By: Pamelia Hoit M.D. On: 08/05/2015 16:30    ASSESSMENT: 55 y.o. [place] woman status post left breast upper outer quadrant and left axillary lymph node biopsy 08/05/2015, both positive for an invasive ductal carcinoma, grade 2 or 3, triple negative, with an MIB-1 between 10 and 60%   (1) neoadjuvant chemotherapy will consist of doxorubicin and cyclophosphamide in dose dense fashion 4 followed by weekly paclitaxel and carboplatin 12  (2) definitive surgery we will follow chemotherapy  (3) adjuvant radiation to follow as appropriate  (4) genetics testing pending  PLAN: Hayli and I reviewed the results of her recent scans. She was relieved to hear there is no  evidence of distant metastasis. Her echocardiogram is within normal limits.  The labs were reviewed in detail and were entirely normal as well. She will proceed with cycle 1 of doxorubicin and cyclophosphamide as planned today.   Rayhana will return in 1 week for a nadir visit with Dr. Jana Hakim. Due to scheduling conflicts the labs will be performed after this visit. She understands and agrees with this plan. She knows the goal of treatment in her case is cure. She has been encouraged to call with any issues that might arise before her next visit here.   Laurie Panda, NP   08/27/2015 10:51 AM

## 2015-08-27 NOTE — Patient Instructions (Signed)
[facility] Discharge Instructions for Patients Receiving Chemotherapy  Today you received the following chemotherapy agents Adriamycin/Cytoxan.  To help prevent nausea and vomiting after your treatment, we encourage you to take your nausea medication as prescribed.    If you develop nausea and vomiting that is not controlled by your nausea medication, call the clinic.   BELOW ARE SYMPTOMS THAT SHOULD BE REPORTED IMMEDIATELY:  *FEVER GREATER THAN 100.5 F  *CHILLS WITH OR WITHOUT FEVER  NAUSEA AND VOMITING THAT IS NOT CONTROLLED WITH YOUR NAUSEA MEDICATION  *UNUSUAL SHORTNESS OF BREATH  *UNUSUAL BRUISING OR BLEEDING  TENDERNESS IN MOUTH AND THROAT WITH OR WITHOUT PRESENCE OF ULCERS  *URINARY PROBLEMS  *BOWEL PROBLEMS  UNUSUAL RASH Items with * indicate a potential emergency and should be followed up as soon as possible.  Feel free to call the clinic you have any questions or concerns. The clinic phone number is [phone].  Please show the CHEMO ALERT CARD at check-in to the Emergency Department and triage nurse.   

## 2015-08-28 ENCOUNTER — Encounter: Payer: Self-pay | Admitting: Oncology

## 2015-08-28 ENCOUNTER — Telehealth: Payer: Self-pay

## 2015-08-28 NOTE — Telephone Encounter (Signed)
Called [facility] for chemotherapy F/U.  Patient is doing well.  Denies n/v.  Denies any new side effects or symptoms.  Bowel and bladder is functioning well.  Eating and drinking well and I instructed to drink 64 oz minimum daily or at least the day before, of and after treatment.  Denies questions at this time and encouraged to call if needed.  Reviewed how to call after hours in the case of an emergency.

## 2015-08-28 NOTE — Telephone Encounter (Signed)
-----   Message from Renford Dills, RN sent at 08/27/2015 12:22 PM EDT ----- Regarding: chemo f/u [facility] 1st A/C

## 2015-08-28 NOTE — Progress Notes (Signed)
Pt is approved for the $1000 [employer] grant.  

## 2015-09-02 ENCOUNTER — Telehealth: Payer: Self-pay | Admitting: *Deleted

## 2015-09-02 NOTE — Telephone Encounter (Signed)
Received call from patient stating she feels tired sometimes.  Informed her that this was normal and to rest when she feels like she needs to.  She verbalized understanding.

## 2015-09-03 ENCOUNTER — Encounter: Payer: Self-pay | Admitting: *Deleted

## 2015-09-03 ENCOUNTER — Ambulatory Visit (HOSPITAL_BASED_OUTPATIENT_CLINIC_OR_DEPARTMENT_OTHER): Payer: BLUE CROSS/BLUE SHIELD | Admitting: Oncology

## 2015-09-03 ENCOUNTER — Other Ambulatory Visit (HOSPITAL_BASED_OUTPATIENT_CLINIC_OR_DEPARTMENT_OTHER): Payer: BLUE CROSS/BLUE SHIELD

## 2015-09-03 ENCOUNTER — Other Ambulatory Visit: Payer: Self-pay | Admitting: *Deleted

## 2015-09-03 ENCOUNTER — Ambulatory Visit: Payer: BLUE CROSS/BLUE SHIELD

## 2015-09-03 VITALS — BP 146/86 | HR 89 | Temp 97.9°F | Resp 18 | Ht 60.0 in | Wt 148.6 lb

## 2015-09-03 DIAGNOSIS — C773 Secondary and unspecified malignant neoplasm of axilla and upper limb lymph nodes: Secondary | ICD-10-CM | POA: Diagnosis not present

## 2015-09-03 DIAGNOSIS — C50412 Malignant neoplasm of upper-outer quadrant of left female breast: Secondary | ICD-10-CM

## 2015-09-03 DIAGNOSIS — G4700 Insomnia, unspecified: Secondary | ICD-10-CM

## 2015-09-03 DIAGNOSIS — K5900 Constipation, unspecified: Secondary | ICD-10-CM

## 2015-09-03 DIAGNOSIS — Z171 Estrogen receptor negative status [ER-]: Secondary | ICD-10-CM | POA: Diagnosis not present

## 2015-09-03 LAB — CBC WITH DIFFERENTIAL/PLATELET
BASO%: 0.1 % (ref 0.0–2.0)
BASOS ABS: 0 10*3/uL (ref 0.0–0.1)
EOS%: 0 % (ref 0.0–7.0)
Eosinophils Absolute: 0 10*3/uL (ref 0.0–0.5)
HCT: 38.1 % (ref 34.8–46.6)
HEMOGLOBIN: 12.6 g/dL (ref 11.6–15.9)
LYMPH%: 27.4 % (ref 14.0–49.7)
MCH: 30.7 pg (ref 25.1–34.0)
MCHC: 33.2 g/dL (ref 31.5–36.0)
MCV: 92.5 fL (ref 79.5–101.0)
MONO#: 0.1 10*3/uL (ref 0.1–0.9)
MONO%: 5.5 % (ref 0.0–14.0)
NEUT%: 67 % (ref 38.4–76.8)
NEUTROS ABS: 1.8 10*3/uL (ref 1.5–6.5)
Platelets: 189 10*3/uL (ref 145–400)
RBC: 4.11 10*6/uL (ref 3.70–5.45)
RDW: 12.5 % (ref 11.2–14.5)
WBC: 2.7 10*3/uL — AB (ref 3.9–10.3)
lymph#: 0.7 10*3/uL — ABNORMAL LOW (ref 0.9–3.3)

## 2015-09-03 MED ORDER — ZOLPIDEM TARTRATE 5 MG PO TABS: 5.0000 mg | ORAL_TABLET | Freq: Every evening | ORAL | Status: AC | PRN

## 2015-09-03 MED FILL — ZOLPIDEM TARTRATE 5 MG TAB: 5 | 15 days supply | Qty: 30 | Fill #0

## 2015-09-03 NOTE — Progress Notes (Signed)
[place]  [phone] [phone]     ID: Gloria Lang DOB: 1961/04/04  MR#: [id number]  BTD#:[id number]  Patient Care Team: Gloria Beals, NP as PCP - General Gloria Cruel, MD as Consulting Physician (Oncology) Gloria Rudd, MD as Consulting Physician (Radiation Oncology) Gloria Klein, MD as Consulting Physician (General Surgery) PCP: Gloria Pillow, NP GYN: OTHER MD:  CHIEF COMPLAINT: Triple negative breast cancer  CURRENT TREATMENT: Neoadjuvant chemotherapy  BREAST CANCER HISTORY: From the original intake note:  Gloria Lang started experiencing pain in her left breast about a month prior to bringing it to medical attention. The pain would, go but overall it tended to be getting worse. There was no obvious mass or discharge. She finally brought it to [employer] attention in Dr. Ellie Lunch office and he was able to palpate a mass. He set the patient up for bilateral diagnostic mammography with tomography and left breast ultrasonography at the [facility] 07/30/2015. The breast density was category D. In the upper-outer quadrant of the left breast there was a spiculated mass which was palpable at the 1:30 o'clock location 10 cm from the nipple. There was no palpable axillary adenopathy. Ultrasound confirmed an irregular hypoechoic mass in the area just mentioned measuring 2.4 cm. The left axilla showed 2 adjacent lymph nodes with cortical thickening.  On 08/05/2015 Gloria Lang underwent biopsy of the left breast mass and also of one of the left axillary lymph nodes in question. The final pathology ([facility] [phone]) showed both biopsies to be involved by invasive mammary carcinoma, which proved to be E-cadherin positive and therefore consistent with a ductal phenotype. Prognostic panels were run on both biopsies. Both were estrogen and progesterone receptor negative. The MIB-1 of the breast mass was 10% but that of the lymph node mass was 60%. HER-2 was  negative and both cases, with a signals ratio is between 1.63 and 1.71, and number per cell between 1.95 and 2.05.  Her subsequent history is as detailed below  INTERVAL HISTORY: Gloria Lang returns for follow up of her left-sided breast cancer, accompanied by her significant other Gloria Lang. Today is day 8, cycle 1 of 4 planned cycles of doxorubicin and cyclophosphamide, with neulasta onpro given on day 2 for granulocyte support, to be followed by 12 weekly cycles of carboplatin and paclitaxel  REVIEW OF SYSTEMS: Gloria Lang remarkably well with her first cycle. She continued to work full-time. She felt a little tired but she did all her usual activities. She had no nausea or vomiting. Occasionally she would have a little funny feeling in her "esophagus" and when that happens she takes prochlorperazine and it clears up. Her appetite is good and she has had no taste alteration. She couldn't sleep for 2 days. She did not get the lorazepam refill because she says in the past that has been "way too much for her". She can be a little short of breath at times but that is not a new problem and it is not more persistent or intense than before.. There has been no cough or pleurisy. She was constipated for 2 days which she takes milk of magnesia if she does not have a bowel movement beyond 2 days. That took care of the problem. Otherwise a detailed review of systems today was stable  PAST MEDICAL HISTORY: Past Medical History  Diagnosis Date  . Breast cancer of upper-outer quadrant of left female breast ([place]) 08/07/2015  . Anemia   . Hot flashes   . Family history of  adverse reaction to anesthesia     younger sister has same issues with anesthesia  . Complication of anesthesia     patient states she has coded multiple times before with anesthesia. her sister has similar issues.   . Shortness of breath dyspnea     if patient's head is flat (per pt)  . Asthma     only in the summer time- uses albuterol  inhaler  . GERD (gastroesophageal reflux disease)   . History of hiatal hernia     had hiatal hernia repair in '94 (per patient)  . Arthritis   . Hx of peptic ulcer   . Hx of transfusion of packed red blood cells   . Sleep apnea     couldn't tolerate CPAP d/t claustrophobia; sleep upright "90 degree angle": study done at [facility] several years ago    PAST SURGICAL HISTORY: Past Surgical History  Procedure Laterality Date  . Abdominal hysterectomy    . Tubal ligation    . Hernia repair    . Colonoscopy    . Upper gi endoscopy    . Portacath placement N/A 08/20/2015    Procedure: INSERTION PORT-A-CATH;  Surgeon: Gloria Klein, MD;  Location: [facility] OR;  Service: General;  Laterality: N/A;    FAMILY HISTORY Family History  Problem Relation Age of Onset  . Colon cancer Brother 31  . Throat cancer Maternal Grandfather   . Lung cancer Maternal Uncle   . Ovarian cancer Sister 56    suspected but not pathologically confirmed  . Lung cancer Paternal Uncle   . Breast cancer Maternal Aunt 69  The patient's father died at age 60, from causes not related to cancer. The patient's mother is age 60 as of April 2017. The patient has 2 brothers, 3 sisters. The patient has 1 aunt diagnosed with breast cancer in her 10s, a paternal grandfather and an uncle with throat cancer, a brother with colon cancer diagnosed in his 23s, and an uncle on the paternal side with lung cancer.   GYNECOLOGIC HISTORY:  No LMP recorded. Patient has had a hysterectomy.  Menarche age 55, first live birth age 56. She is GX P2. The patient underwent total abdominal hysterectomy with bilateral salpingo-oophorectomy for fibroids. However she tells me evaluation 2 years after that surgery showed that she still had a uterus and ovaries. She never called the surgeon ( in [place]) back to discuss this because she says he has retired.   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 At home she lives with her sister Gloria Lang and 2  grandchildren, aged 7 and 12. The patient's children are  [employer], lives in [place] and works as a Librarian, academic in a plant,  and [employer], lives in [place] and is disabled. The patient has a total of 13 grandchildren and one great-grandchild. She attends a local [employer]:  not in place    HEALTH MAINTENANCE: Social History  Substance Use Topics  . Smoking status: Never Smoker   . Smokeless tobacco: Not on file  . Alcohol Use: No     Colonoscopy: 2006?  PAP: 2014  Bone density:  Lipid panel:  No Known Allergies  Current Outpatient Prescriptions  Medication Sig Dispense Refill  . dexamethasone (DECADRON) 4 MG tablet Take 2 tablets by mouth once a day on the day after chemotherapy and then take 2 tablets two times a day for 2 days. Take with food. [place]  tablet 1  . diphenhydrAMINE (BENADRYL) 25 mg capsule Take 25 mg by mouth at bedtime.    . lidocaine-prilocaine (EMLA) cream Apply to affected area once 30 g 3  . loratadine (CLARITIN) 10 MG tablet Take 1 tablet (10 mg total) by mouth daily. 30 tablet 1  . Multiple Vitamin (MULTIVITAMIN WITH MINERALS) TABS tablet Take 1 tablet by mouth daily.    . ondansetron (ZOFRAN) 8 MG tablet Take 1 tablet (8 mg total) by mouth 2 (two) times daily as needed. Start on the third day after chemotherapy. 30 tablet 1  . prochlorperazine (COMPAZINE) 10 MG tablet Take 1 tablet (10 mg total) by mouth every 6 (six) hours as needed (Nausea or vomiting). 30 tablet 1  . zolpidem (AMBIEN) 5 MG tablet Take 1-2 tablets (5-10 mg total) by mouth at bedtime as needed for sleep. 30 tablet 0   No current facility-administered medications for this visit.    OBJECTIVE: Middle-aged African-American woman in no acute distress  Filed Vitals:   09/03/15 0952  BP: 146/86  Lang: 89  Temp: 97.9 F (36.6 C)  Resp: 18     Body mass index is 29.02 kg/(m^2).    ECOG FS:0 - Asymptomatic   Sclerae  unicteric, pupils round and equal Oropharynx clear and moist-- no thrush or other lesions No cervical or supraclavicular adenopathy Lungs no rales or rhonchi Heart regular rate and rhythm Abd soft, nontender, positive bowel sounds MSK no focal spinal tenderness, no upper extremity lymphedema Neuro: nonfocal, well oriented, appropriate affect Breasts: Deferred    LAB RESULTS:  CMP     Component Value Date/Time   NA 143 08/27/2015 1016   NA 142 08/19/2015 1417   K 4.4 08/27/2015 1016   K 4.9 08/19/2015 1417   CL 106 08/19/2015 1417   CO2 28 08/27/2015 1016   CO2 26 08/19/2015 1417   GLUCOSE 76 08/27/2015 1016   GLUCOSE 82 08/19/2015 1417   BUN 18.2 08/27/2015 1016   BUN 13 08/19/2015 1417   CREATININE 1.0 08/27/2015 1016   CREATININE 0.90 08/19/2015 1417   CALCIUM 9.5 08/27/2015 1016   CALCIUM 10.1 08/19/2015 1417   PROT 6.8 08/27/2015 1016   ALBUMIN 3.9 08/27/2015 1016   AST 19 08/27/2015 1016   ALT 13 08/27/2015 1016   ALKPHOS 47 08/27/2015 1016   BILITOT 0.31 08/27/2015 1016   GFRNONAA >60 08/19/2015 1417   GFRAA >60 08/19/2015 1417    INo results found for: SPEP, UPEP  Lab Results  Component Value Date   WBC 2.7* 09/03/2015   NEUTROABS 1.8 09/03/2015   HGB 12.6 09/03/2015   HCT 38.1 09/03/2015   MCV 92.5 09/03/2015   PLT 189 09/03/2015      Chemistry      Component Value Date/Time   NA 143 08/27/2015 1016   NA 142 08/19/2015 1417   K 4.4 08/27/2015 1016   K 4.9 08/19/2015 1417   CL 106 08/19/2015 1417   CO2 28 08/27/2015 1016   CO2 26 08/19/2015 1417   BUN 18.2 08/27/2015 1016   BUN 13 08/19/2015 1417   CREATININE 1.0 08/27/2015 1016   CREATININE 0.90 08/19/2015 1417      Component Value Date/Time   CALCIUM 9.5 08/27/2015 1016   CALCIUM 10.1 08/19/2015 1417   ALKPHOS 47 08/27/2015 1016   AST 19 08/27/2015 1016   ALT 13 08/27/2015 1016   BILITOT 0.31 08/27/2015 1016       No results found for: LABCA2  No components  found for:  [id number]  No results for input(s): INR in the last 168 hours.  Urinalysis No results found for: COLORURINE, APPEARANCEUR, LABSPEC, PHURINE, GLUCOSEU, HGBUR, BILIRUBINUR, KETONESUR, PROTEINUR, UROBILINOGEN, NITRITE, LEUKOCYTESUR    ELIGIBLE FOR AVAILABLE RESEARCH PROTOCOL: B 51, [facility]  STUDIES: Dg Chest 1 View  08/20/2015  CLINICAL DATA:  Followup pneumothorax. EXAM: FLOURO GUIDE CV LINE - NO REPORT; CHEST  1 VIEW COMPARISON:  08/20/2015 FINDINGS: The power port tip is in the distal SVC. The cardiac silhouette, mediastinal and hilar contours are normal. The lungs are clear. No pneumothorax identified. IMPRESSION: No acute cardiopulmonary findings.  No pneumothorax. Electronically Signed   By: Marijo Sanes M.D.   On: 08/20/2015 17:09   Ct Chest W Contrast  08/26/2015  CLINICAL DATA:  New diagnosis of LEFT breast cancer. Diagnosis March 2017. Hysterectomy. EXAM: CT CHEST, ABDOMEN, AND PELVIS WITH CONTRAST TECHNIQUE: Multidetector CT imaging of the chest, abdomen and pelvis was performed following the standard protocol during bolus administration of intravenous contrast. CONTRAST:  148m ISOVUE-300 IOPAMIDOL (ISOVUE-300) INJECTION 61% COMPARISON:  None. FINDINGS: CT CHEST FINDINGS Mediastinum/Nodes: The port in the LEFT chest wall. Biopsy clip in the posterior lateral aspect of the LEFT breast within a small rounded mass measuring 2 cm. Cluster rounded LEFT axillary lymph nodes in the high axilla. For example 8 mm lymph node image 17, series 2 and 8 mm lymph node on image 21 series 2. Sub pectoralis lymph node measuring 6 mm on image 12, series 2. No enlarged internal mammary lymph nodes. No supraclavicular lymph nodes identified. No mediastinal hilar lymph nodes. No pericardial fluid. No central pulmonary embolism.  Esophagus normal. Lungs/Pleura: No suspicious pulmonary nodule. Airways normal. No pleural abnormality. Musculoskeletal: No aggressive osseous lesion. CT ABDOMEN AND PELVIS FINDINGS  Hepatobiliary: No focal hepatic lesion. No biliary ductal dilatation. Gallbladder is normal. Common bile duct is normal. Pancreas: Pancreas is normal. No ductal dilatation. No pancreatic inflammation. Spleen: Normal spleen Adrenals/urinary tract: Adrenal glands and kidneys are normal. The ureters and bladder normal. Stomach/Bowel: Stomach, small bowel, appendix, and cecum are normal. The colon and rectosigmoid colon are normal. Vascular/Lymphatic: Abdominal aorta is normal caliber. There is no retroperitoneal or periportal lymphadenopathy. No pelvic lymphadenopathy. Reproductive: Post hysterectomy anatomy.  Ovaries not identified. Other: No free fluid. Musculoskeletal: No aggressive osseous lesion. IMPRESSION: Chest Impression: 1. Biopsy clip within small lateral left breast mass. 2. Several high LEFT axillary lymph nodes have suspicious morphology but are not pathologic by CT size criteria. 3. No evidence of central nodal metastasis. 4. No pulmonary metastasis. Abdomen / Pelvis Impression: 1. No evidence metastasis in soft tissues of the abdomen or pelvis. 2. No skeletal metastasis. Electronically Signed   By: SSuzy BouchardM.D.   On: 08/26/2015 13:01   Nm Bone Scan Whole Body  08/26/2015  CLINICAL DATA:  New diagnosis of left breast carcinoma EXAM: NUCLEAR MEDICINE WHOLE BODY BONE SCAN TECHNIQUE: Whole body anterior and posterior images were obtained approximately 3 hours after intravenous injection of radiopharmaceutical. RADIOPHARMACEUTICALS:  26.0 mCi Technetium-983mDP IV COMPARISON:  CT chest abdomen pelvis of 08/26/2015 FINDINGS: The patient was injected with 2628.3illicuries of technetium 9960 MDP intravenously and total body bone scan was performed. Minimal activity is noted in the lower cervical spine. Also there is faint activity in the region of L2 and L3 vertebral bodies. This activity most likely is due to degenerative change with no lytic or blastic lesion noted on bone window images from  today's CT. Mild activity is  also noted in the left femoral greater trochanter and no lytic or blastic lesion is noted at that site on today's CT scan. IMPRESSION: 1. No definite bone metastasis are noted. 2. Foci of of increased activity in the lower cervical spine, mid upper lumbar spine, and left femoral greater trochanter most likely all are degenerative in origin with no lytic or blastic lesion demonstrated on bone window images from today's CT at those sites. Electronically Signed   By: Ivar Drape M.D.   On: 08/26/2015 13:29   Ct Abdomen Pelvis W Contrast  08/26/2015  CLINICAL DATA:  New diagnosis of LEFT breast cancer. Diagnosis March 2017. Hysterectomy. EXAM: CT CHEST, ABDOMEN, AND PELVIS WITH CONTRAST TECHNIQUE: Multidetector CT imaging of the chest, abdomen and pelvis was performed following the standard protocol during bolus administration of intravenous contrast. CONTRAST:  130m ISOVUE-300 IOPAMIDOL (ISOVUE-300) INJECTION 61% COMPARISON:  None. FINDINGS: CT CHEST FINDINGS Mediastinum/Nodes: The port in the LEFT chest wall. Biopsy clip in the posterior lateral aspect of the LEFT breast within a small rounded mass measuring 2 cm. Cluster rounded LEFT axillary lymph nodes in the high axilla. For example 8 mm lymph node image 17, series 2 and 8 mm lymph node on image 21 series 2. Sub pectoralis lymph node measuring 6 mm on image 12, series 2. No enlarged internal mammary lymph nodes. No supraclavicular lymph nodes identified. No mediastinal hilar lymph nodes. No pericardial fluid. No central pulmonary embolism.  Esophagus normal. Lungs/Pleura: No suspicious pulmonary nodule. Airways normal. No pleural abnormality. Musculoskeletal: No aggressive osseous lesion. CT ABDOMEN AND PELVIS FINDINGS Hepatobiliary: No focal hepatic lesion. No biliary ductal dilatation. Gallbladder is normal. Common bile duct is normal. Pancreas: Pancreas is normal. No ductal dilatation. No pancreatic inflammation. Spleen: Normal  spleen Adrenals/urinary tract: Adrenal glands and kidneys are normal. The ureters and bladder normal. Stomach/Bowel: Stomach, small bowel, appendix, and cecum are normal. The colon and rectosigmoid colon are normal. Vascular/Lymphatic: Abdominal aorta is normal caliber. There is no retroperitoneal or periportal lymphadenopathy. No pelvic lymphadenopathy. Reproductive: Post hysterectomy anatomy.  Ovaries not identified. Other: No free fluid. Musculoskeletal: No aggressive osseous lesion. IMPRESSION: Chest Impression: 1. Biopsy clip within small lateral left breast mass. 2. Several high LEFT axillary lymph nodes have suspicious morphology but are not pathologic by CT size criteria. 3. No evidence of central nodal metastasis. 4. No pulmonary metastasis. Abdomen / Pelvis Impression: 1. No evidence metastasis in soft tissues of the abdomen or pelvis. 2. No skeletal metastasis. Electronically Signed   By: SSuzy BouchardM.D.   On: 08/26/2015 13:01   Mr Breast Bilateral W Wo Contrast  08/19/2015  CLINICAL DATA:  Biopsy proven grade 2 invasive mammary carcinoma in the 1:30 o'clock location of the left breast. Metastatic mammary carcinoma biopsied from 1 axillary lymph node. LABS:  None obtained at the time of imaging. EXAM: BILATERAL BREAST MRI WITH AND WITHOUT CONTRAST TECHNIQUE: Multiplanar, multisequence MR images of both breasts were obtained prior to and following the intravenous administration of 13 ml of MultiHance. THREE-DIMENSIONAL MR IMAGE RENDERING ON INDEPENDENT WORKSTATION: Three-dimensional MR images were rendered by post-processing of the original MR data on an independent workstation. The three-dimensional MR images were interpreted, and findings are reported in the following complete MRI report for this study. Three dimensional images were evaluated at the independent DynaCad workstation COMPARISON:  Previous exam(s). FINDINGS: Breast composition: c. Heterogeneous fibroglandular tissue. Background  parenchymal enhancement: Moderate. Right breast: No mass or abnormal enhancement. Left breast: There is a 7.6 x  4.2 x 2.3 (anterior posterior, transverse and craniocaudal dimensions) irregular enhancing mass in the middle and posterior thirds of the lateral aspect of the left breast corresponding with the biopsy proven invasive mammary carcinoma. Signal void artifact from the biopsy clip is seen in the posterior aspect of the enhancing mass. Lymph nodes: Several enlarged level 1 left axillary lymph nodes are visualized. One of them has biopsy changes and a signal void artifact from the biopsy clip. The largest lymph node measures 1.6 cm. Ancillary findings:  None. IMPRESSION: 7.6 cm enhancing irregular mass in the lateral aspect of the left breast corresponding with the biopsy proven invasive mammary carcinoma. Enlarged left axillary adenopathy corresponding with the known metastatic disease. RECOMMENDATION: Treatment planning of the known left breast invasive mammary carcinoma and metastatic axillary adenopathy is recommended. The surgical clip is located along the posterior aspect of the carcinoma. If pathologically proving the extent of disease is clinically indicated the anterior aspect of the enhancing mass could be biopsied with MR guidance. BI-RADS CATEGORY  6: Known biopsy-proven malignancy. Electronically Signed   By: Lillia Mountain M.D.   On: 08/19/2015 11:34   Dg Chest Port 1 View  08/20/2015  CLINICAL DATA:  Left Port-A-Cath insertion EXAM: PORTABLE CHEST 1 VIEW COMPARISON:  None. FINDINGS: Left subclavian Port-A-Cath terminates at the cavoatrial junction. Normal heart size. Normal mediastinal contour. No right pneumothorax. Questionable asymmetric lucency at the left lung apex, cannot exclude a tiny left apical pneumothorax. No pleural effusion. No pulmonary edema. Mild patchy opacity at the lateral left lung base. IMPRESSION: 1. Questionable asymmetric lucency at the left lung apex, cannot exclude a  tiny left apical pneumothorax. Short-term follow-up chest radiograph advised. 2. Well-positioned left subclavian Port-A-Cath. 3. Mild patchy left lung base opacity, favor atelectasis. These results were called by telephone at the time of interpretation on 08/20/2015 at 1:18 pm to RN [facility], who verbally acknowledged these results. Electronically Signed   By: Ilona Sorrel M.D.   On: 08/20/2015 13:19   Mm Digital Diagnostic Unilat L  08/05/2015  CLINICAL DATA:  55 year old female status post ultrasound-guided biopsies of a left breast mass at 130 and of an enlarged left axillary lymph node. EXAM: DIAGNOSTIC LEFT MAMMOGRAM POST ULTRASOUND BIOPSY COMPARISON:  Previous exam(s). FINDINGS: Mammographic images were obtained following ultrasound guided biopsies of a left breast mass at 130 and of an enlarged left axillary lymph node. Post biopsy mammogram demonstrates the ribbon shaped biopsy marker to be within the left breast mass at 130 and the spiral shaped biopsy marker to be in the expected position of an enlarged left axillary lymph node. IMPRESSION: Appropriate marker position as above. Final Assessment: Post Procedure Mammograms for Marker Placement Electronically Signed   By: Pamelia Hoit M.D.   On: 08/05/2015 16:31   Dg Fluoro Guide Cv Line-no Report  08/20/2015  CLINICAL DATA:  FLOURO GUIDE CV LINE Fluoroscopy was utilized by the requesting physician.  No radiographic interpretation.   [name] Lt Breast Bx W Loc Dev 1st Lesion Img Bx Spec US Guide  08/06/2015  ADDENDUM REPORT: 08/06/2015 14:38 ADDENDUM: Pathology revealed GRADE II INVASIVE MAMMARY CARCINOMA of the Left breast at the 1:30 o'clock location. ONE LYMPH NODE POSITIVE FOR METASTATIC MAMMARY CARCINOMA of the Left axilla. This was found to be concordant by Dr. Pamelia Hoit. Pathology results were discussed with the patient by telephone. The patient reported doing well after the biopsy with tenderness at the site. Post biopsy instructions and care were  reviewed and questions were  answered. The patient was encouraged to call [facility] for any additional concerns. The patient was referred to [facility] at [facility] on August 11, 2015. Pathology results reported by Terie Purser, RN on 08/06/2015. Electronically Signed   By: Pamelia Hoit M.D.   On: 08/06/2015 14:38  08/06/2015  CLINICAL DATA:  55 year old female for ultrasound-guided biopsy of an enlarged left axillary lymph node EXAM: ULTRASOUND GUIDED CORE NEEDLE BIOPSY OF A LEFT AXILLARY NODE COMPARISON:  Previous exam(s). FINDINGS: I met with the patient and we discussed the procedure of ultrasound-guided biopsy, including benefits and alternatives. We discussed the high likelihood of a successful procedure. We discussed the risks of the procedure, including infection, bleeding, tissue injury, clip migration, and inadequate sampling. Informed written consent was given. The usual time-out protocol was performed immediately prior to the procedure. Using sterile technique and 1% Lidocaine as local anesthetic, under direct ultrasound visualization, a 14 gauge spring-loaded device was used to perform biopsy of an enlarged left axillary lymph node using a lateral to medial approach. At the conclusion of the procedure a spiral shaped tissue marker clip was deployed into the biopsy cavity. Follow up 2 view mammogram was performed and dictated separately. IMPRESSION: Ultrasound guided biopsy of an enlarged left axillary lymph node. No apparent complications. Electronically Signed: By: Pamelia Hoit M.D. On: 08/05/2015 16:30   [name] Lt Breast Bx W Loc Dev Ea Add Lesion Img Bx Spec US Guide  08/06/2015  ADDENDUM REPORT: 08/06/2015 14:38 ADDENDUM: Pathology revealed GRADE II INVASIVE MAMMARY CARCINOMA of the Left breast at the 1:30 o'clock location. ONE LYMPH NODE POSITIVE FOR METASTATIC MAMMARY CARCINOMA of the Left axilla. This was found  to be concordant by Dr. Pamelia Hoit. Pathology results were discussed with the patient by telephone. The patient reported doing well after the biopsy with tenderness at the site. Post biopsy instructions and care were reviewed and questions were answered. The patient was encouraged to call [facility] for any additional concerns. The patient was referred to [facility] at [facility] on August 11, 2015. Pathology results reported by Terie Purser, RN on 08/06/2015. Electronically Signed   By: Pamelia Hoit M.D.   On: 08/06/2015 14:38  08/06/2015  CLINICAL DATA:  55 year old female for ultrasound-guided biopsy of a left breast mass at 130 EXAM: ULTRASOUND GUIDED LEFT BREAST CORE NEEDLE BIOPSY COMPARISON:  Previous exam(s). FINDINGS: I met with the patient and we discussed the procedure of ultrasound-guided biopsy, including benefits and alternatives. We discussed the high likelihood of a successful procedure. We discussed the risks of the procedure, including infection, bleeding, tissue injury, clip migration, and inadequate sampling. Informed written consent was given. The usual time-out protocol was performed immediately prior to the procedure. Using sterile technique and 1% Lidocaine as local anesthetic, under direct ultrasound visualization, a 12 gauge spring-loaded device was used to perform biopsy of a left breast mass at 130 using a lateral to medial approach. At the conclusion of the procedure a ribbon shaped tissue marker clip was deployed into the biopsy cavity. Follow up 2 view mammogram was performed and dictated separately. IMPRESSION: Ultrasound guided biopsy of a left breast mass at 130. No apparent complications. Electronically Signed: By: Pamelia Hoit M.D. On: 08/05/2015 16:30    ASSESSMENT: 55 y.o. [place] woman status post left breast upper outer quadrant and left axillary lymph node biopsy 08/05/2015, both  positive for an invasive ductal carcinoma, grade 2 or 3, triple negative, with an MIB-1 between 10 and 60%   (1) neoadjuvant chemotherapy consisting of doxorubicin and cyclophosphamide in dose dense fashion 4 started 08/27/2015, to be followed by weekly paclitaxel and carboplatin 12  (2) definitive surgery we will follow chemotherapy  (3) adjuvant radiation to follow as appropriate  (4) genetics testing scheduled for 09/17/2015  PLAN: [place] . The only issue was constipation and she is managing that well. She has also had a little bit of swallowing difficulty which for some reason she feels improves with prochlorperazine. She of course can take that on a when necessary basis.  She couldn't sleep for 2 days, doubtless because of the steroids. She does not like lorazepam because it makes her feel like a zombie. She has had Ambien before and tolerates that well. I went ahead and wrote her a prescription for that.  There was some confusion in her schedule. She was given the wrong time for today's visit and she thought that she had a treatment today. We clarified all lab and I wrote her the treatment dates so she actually has them on a piece of paper that she can see. Her next treatment of course his next week and she has an appointment on that day as well.  I wrote her a prescription for weights and directed her to our [employer] where she may be loaned a couple of weeks as she is going to be losing her hair within the next week to 10 days  Otherwise I am delighted that things are going so well. He knows to call for any problems that may develop before her next visit here  Gloria Cruel, MD   09/03/2015 10:33 AM

## 2015-09-07 ENCOUNTER — Other Ambulatory Visit: Payer: Self-pay

## 2015-09-07 ENCOUNTER — Telehealth: Payer: Self-pay

## 2015-09-07 ENCOUNTER — Other Ambulatory Visit: Payer: Self-pay | Admitting: *Deleted

## 2015-09-07 ENCOUNTER — Ambulatory Visit (HOSPITAL_BASED_OUTPATIENT_CLINIC_OR_DEPARTMENT_OTHER): Payer: BLUE CROSS/BLUE SHIELD | Admitting: Nurse Practitioner

## 2015-09-07 ENCOUNTER — Other Ambulatory Visit (HOSPITAL_BASED_OUTPATIENT_CLINIC_OR_DEPARTMENT_OTHER): Payer: BLUE CROSS/BLUE SHIELD

## 2015-09-07 VITALS — BP 138/97 | HR 99 | Temp 98.3°F | Resp 20 | Ht 60.0 in | Wt 149.3 lb

## 2015-09-07 DIAGNOSIS — C50412 Malignant neoplasm of upper-outer quadrant of left female breast: Secondary | ICD-10-CM | POA: Diagnosis not present

## 2015-09-07 DIAGNOSIS — J029 Acute pharyngitis, unspecified: Secondary | ICD-10-CM

## 2015-09-07 LAB — COMPREHENSIVE METABOLIC PANEL
ALT: 22 U/L (ref 0–55)
AST: 25 U/L (ref 5–34)
Albumin: 3.9 g/dL (ref 3.5–5.0)
Alkaline Phosphatase: 140 U/L (ref 40–150)
Anion Gap: 9 mEq/L (ref 3–11)
BUN: 15.8 mg/dL (ref 7.0–26.0)
CO2: 27 mEq/L (ref 22–29)
CREATININE: 1 mg/dL (ref 0.6–1.1)
Calcium: 9.5 mg/dL (ref 8.4–10.4)
Chloride: 100 mEq/L (ref 98–109)
EGFR: 72 mL/min/{1.73_m2} — ABNORMAL LOW (ref >90)
GLUCOSE: 120 mg/dL (ref 70–140)
Potassium: 4.3 mEq/L (ref 3.5–5.1)
SODIUM: 137 meq/L (ref 136–145)
TOTAL PROTEIN: 6.6 g/dL (ref 6.4–8.3)

## 2015-09-07 LAB — CBC WITH DIFFERENTIAL/PLATELET
BASO%: 1.2 % (ref 0.0–2.0)
Basophils Absolute: 0.5 10*3/uL — ABNORMAL HIGH (ref 0.0–0.1)
EOS%: 0 % (ref 0.0–7.0)
Eosinophils Absolute: 0 10*3/uL (ref 0.0–0.5)
HCT: 35.8 % (ref 34.8–46.6)
HGB: 12 g/dL (ref 11.6–15.9)
LYMPH%: 6.6 % — ABNORMAL LOW (ref 14.0–49.7)
MCH: 31.2 pg (ref 25.1–34.0)
MCHC: 33.5 g/dL (ref 31.5–36.0)
MCV: 93 fL (ref 79.5–101.0)
MONO#: 2.5 10*3/uL — ABNORMAL HIGH (ref 0.1–0.9)
MONO%: 5.9 % (ref 0.0–14.0)
NEUT%: 86.3 % — ABNORMAL HIGH (ref 38.4–76.8)
NEUTROS ABS: 36.6 10*3/uL — AB (ref 1.5–6.5)
NRBC: 2 % — AB (ref 0–0)
Platelets: 176 10*3/uL (ref 145–400)
RBC: 3.85 10*6/uL (ref 3.70–5.45)
RDW: 13 % (ref 11.2–14.5)
WBC: 42.5 10*3/uL — AB (ref 3.9–10.3)
lymph#: 2.8 10*3/uL (ref 0.9–3.3)

## 2015-09-07 NOTE — Telephone Encounter (Signed)
Patient was on the triage urgent pager this morning.  Writer called patient who states that her throat is so sore she can not swallow.  Patient found 3 antibiotic tabs left over from another time and took those over the weekend- she believes it has mad her throat feel a bit better.  Patient is requesting for more antibiotics.  Per Dr. Jana Hakim patient needs to be evaluated by [facility] in [facility] because this could be mucositis or something that needs to be treated differently. Patient agreed to come in the [facility] today.

## 2015-09-08 ENCOUNTER — Encounter: Payer: Self-pay | Admitting: Nurse Practitioner

## 2015-09-08 ENCOUNTER — Telehealth: Payer: Self-pay

## 2015-09-08 DIAGNOSIS — J029 Acute pharyngitis, unspecified: Secondary | ICD-10-CM | POA: Insufficient documentation

## 2015-09-08 MED ORDER — AMOXICILLIN-POT CLAVULANATE 875-125 MG PO TABS: 1.0000 | ORAL_TABLET | Freq: Two times a day (BID) | ORAL | Status: DC

## 2015-09-08 NOTE — Progress Notes (Signed)
SYMPTOM MANAGEMENT CLINIC    Chief Complaint: Sore throat  HPI:  Gloria Lang 55 y.o. female diagnosed with breast cancer.  Currently undergoing dose dense AC chemotherapy regimen.  Patient reports onset of sore throat over the weekend.  She states that she took some left over amoxicillin that she R Reece Levy had 2 doses; and felt much better.  She denies any recent fevers or chills.   No history exists.    Review of Systems  HENT: Positive for sore throat.   All other systems reviewed and are negative.   Past Medical History  Diagnosis Date  . Breast cancer of upper-outer quadrant of left female breast ([place]) 08/07/2015  . Anemia   . Hot flashes   . Family history of adverse reaction to anesthesia     younger sister has same issues with anesthesia  . Complication of anesthesia     patient states she has coded multiple times before with anesthesia. her sister has similar issues.   . Shortness of breath dyspnea     if patient's head is flat (per pt)  . Asthma     only in the summer time- uses albuterol inhaler  . GERD (gastroesophageal reflux disease)   . History of hiatal hernia     had hiatal hernia repair in '94 (per patient)  . Arthritis   . Hx of peptic ulcer   . Hx of transfusion of packed red blood cells   . Sleep apnea     couldn't tolerate CPAP d/t claustrophobia; sleep upright "90 degree angle": study done at [facility] several years ago    Past Surgical History  Procedure Laterality Date  . Abdominal hysterectomy    . Tubal ligation    . Hernia repair    . Colonoscopy    . Upper gi endoscopy    . Portacath placement N/A 08/20/2015    Procedure: INSERTION PORT-A-CATH;  Surgeon: Stark Klein, MD;  Location: [place];  Service: General;  Laterality: N/A;    has Breast cancer of upper-outer quadrant of left female breast ([place]) and Pharyngitis on her problem list.    has No Known Allergies.    Medication List       This list is accurate as of: 09/07/15  11:59 PM.  Always use your most recent med list.               amoxicillin-clavulanate 875-125 MG tablet  Commonly known as:  AUGMENTIN  Take 1 tablet by mouth 2 (two) times daily.     dexamethasone 4 MG tablet  Commonly known as:  DECADRON  Take 2 tablets by mouth once a day on the day after chemotherapy and then take 2 tablets two times a day for 2 days. Take with food.     diphenhydrAMINE 25 mg capsule  Commonly known as:  BENADRYL  Take 25 mg by mouth at bedtime.     lidocaine-prilocaine cream  Commonly known as:  EMLA  Apply to affected area once     loratadine 10 MG tablet  Commonly known as:  CLARITIN  Take 1 tablet (10 mg total) by mouth daily.     multivitamin with minerals Tabs tablet  Take 1 tablet by mouth daily.     ondansetron 8 MG tablet  Commonly known as:  ZOFRAN  Take 1 tablet (8 mg total) by mouth 2 (two) times daily as needed. Start on the third day after chemotherapy.     prochlorperazine 10 MG tablet  Commonly known as:  COMPAZINE  Take 1 tablet (10 mg total) by mouth every 6 (six) hours as needed (Nausea or vomiting).     zolpidem 5 MG tablet  Commonly known as:  AMBIEN  Take 1-2 tablets (5-10 mg total) by mouth at bedtime as needed for sleep.         PHYSICAL EXAMINATION  Oncology Vitals 09/07/2015 09/03/2015  Height 152 cm 152 cm  Weight 67.722 kg 67.405 kg  Weight (lbs) 149 lbs 5 oz 148 lbs 10 oz  BMI (kg/m2) 29.16 kg/m2 29.02 kg/m2  Temp 98.3 97.9  Pulse 99 89  Resp 20 18  SpO2 100 100  BSA (m2) 1.69 m2 1.69 m2   BP Readings from Last 2 Encounters:  09/07/15 138/97  09/03/15 146/86    Physical Exam  Constitutional: She is oriented to person, place, and time and well-developed, well-nourished, and in no distress.  HENT:  Head: Normocephalic and atraumatic.  Mouth/Throat: Oropharynx is clear and moist.  Eyes: Conjunctivae and EOM are normal. Pupils are equal, round, and reactive to light. Right eye exhibits no discharge. Left  eye exhibits no discharge. No scleral icterus.  Neck: Normal range of motion. Neck supple. No JVD present. No tracheal deviation present. No thyromegaly present.  Cardiovascular: Normal rate, regular rhythm, normal heart sounds and intact distal pulses.   Pulmonary/Chest: Effort normal and breath sounds normal. No respiratory distress. She has no wheezes. She has no rales. She exhibits no tenderness.  Abdominal: Soft. Bowel sounds are normal. She exhibits no distension and no mass. There is no tenderness. There is no rebound and no guarding.  Musculoskeletal: Normal range of motion. She exhibits no edema or tenderness.  Lymphadenopathy:    She has no cervical adenopathy.  Neurological: She is alert and oriented to person, place, and time. Gait normal.  Skin: Skin is warm and dry. No rash noted. No erythema. No pallor.  Psychiatric: Affect normal.  Nursing note and vitals reviewed.   LABORATORY DATA:. Orders Only on 09/07/2015  Component Date Value Ref Range Status  . Strep Gp A Ag, IA W/Reflex 09/07/2015 Negative  Negative Preliminary  Appointment on 09/07/2015  Component Date Value Ref Range Status  . WBC 09/07/2015 42.5* 3.9 - 10.3 10e3/uL Final  . NEUT# 09/07/2015 36.6* 1.5 - 6.5 10e3/uL Final  . HGB 09/07/2015 12.0  11.6 - 15.9 g/dL Final  . HCT 09/07/2015 35.8  34.8 - 46.6 % Final  . Platelets 09/07/2015 176  145 - 400 10e3/uL Final  . MCV 09/07/2015 93.0  79.5 - 101.0 fL Final  . MCH 09/07/2015 31.2  25.1 - 34.0 pg Final  . MCHC 09/07/2015 33.5  31.5 - 36.0 g/dL Final  . RBC 09/07/2015 3.85  3.70 - 5.45 10e6/uL Final  . RDW 09/07/2015 13.0  11.2 - 14.5 % Final  . lymph# 09/07/2015 2.8  0.9 - 3.3 10e3/uL Final  . MONO# 09/07/2015 2.5* 0.1 - 0.9 10e3/uL Final  . Eosinophils Absolute 09/07/2015 0.0  0.0 - 0.5 10e3/uL Final  . Basophils Absolute 09/07/2015 0.5* 0.0 - 0.1 10e3/uL Final  . NEUT% 09/07/2015 86.3* 38.4 - 76.8 % Final  . LYMPH% 09/07/2015 6.6* 14.0 - 49.7 % Final  .  MONO% 09/07/2015 5.9  0.0 - 14.0 % Final  . EOS% 09/07/2015 0.0  0.0 - 7.0 % Final  . BASO% 09/07/2015 1.2  0.0 - 2.0 % Final  . nRBC 09/07/2015 2* 0 - 0 % Final  . Sodium 09/07/2015 137  136 - 145 mEq/L Final  . Potassium 09/07/2015 4.3  3.5 - 5.1 mEq/L Final  . Chloride 09/07/2015 100  98 - 109 mEq/L Final  . CO2 09/07/2015 27  22 - 29 mEq/L Final  . Glucose 09/07/2015 120  70 - 140 mg/dl Final   Glucose reference range is for nonfasting patients. Fasting glucose reference range is 70- 100.  Marland Kitchen BUN 09/07/2015 15.8  7.0 - 26.0 mg/dL Final  . Creatinine 09/07/2015 1.0  0.6 - 1.1 mg/dL Final  . Total Bilirubin 09/07/2015 <0.30  0.20 - 1.20 mg/dL Final  . Alkaline Phosphatase 09/07/2015 140  40 - 150 U/L Final  . AST 09/07/2015 25  5 - 34 U/L Final  . ALT 09/07/2015 22  0 - 55 U/L Final  . Total Protein 09/07/2015 6.6  6.4 - 8.3 g/dL Final  . Albumin 09/07/2015 3.9  3.5 - 5.0 g/dL Final  . Calcium 09/07/2015 9.5  8.4 - 10.4 mg/dL Final  . Anion Gap 09/07/2015 9  3 - 11 mEq/L Final  . EGFR 09/07/2015 72* >90 ml/min/1.73 m2 Final   eGFR is calculated using the CKD-EPI Creatinine Equation (2009)    RADIOGRAPHIC STUDIES: No results found.  ASSESSMENT/PLAN:    Pharyngitis Patient reports onset of sore throat over the weekend.  She states that she took some left over amoxicillin that she R Reece Levy had 2 doses; and felt much better.  She denies any recent fevers or chills.  Exam today reveals no nasal congestion and lungs clear.  Posterior oropharynx essentially clear with no erythema or exudate.  Vital signs were stable and patient was afebrile.  Strep test obtained today was negative.  Will prescribe Augmentin for acute pharyngitis.  Advised patient that the pharyngitis could actually be a viral infection; so therefore no antibiotics would help.  Patient was advised to call/return of electrical to the emergency department for any worsening symptoms whatsoever.  Breast cancer of  upper-outer quadrant of left female breast [facility]) Patient underwent cycle one of her dose dense AC chemotherapy on 08/27/2015.  She received Neulasta for growth factor support.  Blood counts obtained today reveal a leukocytosis with WBC 42.5 and ANC 36.6.  Patient remains afebrile.  Patient was scheduled to return on 09/10/2015 for labs and a follow-up visit; but will cancel these appointments.  Since patient was seen today.  Patient is scheduled to return for labs only on 09/17/2015.  She is scheduled for a follow-up visit on 09/24/2015.   Patient stated understanding of all instructions; and was in agreement with this plan of care. The patient knows to call the clinic with any problems, questions or concerns.   Total time spent with patient was 25 minutes;  with greater than 75 percent of that time spent in face to face counseling regarding patient's symptoms,  and coordination of care and follow up.  Disclaimer:This dictation was prepared with Dragon/digital dictation along with [employer]. Any transcriptional errors that result from this process are unintentional.  Drue Second, NP 09/08/2015

## 2015-09-08 NOTE — Assessment & Plan Note (Signed)
Patient underwent cycle one of her dose dense AC chemotherapy on 08/27/2015.  She received Neulasta for growth factor support.  Blood counts obtained today reveal a leukocytosis with WBC 42.5 and ANC 36.6.  Patient remains afebrile.  Patient was scheduled to return on 09/10/2015 for labs and a follow-up visit; but will cancel these appointments.  Since patient was seen today.  Patient is scheduled to return for labs only on 09/17/2015.  She is scheduled for a follow-up visit on 09/24/2015.

## 2015-09-08 NOTE — Assessment & Plan Note (Signed)
Patient reports onset of sore throat over the weekend.  She states that she took some left over amoxicillin that she R Reece Levy had 2 doses; and felt much better.  She denies any recent fevers or chills.  Exam today reveals no nasal congestion and lungs clear.  Posterior oropharynx essentially clear with no erythema or exudate.  Vital signs were stable and patient was afebrile.  Strep test obtained today was negative.  Will prescribe Augmentin for acute pharyngitis.  Advised patient that the pharyngitis could actually be a viral infection; so therefore no antibiotics would help.  Patient was advised to call/return of electrical to the emergency department for any worsening symptoms whatsoever.

## 2015-09-08 NOTE — Telephone Encounter (Signed)
Gloria Lang called stating her "throat is still hurting and she needs to be able to eat". She is asking if [name] has Rx an antibiotic.

## 2015-09-08 NOTE — Telephone Encounter (Signed)
Rtn call to pt to advise abx was called into her pharmacy. Pt mailbox is full and unable to leave message.

## 2015-09-09 LAB — RAPID STREP SCREEN (MED CTR MEBANE ONLY)
Strep A Culture: NEGATIVE
Strep Gp A Ag, IA W/Reflex: NEGATIVE

## 2015-09-10 ENCOUNTER — Encounter: Payer: Self-pay | Admitting: Nurse Practitioner

## 2015-09-10 ENCOUNTER — Ambulatory Visit: Payer: BLUE CROSS/BLUE SHIELD

## 2015-09-10 ENCOUNTER — Other Ambulatory Visit: Payer: Self-pay | Admitting: Nurse Practitioner

## 2015-09-10 ENCOUNTER — Ambulatory Visit: Payer: BLUE CROSS/BLUE SHIELD | Admitting: Nurse Practitioner

## 2015-09-10 ENCOUNTER — Ambulatory Visit (HOSPITAL_BASED_OUTPATIENT_CLINIC_OR_DEPARTMENT_OTHER): Payer: BLUE CROSS/BLUE SHIELD | Admitting: Nurse Practitioner

## 2015-09-10 ENCOUNTER — Other Ambulatory Visit (HOSPITAL_BASED_OUTPATIENT_CLINIC_OR_DEPARTMENT_OTHER): Payer: BLUE CROSS/BLUE SHIELD

## 2015-09-10 ENCOUNTER — Other Ambulatory Visit: Payer: BLUE CROSS/BLUE SHIELD

## 2015-09-10 ENCOUNTER — Ambulatory Visit (HOSPITAL_BASED_OUTPATIENT_CLINIC_OR_DEPARTMENT_OTHER): Payer: BLUE CROSS/BLUE SHIELD

## 2015-09-10 VITALS — BP 154/98 | HR 90 | Temp 98.0°F | Resp 18

## 2015-09-10 DIAGNOSIS — Z171 Estrogen receptor negative status [ER-]: Secondary | ICD-10-CM | POA: Diagnosis not present

## 2015-09-10 DIAGNOSIS — C50412 Malignant neoplasm of upper-outer quadrant of left female breast: Secondary | ICD-10-CM

## 2015-09-10 DIAGNOSIS — C773 Secondary and unspecified malignant neoplasm of axilla and upper limb lymph nodes: Secondary | ICD-10-CM

## 2015-09-10 DIAGNOSIS — D72829 Elevated white blood cell count, unspecified: Secondary | ICD-10-CM | POA: Diagnosis not present

## 2015-09-10 DIAGNOSIS — Z5111 Encounter for antineoplastic chemotherapy: Secondary | ICD-10-CM

## 2015-09-10 DIAGNOSIS — K1370 Unspecified lesions of oral mucosa: Secondary | ICD-10-CM

## 2015-09-10 DIAGNOSIS — K1231 Oral mucositis (ulcerative) due to antineoplastic therapy: Secondary | ICD-10-CM

## 2015-09-10 LAB — COMPREHENSIVE METABOLIC PANEL
ALBUMIN: 4.1 g/dL (ref 3.5–5.0)
ALK PHOS: 125 U/L (ref 40–150)
ALT: 28 U/L (ref 0–55)
AST: 24 U/L (ref 5–34)
Anion Gap: 9 mEq/L (ref 3–11)
BUN: 12.3 mg/dL (ref 7.0–26.0)
CALCIUM: 9.8 mg/dL (ref 8.4–10.4)
CHLORIDE: 101 meq/L (ref 98–109)
CO2: 27 mEq/L (ref 22–29)
Creatinine: 0.9 mg/dL (ref 0.6–1.1)
EGFR: 90 mL/min/{1.73_m2} — AB (ref >90)
GLUCOSE: 127 mg/dL (ref 70–140)
POTASSIUM: 4.4 meq/L (ref 3.5–5.1)
SODIUM: 137 meq/L (ref 136–145)
Total Bilirubin: <0.3 mg/dL (ref 0.20–1.20)
Total Protein: 6.9 g/dL (ref 6.4–8.3)

## 2015-09-10 LAB — CBC WITH DIFFERENTIAL/PLATELET
BASO%: 0.3 % (ref 0.0–2.0)
BASOS ABS: 0.1 10*3/uL (ref 0.0–0.1)
EOS ABS: 0 10*3/uL (ref 0.0–0.5)
EOS%: 0 % (ref 0.0–7.0)
HCT: 37.6 % (ref 34.8–46.6)
HEMOGLOBIN: 12.4 g/dL (ref 11.6–15.9)
LYMPH%: 6.9 % — ABNORMAL LOW (ref 14.0–49.7)
MCH: 30.5 pg (ref 25.1–34.0)
MCHC: 33.1 g/dL (ref 31.5–36.0)
MCV: 92.3 fL (ref 79.5–101.0)
MONO#: 1.3 10*3/uL — ABNORMAL HIGH (ref 0.1–0.9)
MONO%: 3.2 % (ref 0.0–14.0)
NEUT#: 35.6 10*3/uL — ABNORMAL HIGH (ref 1.5–6.5)
NEUT%: 89.6 % — AB (ref 38.4–76.8)
Platelets: 217 10*3/uL (ref 145–400)
RBC: 4.07 10*6/uL (ref 3.70–5.45)
RDW: 13.1 % (ref 11.2–14.5)
WBC: 39.8 10*3/uL — ABNORMAL HIGH (ref 3.9–10.3)
lymph#: 2.8 10*3/uL (ref 0.9–3.3)

## 2015-09-10 LAB — TECHNOLOGIST REVIEW

## 2015-09-10 MED ORDER — HEPARIN SOD (PORK) LOCK FLUSH 100 UNIT/ML IV SOLN
500.0000 [IU] | Freq: Once | INTRAVENOUS | Status: AC | PRN
  Administered 2015-09-10: 500 [IU]
  Filled 2015-09-10: qty 5

## 2015-09-10 MED ORDER — PALONOSETRON HCL INJECTION 0.25 MG/5ML
0.2500 mg | Freq: Once | INTRAVENOUS | Status: AC
  Administered 2015-09-10: 0.25 mg via INTRAVENOUS

## 2015-09-10 MED ORDER — PALONOSETRON HCL INJECTION 0.25 MG/5ML
INTRAVENOUS | Status: AC
  Filled 2015-09-10: qty 5

## 2015-09-10 MED ORDER — SODIUM CHLORIDE 0.9% FLUSH
10.0000 mL | INTRAVENOUS | Status: DC | PRN
  Administered 2015-09-10: 10 mL
  Filled 2015-09-10: qty 10

## 2015-09-10 MED ORDER — SODIUM CHLORIDE 0.9 % IV SOLN
600.0000 mg/m2 | Freq: Once | INTRAVENOUS | Status: AC
  Administered 2015-09-10: 1000 mg via INTRAVENOUS
  Filled 2015-09-10: qty 50

## 2015-09-10 MED ORDER — SODIUM CHLORIDE 0.9 % IV SOLN
Freq: Once | INTRAVENOUS | Status: AC
  Administered 2015-09-10: 11:00:00 via INTRAVENOUS

## 2015-09-10 MED ORDER — SODIUM CHLORIDE 0.9 % IV SOLN
Freq: Once | INTRAVENOUS | Status: AC
  Administered 2015-09-10: 11:00:00 via INTRAVENOUS
  Filled 2015-09-10: qty 5

## 2015-09-10 MED ORDER — DOXORUBICIN HCL CHEMO IV INJECTION 2 MG/ML
60.0000 mg/m2 | Freq: Once | INTRAVENOUS | Status: AC
  Administered 2015-09-10: 100 mg via INTRAVENOUS
  Filled 2015-09-10: qty 50

## 2015-09-10 MED ORDER — PEGFILGRASTIM 6 MG/0.6ML ~~LOC~~ PSKT
6.0000 mg | PREFILLED_SYRINGE | Freq: Once | SUBCUTANEOUS | Status: AC
  Administered 2015-09-10: 6 mg via SUBCUTANEOUS
  Filled 2015-09-10: qty 0.6

## 2015-09-10 MED ORDER — VALACYCLOVIR HCL 500 MG PO TABS: 500.0000 mg | ORAL_TABLET | Freq: Every day | ORAL | Status: AC

## 2015-09-10 NOTE — Progress Notes (Signed)
OK to treat and give on-pro today despite Neut-35.6 per H. Boelter NP per Dr. Jana Hakim.  Pt will be out of town next week and will not be available for nadir check per MD.

## 2015-09-10 NOTE — Progress Notes (Signed)
[place]  [phone] Fax:([phone]) [phone]     ID: Gloria Lang DOB: 09-26-1960  MR#: [id number]  XVQ#:[id number]  Patient Care Team: Everardo Beals, NP as PCP - General Chauncey Cruel, MD as Consulting Physician (Oncology) Kyung Rudd, MD as Consulting Physician (Radiation Oncology) Stark Klein, MD as Consulting Physician (General Surgery) PCP: Imelda Pillow, NP GYN: OTHER MD:  CHIEF COMPLAINT: Triple negative breast cancer  CURRENT TREATMENT: Neoadjuvant chemotherapy  BREAST CANCER HISTORY: From the original intake note:  Gloria Lang started experiencing pain in her left breast about a month prior to bringing it to medical attention. The pain would, go but overall it tended to be getting worse. There was no obvious mass or discharge. She finally brought it to [employer] attention in Dr. Ellie Lunch Lang and he was able to palpate a mass. He set the patient up for bilateral diagnostic mammography with tomography and left breast ultrasonography at the [facility] 07/30/2015. The breast density was category D. In the upper-outer quadrant of the left breast there was a spiculated mass which was palpable at the 1:30 o'clock location 10 cm from the nipple. There was no palpable axillary adenopathy. Ultrasound confirmed an irregular hypoechoic mass in the area just mentioned measuring 2.4 cm. The left axilla showed 2 adjacent lymph nodes with cortical thickening.  On 08/05/2015 [name] underwent biopsy of the left breast mass and also of one of the left axillary lymph nodes in question. The final pathology ([facility] [id number]) showed both biopsies to be involved by invasive mammary carcinoma, which proved to be E-cadherin positive and therefore consistent with a ductal phenotype. Prognostic panels were run on both biopsies. Both were estrogen and progesterone receptor negative. The MIB-1 of the breast mass was 10% but that of the lymph node mass was 60%. HER-2 was  negative and both cases, with a signals ratio is between 1.63 and 1.71, and number per cell between 1.95 and 2.05.  Her subsequent history is as detailed below  INTERVAL HISTORY: Gloria Lang returns for follow up of her left-sided breast cancer, accompanied by her significant other Gloria Lang. Today is day 1, cycle 2 of 4 planned cycles of doxorubicin and cyclophosphamide, with neulasta onpro given on day 2 for granulocyte support.   REVIEW OF SYSTEMS: Caelyn visited our symptom management clinic on Monday with complaints of a sore throat. Strep was ruled out and she was sent home with a course of antibiotics. She has completed these and she feels much improved. She has a singular sore to the corner of her left mouth, but otherwise has a clear oral cavity. She denies fevers, chills, nausea, or vomiting. She was constipated, but has been moving her bowels well since taking 1/2 bottle of magnesium citrate last week. She felt jittery and hyper last week, but his has resolved. Her ability to sleep at night is off. A detailed review of systems is otherwise stable.  PAST MEDICAL HISTORY: Past Medical History  Diagnosis Date  . Breast cancer of upper-outer quadrant of left female breast ([place]) 08/07/2015  . Anemia   . Hot flashes   . Family history of adverse reaction to anesthesia     younger sister has same issues with anesthesia  . Complication of anesthesia     patient states she has coded multiple times before with anesthesia. her sister has similar issues.   . Shortness of breath dyspnea     if patient's head is flat (per pt)  . Asthma  only in the summer time- uses albuterol inhaler  . GERD (gastroesophageal reflux disease)   . History of hiatal hernia     had hiatal hernia repair in '94 (per patient)  . Arthritis   . Hx of peptic ulcer   . Hx of transfusion of packed red blood cells   . Sleep apnea     couldn't tolerate CPAP d/t claustrophobia; sleep upright "90 degree angle": study  done at [facility] several years ago    PAST SURGICAL HISTORY: Past Surgical History  Procedure Laterality Date  . Abdominal hysterectomy    . Tubal ligation    . Hernia repair    . Colonoscopy    . Upper gi endoscopy    . Portacath placement N/A 08/20/2015    Procedure: INSERTION PORT-A-CATH;  Surgeon: Stark Klein, MD;  Location: [facility] OR;  Service: General;  Laterality: N/A;    FAMILY HISTORY Family History  Problem Relation Age of Onset  . Colon cancer Brother 1  . Throat cancer Maternal Grandfather   . Lung cancer Maternal Uncle   . Ovarian cancer Sister 34    suspected but not pathologically confirmed  . Lung cancer Paternal Uncle   . Breast cancer Maternal Aunt 66  The patient's father died at age 28, from causes not related to cancer. The patient's mother is age 67 as of April 2017. The patient has 2 brothers, 3 sisters. The patient has 1 aunt diagnosed with breast cancer in her 61s, a paternal grandfather and an uncle with throat cancer, a brother with colon cancer diagnosed in his 65s, and an uncle on the paternal side with lung cancer.   GYNECOLOGIC HISTORY:  No LMP recorded. Patient has had a hysterectomy.  Menarche age 57, first live birth age 55. She is GX P2. The patient underwent total abdominal hysterectomy with bilateral salpingo-oophorectomy for fibroids. However she tells me evaluation 2 years after that surgery showed that she still had a uterus and ovaries. She never called the surgeon ( in [place]) back to discuss this because she says he has retired.   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 At home she lives with her sister Gloria Lang and 2 grandchildren, aged 15 and 32. The patient's children are  Gloria Lang, lives in [place] and works as a Librarian, academic in a plant,  and e Gloria Lang, lives in [place] and is disabled. The patient has a total of 13 grandchildren and one great-grandchild. She attends a local [employer]:  not in place    HEALTH MAINTENANCE: Social History  Substance Use Topics  . Smoking status: Never Smoker   . Smokeless tobacco: Not on file  . Alcohol Use: No     Colonoscopy: 2006?  PAP: 2014  Bone density:  Lipid panel:  No Known Allergies  Current Outpatient Prescriptions  Medication Sig Dispense Refill  . dexamethasone (DECADRON) 4 MG tablet Take 2 tablets by mouth once a day on the day after chemotherapy and then take 2 tablets two times a day for 2 days. Take with food. 30 tablet 1  . diphenhydrAMINE (BENADRYL) 25 mg capsule Take 25 mg by mouth at bedtime.    . lidocaine-prilocaine (EMLA) cream Apply to affected area once 30 g 3  . loratadine (CLARITIN) 10 MG tablet Take 1 tablet (10 mg total) by mouth daily. 30 tablet 1  . Multiple Vitamin (MULTIVITAMIN WITH MINERALS) TABS tablet Take 1 tablet by mouth  daily.    . ondansetron (ZOFRAN) 8 MG tablet Take 1 tablet (8 mg total) by mouth 2 (two) times daily as needed. Start on the third day after chemotherapy. 30 tablet 1  . zolpidem (AMBIEN) 5 MG tablet Take 1-2 tablets (5-10 mg total) by mouth at bedtime as needed for sleep. 30 tablet 0  . prochlorperazine (COMPAZINE) 10 MG tablet Take 1 tablet (10 mg total) by mouth every 6 (six) hours as needed (Nausea or vomiting). (Patient not taking: Reported on 09/10/2015) 30 tablet 1  . valACYclovir (VALTREX) 500 MG tablet Take 1 tablet (500 mg total) by mouth daily. 20 tablet 0   No current facility-administered medications for this visit.    OBJECTIVE: Middle-aged African-American woman in no acute distress  Filed Vitals:   09/10/15 1014  BP: 154/98  Pulse: 90  Temp: 98 F (36.7 C)  Resp: 18     There is no weight on file to calculate BMI.    ECOG FS:0 - Asymptomatic   Skin: warm, dry  HEENT: sclerae anicteric, conjunctivae pink, oropharynx clear. Singular ulcer to corner of left mouth. Lymph Nodes: No cervical or supraclavicular  lymphadenopathy  Lungs: clear to auscultation bilaterally, no rales, wheezes, or rhonci  Heart: regular rate and rhythm  Abdomen: round, soft, non tender, positive bowel sounds  Musculoskeletal: No focal spinal tenderness, no peripheral edema  Neuro: non focal, well oriented, positive affect  Breasts: deferred  LAB RESULTS:  CMP     Component Value Date/Time   NA 137 09/10/2015 0950   NA 142 08/19/2015 1417   K 4.4 09/10/2015 0950   K 4.9 08/19/2015 1417   CL 106 08/19/2015 1417   CO2 27 09/10/2015 0950   CO2 26 08/19/2015 1417   GLUCOSE 127 09/10/2015 0950   GLUCOSE 82 08/19/2015 1417   BUN 12.3 09/10/2015 0950   BUN 13 08/19/2015 1417   CREATININE 0.9 09/10/2015 0950   CREATININE 0.90 08/19/2015 1417   CALCIUM 9.8 09/10/2015 0950   CALCIUM 10.1 08/19/2015 1417   PROT 6.9 09/10/2015 0950   ALBUMIN 4.1 09/10/2015 0950   AST 24 09/10/2015 0950   ALT 28 09/10/2015 0950   ALKPHOS 125 09/10/2015 0950   BILITOT <0.30 09/10/2015 0950   GFRNONAA >60 08/19/2015 1417   GFRAA >60 08/19/2015 1417    INo results found for: SPEP, UPEP  Lab Results  Component Value Date   WBC 39.8* 09/10/2015   NEUTROABS 35.6* 09/10/2015   HGB 12.4 09/10/2015   HCT 37.6 09/10/2015   MCV 92.3 09/10/2015   PLT 217 09/10/2015      Chemistry      Component Value Date/Time   NA 137 09/10/2015 0950   NA 142 08/19/2015 1417   K 4.4 09/10/2015 0950   K 4.9 08/19/2015 1417   CL 106 08/19/2015 1417   CO2 27 09/10/2015 0950   CO2 26 08/19/2015 1417   BUN 12.3 09/10/2015 0950   BUN 13 08/19/2015 1417   CREATININE 0.9 09/10/2015 0950   CREATININE 0.90 08/19/2015 1417      Component Value Date/Time   CALCIUM 9.8 09/10/2015 0950   CALCIUM 10.1 08/19/2015 1417   ALKPHOS 125 09/10/2015 0950   AST 24 09/10/2015 0950   ALT 28 09/10/2015 0950   BILITOT <0.30 09/10/2015 0950       No results found for: LABCA2  No components found for: FGHWE993  No results for input(s): INR in the last 168  hours.  Urinalysis No results found  for: COLORURINE, APPEARANCEUR, LABSPEC, PHURINE, GLUCOSEU, HGBUR, BILIRUBINUR, KETONESUR, PROTEINUR, UROBILINOGEN, NITRITE, LEUKOCYTESUR    ELIGIBLE FOR AVAILABLE RESEARCH PROTOCOL: B 51, [facility]  STUDIES: Dg Chest 1 View  08/20/2015  CLINICAL DATA:  Followup pneumothorax. EXAM: FLOURO GUIDE CV LINE - NO REPORT; CHEST  1 VIEW COMPARISON:  08/20/2015 FINDINGS: The power port tip is in the distal SVC. The cardiac silhouette, mediastinal and hilar contours are normal. The lungs are clear. No pneumothorax identified. IMPRESSION: No acute cardiopulmonary findings.  No pneumothorax. Electronically Signed   By: Marijo Sanes M.D.   On: 08/20/2015 17:09   Ct Chest W Contrast  08/26/2015  CLINICAL DATA:  New diagnosis of LEFT breast cancer. Diagnosis March 2017. Hysterectomy. EXAM: CT CHEST, ABDOMEN, AND PELVIS WITH CONTRAST TECHNIQUE: Multidetector CT imaging of the chest, abdomen and pelvis was performed following the standard protocol during bolus administration of intravenous contrast. CONTRAST:  123m ISOVUE-300 IOPAMIDOL (ISOVUE-300) INJECTION 61% COMPARISON:  None. FINDINGS: CT CHEST FINDINGS Mediastinum/Nodes: The port in the LEFT chest wall. Biopsy clip in the posterior lateral aspect of the LEFT breast within a small rounded mass measuring 2 cm. Cluster rounded LEFT axillary lymph nodes in the high axilla. For example 8 mm lymph node image 17, series 2 and 8 mm lymph node on image 21 series 2. Sub pectoralis lymph node measuring 6 mm on image 12, series 2. No enlarged internal mammary lymph nodes. No supraclavicular lymph nodes identified. No mediastinal hilar lymph nodes. No pericardial fluid. No central pulmonary embolism.  Esophagus normal. Lungs/Pleura: No suspicious pulmonary nodule. Airways normal. No pleural abnormality. Musculoskeletal: No aggressive osseous lesion. CT ABDOMEN AND PELVIS FINDINGS Hepatobiliary: No focal hepatic lesion. No biliary ductal  dilatation. Gallbladder is normal. Common bile duct is normal. Pancreas: Pancreas is normal. No ductal dilatation. No pancreatic inflammation. Spleen: Normal spleen Adrenals/urinary tract: Adrenal glands and kidneys are normal. The ureters and bladder normal. Stomach/Bowel: Stomach, small bowel, appendix, and cecum are normal. The colon and rectosigmoid colon are normal. Vascular/Lymphatic: Abdominal aorta is normal caliber. There is no retroperitoneal or periportal lymphadenopathy. No pelvic lymphadenopathy. Reproductive: Post hysterectomy anatomy.  Ovaries not identified. Other: No free fluid. Musculoskeletal: No aggressive osseous lesion. IMPRESSION: Chest Impression: 1. Biopsy clip within small lateral left breast mass. 2. Several high LEFT axillary lymph nodes have suspicious morphology but are not pathologic by CT size criteria. 3. No evidence of central nodal metastasis. 4. No pulmonary metastasis. Abdomen / Pelvis Impression: 1. No evidence metastasis in soft tissues of the abdomen or pelvis. 2. No skeletal metastasis. Electronically Signed   By: SSuzy BouchardM.D.   On: 08/26/2015 13:01   Nm Bone Scan Whole Body  08/26/2015  CLINICAL DATA:  New diagnosis of left breast carcinoma EXAM: NUCLEAR MEDICINE WHOLE BODY BONE SCAN TECHNIQUE: Whole body anterior and posterior images were obtained approximately 3 hours after intravenous injection of radiopharmaceutical. RADIOPHARMACEUTICALS:  26.0 mCi Technetium-917mDP IV COMPARISON:  CT chest abdomen pelvis of 08/26/2015 FINDINGS: The patient was injected with 2617.5illicuries of technetium 9960 MDP intravenously and total body bone scan was performed. Minimal activity is noted in the lower cervical spine. Also there is faint activity in the region of L2 and L3 vertebral bodies. This activity most likely is due to degenerative change with no lytic or blastic lesion noted on bone window images from today's CT. Mild activity is also noted in the left femoral  greater trochanter and no lytic or blastic lesion is noted at that site on  today's CT scan. IMPRESSION: 1. No definite bone metastasis are noted. 2. Foci of of increased activity in the lower cervical spine, mid upper lumbar spine, and left femoral greater trochanter most likely all are degenerative in origin with no lytic or blastic lesion demonstrated on bone window images from today's CT at those sites. Electronically Signed   By: Dwyane Dee M.D.   On: 08/26/2015 13:29   Ct Abdomen Pelvis W Contrast  08/26/2015  CLINICAL DATA:  New diagnosis of LEFT breast cancer. Diagnosis March 2017. Hysterectomy. EXAM: CT CHEST, ABDOMEN, AND PELVIS WITH CONTRAST TECHNIQUE: Multidetector CT imaging of the chest, abdomen and pelvis was performed following the standard protocol during bolus administration of intravenous contrast. CONTRAST:  ISOVUE-300 IOPAMIDOL (ISOVUE-300) INJECTION 61% COMPARISON:  None. FINDINGS: CT CHEST FINDINGS Mediastinum/Nodes: The port in the LEFT chest wall. Biopsy clip in the posterior lateral aspect of the LEFT breast within a small rounded mass measuring 2 cm. Cluster rounded LEFT axillary lymph nodes in the high axilla. For example 8 mm lymph node image 17, series 2 and 8 mm lymph node on image 21 series 2. Sub pectoralis lymph node measuring 6 mm on image 12, series 2. No enlarged internal mammary lymph nodes. No supraclavicular lymph nodes identified. No mediastinal hilar lymph nodes. No pericardial fluid. No central pulmonary embolism.  Esophagus normal. Lungs/Pleura: No suspicious pulmonary nodule. Airways normal. No pleural abnormality. Musculoskeletal: No aggressive osseous lesion. CT ABDOMEN AND PELVIS FINDINGS Hepatobiliary: No focal hepatic lesion. No biliary ductal dilatation. Gallbladder is normal. Common bile duct is normal. Pancreas: Pancreas is normal. No ductal dilatation. No pancreatic inflammation. Spleen: Normal spleen Adrenals/urinary tract: Adrenal glands and kidneys  are normal. The ureters and bladder normal. Stomach/Bowel: Stomach, small bowel, appendix, and cecum are normal. The colon and rectosigmoid colon are normal. Vascular/Lymphatic: Abdominal aorta is normal caliber. There is no retroperitoneal or periportal lymphadenopathy. No pelvic lymphadenopathy. Reproductive: Post hysterectomy anatomy.  Ovaries not identified. Other: No free fluid. Musculoskeletal: No aggressive osseous lesion. IMPRESSION: Chest Impression: 1. Biopsy clip within small lateral left breast mass. 2. Several high LEFT axillary lymph nodes have suspicious morphology but are not pathologic by CT size criteria. 3. No evidence of central nodal metastasis. 4. No pulmonary metastasis. Abdomen / Pelvis Impression: 1. No evidence metastasis in soft tissues of the abdomen or pelvis. 2. No skeletal metastasis. Electronically Signed   By: Genevive Bi M.D.   On: 08/26/2015 13:01   Mr Breast Bilateral W Wo Contrast  08/19/2015  CLINICAL DATA:  Biopsy proven grade 2 invasive mammary carcinoma in the 1:30 o'clock location of the left breast. Metastatic mammary carcinoma biopsied from 1 axillary lymph node. LABS:  None obtained at the time of imaging. EXAM: BILATERAL BREAST MRI WITH AND WITHOUT CONTRAST TECHNIQUE: Multiplanar, multisequence MR images of both breasts were obtained prior to and following the intravenous administration of 13 ml of MultiHance. THREE-DIMENSIONAL MR IMAGE RENDERING ON INDEPENDENT WORKSTATION: Three-dimensional MR images were rendered by post-processing of the original MR data on an independent workstation. The three-dimensional MR images were interpreted, and findings are reported in the following complete MRI report for this study. Three dimensional images were evaluated at the independent DynaCad workstation COMPARISON:  Previous exam(s). FINDINGS: Breast composition: c. Heterogeneous fibroglandular tissue. Background parenchymal enhancement: Moderate. Right breast: No mass or  abnormal enhancement. Left breast: There is a 7.6 x 4.2 x 2.3 (anterior posterior, transverse and craniocaudal dimensions) irregular enhancing mass in the middle and posterior thirds of the  lateral aspect of the left breast corresponding with the biopsy proven invasive mammary carcinoma. Signal void artifact from the biopsy clip is seen in the posterior aspect of the enhancing mass. Lymph nodes: Several enlarged level 1 left axillary lymph nodes are visualized. One of them has biopsy changes and a signal void artifact from the biopsy clip. The largest lymph node measures 1.6 cm. Ancillary findings:  None. IMPRESSION: 7.6 cm enhancing irregular mass in the lateral aspect of the left breast corresponding with the biopsy proven invasive mammary carcinoma. Enlarged left axillary adenopathy corresponding with the known metastatic disease. RECOMMENDATION: Treatment planning of the known left breast invasive mammary carcinoma and metastatic axillary adenopathy is recommended. The surgical clip is located along the posterior aspect of the carcinoma. If pathologically proving the extent of disease is clinically indicated the anterior aspect of the enhancing mass could be biopsied with MR guidance. BI-RADS CATEGORY  6: Known biopsy-proven malignancy. Electronically Signed   By: Lillia Mountain M.D.   On: 08/19/2015 11:34   Dg Chest Port 1 View  08/20/2015  CLINICAL DATA:  Left Port-A-Cath insertion EXAM: PORTABLE CHEST 1 VIEW COMPARISON:  None. FINDINGS: Left subclavian Port-A-Cath terminates at the cavoatrial junction. Normal heart size. Normal mediastinal contour. No right pneumothorax. Questionable asymmetric lucency at the left lung apex, cannot exclude a tiny left apical pneumothorax. No pleural effusion. No pulmonary edema. Mild patchy opacity at the lateral left lung base. IMPRESSION: 1. Questionable asymmetric lucency at the left lung apex, cannot exclude a tiny left apical pneumothorax. Short-term follow-up chest  radiograph advised. 2. Well-positioned left subclavian Port-A-Cath. 3. Mild patchy left lung base opacity, favor atelectasis. These results were called by telephone at the time of interpretation on 08/20/2015 at 1:18 pm to RN [facility], who verbally acknowledged these results. Electronically Signed   By: Ilona Sorrel M.D.   On: 08/20/2015 13:19   Dg Fluoro Guide Cv Line-no Report  08/20/2015  CLINICAL DATA:  FLOURO GUIDE CV LINE Fluoroscopy was utilized by the requesting physician.  No radiographic interpretation.    ASSESSMENT: 55 y.o.  woman status post left breast upper outer quadrant and left axillary lymph node biopsy 08/05/2015, both positive for an invasive ductal carcinoma, grade 2 or 3, triple negative, with an MIB-1 between 10 and 60%   (1) neoadjuvant chemotherapy consisting of doxorubicin and cyclophosphamide in dose dense fashion 4 started 08/27/2015, to be followed by weekly paclitaxel and carboplatin 12  (2) definitive surgery we will follow chemotherapy  (3) adjuvant radiation to follow as appropriate  (4) genetics testing scheduled for 09/17/2015  PLAN: Anaiah says she feels at "100%" today. The labs were reviewed in detail and she has marked leukocytosis. I discussed this with Dr. Jana Hakim. He suggests proceeding with cycle 2 of doxorubicin and cyclophosphamide as planned today. She will receive neulasta despite the high ANC because she does not plan to return for a nadir visit next week due to a court date in another state. We want to avoid severe neutropenia during this time.   I sent in a prescription for valacyclovir '500mg'$  to be taken daily until the sore in the corner of her left mouth heals.  Leathia will return in 2 weeks for cycle 3 of treatment. She understands and agrees with this plan. She knows the goal of treatment in her case is cure. She has been encouraged to call with any issues that might arise before her next visit here.  Laurie Panda, NP    09/10/2015  10:41 AM

## 2015-09-10 NOTE — Patient Instructions (Addendum)
[facility] Discharge Instructions for Patients Receiving Chemotherapy  Today you received the following chemotherapy agents Adriamycin /Cytoxan  To help prevent nausea and vomiting after your treatment, we encourage you to take your nausea medication     If you develop nausea and vomiting that is not controlled by your nausea medication, call the clinic.   BELOW ARE SYMPTOMS THAT SHOULD BE REPORTED IMMEDIATELY:  *FEVER GREATER THAN 100.5 F  *CHILLS WITH OR WITHOUT FEVER  NAUSEA AND VOMITING THAT IS NOT CONTROLLED WITH YOUR NAUSEA MEDICATION  *UNUSUAL SHORTNESS OF BREATH  *UNUSUAL BRUISING OR BLEEDING  TENDERNESS IN MOUTH AND THROAT WITH OR WITHOUT PRESENCE OF ULCERS  *URINARY PROBLEMS  *BOWEL PROBLEMS  UNUSUAL RASH Items with * indicate a potential emergency and should be followed up as soon as possible.  Feel free to call the clinic you have any questions or concerns. The clinic phone number is [phone].  Please show the CHEMO ALERT CARD at check-in to the Emergency Department and triage nurse.   

## 2015-09-15 ENCOUNTER — Telehealth: Payer: Self-pay | Admitting: *Deleted

## 2015-09-15 DIAGNOSIS — C50412 Malignant neoplasm of upper-outer quadrant of left female breast: Secondary | ICD-10-CM

## 2015-09-15 MED ORDER — FLUCONAZOLE 100 MG PO TABS: 100.0000 mg | ORAL_TABLET | Freq: Every day | ORAL | Status: AC

## 2015-09-15 NOTE — Telephone Encounter (Signed)
Spoke with patient who states that she used biotin and the "white film" on her tongue cleared up.  She continues to have a sore throat and "does not want to be seen, she just wants the antibiotics."  Per Selena Lesser, NP patient has three choices, first one is to be seen by Cyndee, the second one is to try diflucan 100 mg daily and have fluids because she may be dehydrated and the third is to do nothing because Cyndee does not feel that this sore throat needs antibiotics. Writer called patient back- she does not want to be seen.  She states she will try the diflucan if NP is not willing to give her an antibiotic.  She also states that she is not dehydrated because she "drinking plenty of water".  Writer will send the diflucan to patient's [facility] per patient's request.

## 2015-09-15 NOTE — Telephone Encounter (Signed)
"  I need a refill on the ampicillin for sore throat.  I saw Ms. Berniece Salines after my first chemo and sore throat.  Now with the second chemotherapy I have a sore throat.  I called this morning at 0200 and was told to call back when you all arrive.   [phone])"

## 2015-09-17 ENCOUNTER — Encounter: Payer: BLUE CROSS/BLUE SHIELD | Admitting: Genetic Counselor

## 2015-09-17 ENCOUNTER — Other Ambulatory Visit: Payer: BLUE CROSS/BLUE SHIELD

## 2015-09-18 ENCOUNTER — Telehealth: Payer: Self-pay

## 2015-09-18 NOTE — Telephone Encounter (Signed)
Writer called to check on patient to find out whether her throat was better and how she was doing on the diflucan.  Patient states she did not pick up the medication from the pharmacy and states that her throat is no longer sore, she is able to eat and that she no longer has a white film on her tongue.

## 2015-09-22 ENCOUNTER — Encounter: Payer: Self-pay | Admitting: Nurse Practitioner

## 2015-09-22 NOTE — Progress Notes (Signed)
Patient called and left voicemail regarding her grant from here and reporting for her food stamps. Called patient back to follow up from voicemail, introduced myself in Gloria Lang's absence and how I may help. Patient states she did not want to lose her public assistance or assistance with [name] and her DSS caseworker advised her to contact us to follow up. I asked patient what other type of assistance she was receiving. Patient states she has a grant from [name] and assistance with medication for her chemo. Reviewed patient's status on both. Advised patient that she had the one-time [place] which she only has $100 remaining to spend and the assistance for her drugs will be paid directly to the hospital. Advised patient that she may want to contact her worker back to let her know how the grant and co-pay assistance work and that the funds are not paid directly to her and may not affect her income and that she may want to contact her manager to get clarification. Patient states she would contact the worker. Called patient back for her to take down my name and contact number and asked if they needed to call me to verify this information, if it was ok to confirm and release the basic information to them and she said yes. Patient has my name and number for any further questions or concerns.

## 2015-09-23 ENCOUNTER — Encounter (HOSPITAL_COMMUNITY): Payer: Self-pay | Admitting: Emergency Medicine

## 2015-09-23 ENCOUNTER — Emergency Department (HOSPITAL_COMMUNITY)
Admission: EM | Admit: 2015-09-23 | Discharge: 2015-09-23 | Disposition: A | Discharge status: Home / Self Care | Payer: BLUE CROSS/BLUE SHIELD | Attending: Emergency Medicine | Admitting: Emergency Medicine

## 2015-09-23 DIAGNOSIS — F919 Conduct disorder, unspecified: Secondary | ICD-10-CM | POA: Insufficient documentation

## 2015-09-23 DIAGNOSIS — F22 Delusional disorders: Secondary | ICD-10-CM | POA: Diagnosis not present

## 2015-09-23 DIAGNOSIS — J45909 Unspecified asthma, uncomplicated: Secondary | ICD-10-CM | POA: Diagnosis not present

## 2015-09-23 LAB — RAPID URINE DRUG SCREEN, HOSP PERFORMED
Amphetamines: NOT DETECTED
BARBITURATES: NOT DETECTED
Benzodiazepines: NOT DETECTED
COCAINE: NOT DETECTED
Opiates: NOT DETECTED
TETRAHYDROCANNABINOL: NOT DETECTED

## 2015-09-23 NOTE — ED Notes (Addendum)
Pt. arrived with PTAR from home presents with paranoid behavior - reports being poisoned by neighbor this evening , refused blood tests , verbally abusive with staff at arrival , flight of ideas / poor historian . Denies hallucinations / no suicidal ideation .

## 2015-09-23 NOTE — ED Provider Notes (Signed)
I never saw this patient as they were not put into a room   Ripley Fraise, MD 09/23/15 [id number]

## 2015-09-23 NOTE — ED Notes (Signed)
No answer when called for room assignment  

## 2015-09-24 ENCOUNTER — Telehealth: Payer: Self-pay | Admitting: *Deleted

## 2015-09-24 ENCOUNTER — Other Ambulatory Visit: Payer: BLUE CROSS/BLUE SHIELD

## 2015-09-24 ENCOUNTER — Observation Stay (HOSPITAL_COMMUNITY)
Admission: EM | Admit: 2015-09-24 | Discharge: 2015-09-24 | Disposition: A | Discharge status: Home / Self Care | Payer: BLUE CROSS/BLUE SHIELD | Attending: Internal Medicine | Admitting: Internal Medicine

## 2015-09-24 ENCOUNTER — Encounter (HOSPITAL_COMMUNITY): Payer: Self-pay | Admitting: Emergency Medicine

## 2015-09-24 ENCOUNTER — Other Ambulatory Visit: Payer: Self-pay | Admitting: Nurse Practitioner

## 2015-09-24 ENCOUNTER — Encounter: Payer: Self-pay | Admitting: *Deleted

## 2015-09-24 ENCOUNTER — Ambulatory Visit: Payer: BLUE CROSS/BLUE SHIELD | Admitting: Nurse Practitioner

## 2015-09-24 ENCOUNTER — Emergency Department (HOSPITAL_COMMUNITY): Payer: BLUE CROSS/BLUE SHIELD

## 2015-09-24 ENCOUNTER — Ambulatory Visit: Payer: BLUE CROSS/BLUE SHIELD

## 2015-09-24 DIAGNOSIS — D72829 Elevated white blood cell count, unspecified: Secondary | ICD-10-CM | POA: Diagnosis not present

## 2015-09-24 DIAGNOSIS — C50412 Malignant neoplasm of upper-outer quadrant of left female breast: Secondary | ICD-10-CM | POA: Diagnosis not present

## 2015-09-24 DIAGNOSIS — M1990 Unspecified osteoarthritis, unspecified site: Secondary | ICD-10-CM | POA: Diagnosis not present

## 2015-09-24 DIAGNOSIS — Z79899 Other long term (current) drug therapy: Secondary | ICD-10-CM | POA: Diagnosis not present

## 2015-09-24 DIAGNOSIS — G4733 Obstructive sleep apnea (adult) (pediatric): Secondary | ICD-10-CM | POA: Diagnosis not present

## 2015-09-24 DIAGNOSIS — F3010 Manic episode without psychotic symptoms, unspecified: Secondary | ICD-10-CM

## 2015-09-24 DIAGNOSIS — F22 Delusional disorders: Secondary | ICD-10-CM | POA: Diagnosis not present

## 2015-09-24 DIAGNOSIS — F39 Unspecified mood [affective] disorder: Principal | ICD-10-CM | POA: Diagnosis present

## 2015-09-24 DIAGNOSIS — J4520 Mild intermittent asthma, uncomplicated: Secondary | ICD-10-CM

## 2015-09-24 DIAGNOSIS — F309 Manic episode, unspecified: Secondary | ICD-10-CM | POA: Diagnosis present

## 2015-09-24 DIAGNOSIS — K219 Gastro-esophageal reflux disease without esophagitis: Secondary | ICD-10-CM

## 2015-09-24 DIAGNOSIS — R410 Disorientation, unspecified: Secondary | ICD-10-CM

## 2015-09-24 DIAGNOSIS — G9340 Encephalopathy, unspecified: Secondary | ICD-10-CM | POA: Diagnosis not present

## 2015-09-24 LAB — COMPREHENSIVE METABOLIC PANEL
ALBUMIN: 4.5 g/dL (ref 3.5–5.0)
ALT: 53 U/L (ref 14–54)
ANION GAP: 11 (ref 5–15)
AST: 47 U/L — AB (ref 15–41)
Alkaline Phosphatase: 108 U/L (ref 38–126)
BILIRUBIN TOTAL: 0.4 mg/dL (ref 0.3–1.2)
BUN: 15 mg/dL (ref 6–20)
CHLORIDE: 104 mmol/L (ref 101–111)
CO2: 25 mmol/L (ref 22–32)
Calcium: 9.6 mg/dL (ref 8.9–10.3)
Creatinine, Ser: 0.79 mg/dL (ref 0.44–1.00)
GFR calc Af Amer: >60 mL/min (ref >60)
GFR calc non Af Amer: >60 mL/min (ref >60)
GLUCOSE: 150 mg/dL — AB (ref 65–99)
POTASSIUM: 4.2 mmol/L (ref 3.5–5.1)
SODIUM: 140 mmol/L (ref 135–145)
TOTAL PROTEIN: 6.9 g/dL (ref 6.5–8.1)

## 2015-09-24 LAB — CBC
HEMATOCRIT: 28.2 % — AB (ref 36.0–46.0)
Hemoglobin: 9.4 g/dL — ABNORMAL LOW (ref 12.0–15.0)
MCH: 31.2 pg (ref 26.0–34.0)
MCHC: 33.3 g/dL (ref 30.0–36.0)
MCV: 93.7 fL (ref 78.0–100.0)
PLATELETS: 357 10*3/uL (ref 150–400)
RBC: 3.01 MIL/uL — AB (ref 3.87–5.11)
RDW: 15.1 % (ref 11.5–15.5)
WBC: 29.3 10*3/uL — AB (ref 4.0–10.5)

## 2015-09-24 LAB — RAPID URINE DRUG SCREEN, HOSP PERFORMED
AMPHETAMINES: NOT DETECTED
BENZODIAZEPINES: NOT DETECTED
Barbiturates: NOT DETECTED
COCAINE: NOT DETECTED
Opiates: NOT DETECTED
Tetrahydrocannabinol: NOT DETECTED

## 2015-09-24 LAB — ACETAMINOPHEN LEVEL

## 2015-09-24 LAB — ETHANOL

## 2015-09-24 LAB — SALICYLATE LEVEL: Salicylate Lvl: <4 mg/dL (ref 2.8–30.0)

## 2015-09-24 LAB — APTT: APTT: 26 s (ref 24–37)

## 2015-09-24 LAB — PROTIME-INR
INR: 1.02 (ref 0.00–1.49)
Prothrombin Time: 13.6 seconds (ref 11.6–15.2)

## 2015-09-24 LAB — PROCALCITONIN: PROCALCITONIN: 0.19 ng/mL

## 2015-09-24 LAB — LACTIC ACID, PLASMA: LACTIC ACID, VENOUS: 1 mmol/L (ref 0.5–2.0)

## 2015-09-24 MED ORDER — ONDANSETRON HCL 4 MG/2ML IJ SOLN
4.0000 mg | Freq: Three times a day (TID) | INTRAMUSCULAR | Status: DC | PRN
Start: 2015-09-24 — End: 2015-09-24

## 2015-09-24 MED ORDER — HEPARIN SOD (PORK) LOCK FLUSH 100 UNIT/ML IV SOLN
500.0000 [IU] | INTRAVENOUS | Status: AC | PRN
  Administered 2015-09-24: 500 [IU]

## 2015-09-24 MED ORDER — SODIUM CHLORIDE 0.9% FLUSH: 3.0000 mL | Freq: Two times a day (BID) | INTRAVENOUS | Status: DC

## 2015-09-24 MED ORDER — FLUCONAZOLE 100 MG PO TABS
100.0000 mg | ORAL_TABLET | Freq: Every day | ORAL | Status: DC
  Administered 2015-09-24: 100 mg via ORAL
  Filled 2015-09-24: qty 1

## 2015-09-24 MED ORDER — LORAZEPAM 2 MG/ML IJ SOLN
1.0000 mg | INTRAMUSCULAR | Status: DC | PRN
  Administered 2015-09-24: 1 mg via INTRAMUSCULAR
  Filled 2015-09-24: qty 1

## 2015-09-24 MED ORDER — LORAZEPAM 2 MG/ML IJ SOLN: 1.0000 mg | INTRAMUSCULAR | Status: DC | PRN

## 2015-09-24 MED ORDER — ACETAMINOPHEN 650 MG RE SUPP: 650.0000 mg | Freq: Four times a day (QID) | RECTAL | Status: DC | PRN

## 2015-09-24 MED ORDER — VALACYCLOVIR HCL 500 MG PO TABS
500.0000 mg | ORAL_TABLET | Freq: Every day | ORAL | Status: DC
  Administered 2015-09-24: 500 mg via ORAL
  Filled 2015-09-24: qty 1

## 2015-09-24 MED ORDER — SODIUM CHLORIDE 0.9 % IV SOLN
INTRAVENOUS | Status: DC
Start: 2015-09-24 — End: 2015-09-24

## 2015-09-24 MED ORDER — ENOXAPARIN SODIUM 40 MG/0.4ML ~~LOC~~ SOLN: 40.0000 mg | SUBCUTANEOUS | Status: DC

## 2015-09-24 MED ORDER — IOPAMIDOL (ISOVUE-300) INJECTION 61%
75.0000 mL | Freq: Once | INTRAVENOUS | Status: AC | PRN
  Administered 2015-09-24: 75 mL via INTRAVENOUS

## 2015-09-24 MED ORDER — SODIUM CHLORIDE 0.9 % IV BOLUS (SEPSIS): 2000.0000 mL | Freq: Once | INTRAVENOUS | Status: DC

## 2015-09-24 MED ORDER — ADULT MULTIVITAMIN W/MINERALS CH
1.0000 | ORAL_TABLET | Freq: Every day | ORAL | Status: DC
  Administered 2015-09-24: 1 via ORAL
  Filled 2015-09-24: qty 1

## 2015-09-24 MED ORDER — LIDOCAINE-PRILOCAINE 2.5-2.5 % EX CREA: TOPICAL_CREAM | Freq: Two times a day (BID) | CUTANEOUS | Status: DC | PRN

## 2015-09-24 MED ORDER — ACETAMINOPHEN 325 MG PO TABS: 650.0000 mg | ORAL_TABLET | Freq: Four times a day (QID) | ORAL | Status: DC | PRN

## 2015-09-24 MED ORDER — ZOLPIDEM TARTRATE 5 MG PO TABS: 5.0000 mg | ORAL_TABLET | Freq: Every evening | ORAL | Status: DC | PRN

## 2015-09-24 MED ORDER — SODIUM CHLORIDE 0.9% FLUSH
10.0000 mL | INTRAVENOUS | Status: DC | PRN
  Administered 2015-09-24: 10 mL
  Filled 2015-09-24: qty 40

## 2015-09-24 NOTE — ED Notes (Signed)
Pt presents from home with 2 grandchildren, pt is loud, abrasive and paranoid. +Flight of ideas. Pt is being treated for breast CA, last chemo 5/4, chemo due tomorrow. Pt states she believes she was poisoned by neighbors and is demanding Narcan, pt became very upset when told her s/s do not indicate she needs narcan.  Pt was seen at [facility] earlier in the night and refused blood work, pt did have neg UDS.

## 2015-09-24 NOTE — H&P (Signed)
History and Physical    Gloria Lang [id number] DOB: [birth year] DOA: 09/24/2015  Referring MD/NP/PA:   PCP: Imelda Pillow, NP   Patient coming from:  The patient is coming from home.  At her baseline, she is independent for most of his ADL.       Chief Complaint: AMS  HPI: Gloria Lang is a 55 y.o. female with medical history significant of breast cancer on chemotherapy, peptic ulcer, asthma, GERD, OSA, who presents with altered mental status.  Per report, pt become confused, has paranoid behavior, reports being poisoned by neighbors, verbally abusive with staff at arrival. Denies hallucinations / no suicidal ideation.  She states that she has loose stool recently which attributes it to chemotherapy, no nausea, vomiting or abdominal pain. She denies fever, chills, chest pain, shortness breath, cough, symptoms of UTI or unilateral weakness. History is very limited due to her flight of ideas. Pt is currently receiving chemotherapy, last dose was a 5/4, is due for next dose today.  ED Course: pt was found to have WBC 29.3, normal temperature, tachycardia, tachypnea, negative UDS, tenderness level less than 10, salicylate level less than 4, except right renal function okay. CT head is negative for acute intracranial abnormalities. Pt is placed on tele bed for obs.  Review of Systems: Could not be accurately reviewed due to altered mental status.  Allergy: No Known Allergies  Past Medical History  Diagnosis Date  . Breast cancer of upper-outer quadrant of left female breast ([place]) 08/07/2015  . Anemia   . Hot flashes   . Family history of adverse reaction to anesthesia     younger sister has same issues with anesthesia  . Complication of anesthesia     patient states she has coded multiple times before with anesthesia. her sister has similar issues.   . Shortness of breath dyspnea     if patient's head is flat (per pt)  . Asthma     only in the summer time- uses albuterol  inhaler  . GERD (gastroesophageal reflux disease)   . History of hiatal hernia     had hiatal hernia repair in '94 (per patient)  . Arthritis   . Hx of peptic ulcer   . Hx of transfusion of packed red blood cells   . Sleep apnea     couldn't tolerate CPAP d/t claustrophobia; sleep upright "90 degree angle": study done at [facility] several years ago    Past Surgical History  Procedure Laterality Date  . Abdominal hysterectomy    . Tubal ligation    . Hernia repair    . Colonoscopy    . Upper gi endoscopy    . Portacath placement N/A 08/20/2015    Procedure: INSERTION PORT-A-CATH;  Surgeon: Stark Klein, MD;  Location: [facility];  Service: General;  Laterality: N/A;    Social History:  reports that she has never smoked. She does not have any smokeless tobacco history on file. She reports that she does not drink alcohol or use illicit drugs.  Family History:  Family History  Problem Relation Age of Onset  . Colon cancer Brother 60  . Throat cancer Maternal Grandfather   . Lung cancer Maternal Uncle   . Ovarian cancer Sister 37    suspected but not pathologically confirmed  . Lung cancer Paternal Uncle   . Breast cancer Maternal Aunt 60     Prior to Admission medications   Medication Sig Start Date End Date Taking? Authorizing Provider  diphenhydrAMINE (  BENADRYL) 25 mg capsule Take 25 mg by mouth at bedtime.   Yes Historical Provider, MD  lidocaine-prilocaine (EMLA) cream Apply to affected area once 08/21/15  Yes Laurie Panda, NP  loratadine (CLARITIN) 10 MG tablet Take 1 tablet (10 mg total) by mouth daily. 08/21/15  Yes Laurie Panda, NP  Multiple Vitamin (MULTIVITAMIN WITH MINERALS) TABS tablet Take 1 tablet by mouth daily.   Yes Historical Provider, MD  prochlorperazine (COMPAZINE) 10 MG tablet Take 1 tablet (10 mg total) by mouth every 6 (six) hours as needed (Nausea or vomiting). 08/21/15  Yes Laurie Panda, NP  valACYclovir (VALTREX) 500 MG tablet Take 1  tablet (500 mg total) by mouth daily. 09/10/15  Yes Laurie Panda, NP  zolpidem (AMBIEN) 5 MG tablet Take 1-2 tablets (5-10 mg total) by mouth at bedtime as needed for sleep. 09/03/15  Yes Chauncey Cruel, MD  dexamethasone (DECADRON) 4 MG tablet Take 2 tablets by mouth once a day on the day after chemotherapy and then take 2 tablets two times a day for 2 days. Take with food. 08/21/15   Laurie Panda, NP  fluconazole (DIFLUCAN) 100 MG tablet Take 1 tablet (100 mg total) by mouth daily. 09/15/15   Susanne Borders, NP  ondansetron (ZOFRAN) 8 MG tablet Take 1 tablet (8 mg total) by mouth 2 (two) times daily as needed. Start on the third day after chemotherapy. 08/21/15   Laurie Panda, NP    Physical Exam: Filed Vitals:   09/24/15 0034  BP: 153/93  Pulse: 105  Temp: 97.9 F (36.6 C)  TempSrc: Oral  Resp: 21  SpO2: 99%   General: Not in acute distress HEENT:       Eyes: PERRL, EOMI, no scleral icterus.       ENT: No discharge from the ears and nose, no pharynx injection, no tonsillar enlargement.        Neck: No JVD, no bruit, no mass felt. Heme: No neck lymph node enlargement. Cardiac: S1/S2, RRR, Tachycardia, No murmurs, No gallops or rubs. Pulm:  No rales, wheezing, rhonchi or rubs. Has portal A-line over left upper chest with clean surroundings. Abd: Soft, nondistended, nontender, no rebound pain, no organomegaly, BS present. GU: No hematuria Ext: No pitting leg edema bilaterally. 2+DP/PT pulse bilaterally. Musculoskeletal: No joint deformities, No joint redness or warmth, no limitation of ROM in spin. Skin: No rashes.  Neuro: Alert, oriented X3, cranial nerves II-XII grossly intact, moves all extremities normally. Psych: Patient is Paranoid, no suicidal or hemocidal ideation.  Labs on Admission: I have personally reviewed following labs and imaging studies  CBC:  Recent Labs Lab 09/24/15 0152  WBC 29.3*  HGB 9.4*  HCT 28.2*  MCV 93.7  PLT [id number]   Basic Metabolic  Panel:  Recent Labs Lab 09/24/15 0152  NA 140  K 4.2  CL 104  CO2 25  GLUCOSE 150*  BUN 15  CREATININE 0.79  CALCIUM 9.6   GFR: CrCl cannot be calculated (Unknown ideal weight.). Liver Function Tests:  Recent Labs Lab 09/24/15 0152  AST 47*  ALT 53  ALKPHOS 108  BILITOT 0.4  PROT 6.9  ALBUMIN 4.5   No results for input(s): LIPASE, AMYLASE in the last 168 hours. No results for input(s): AMMONIA in the last 168 hours. Coagulation Profile: No results for input(s): INR, PROTIME in the last 168 hours. Cardiac Enzymes: No results for input(s): CKTOTAL, CKMB, CKMBINDEX, TROPONINI in the last 168 hours. BNP (last 3  results) No results for input(s): PROBNP in the last 8760 hours. HbA1C: No results for input(s): HGBA1C in the last 72 hours. CBG: No results for input(s): GLUCAP in the last 168 hours. Lipid Profile: No results for input(s): CHOL, HDL, LDLCALC, TRIG, CHOLHDL, LDLDIRECT in the last 72 hours. Thyroid Function Tests: No results for input(s): TSH, T4TOTAL, FREET4, T3FREE, THYROIDAB in the last 72 hours. Anemia Panel: No results for input(s): VITAMINB12, FOLATE, FERRITIN, TIBC, IRON, RETICCTPCT in the last 72 hours. Urine analysis: No results found for: COLORURINE, APPEARANCEUR, LABSPEC, PHURINE, GLUCOSEU, HGBUR, BILIRUBINUR, KETONESUR, PROTEINUR, UROBILINOGEN, NITRITE, LEUKOCYTESUR Sepsis Labs: @LABRCNTIP (procalcitonin:4,lacticidven:4) )No results found for this or any previous visit (from the past 240 hour(s)).   Radiological Exams on Admission: Ct Head W Wo Contrast  09/24/2015  CLINICAL DATA:  Initial evaluation for acute altered mental status. EXAM: CT HEAD WITHOUT AND WITH CONTRAST TECHNIQUE: Contiguous axial images were obtained from the base of the skull through the vertex without and with intravenous contrast CONTRAST:  43mL ISOVUE-300 IOPAMIDOL (ISOVUE-300) INJECTION 61% COMPARISON:  None. FINDINGS: There is no acute intracranial hemorrhage or infarct.  No mass lesion or midline shift. Gray-white matter differentiation is well maintained. Ventricles are normal in size without evidence of hydrocephalus. CSF containing spaces are within normal limits. No extra-axial fluid collection. No abnormal enhancement. The calvarium is intact. Orbital soft tissues are within normal limits. The paranasal sinuses and mastoid air cells are well pneumatized and free of fluid. Scalp soft tissues are unremarkable. IMPRESSION: Normal contrast enhanced CT of the head. No acute intracranial process identified. Electronically Signed   By: Jeannine Boga M.D.   On: 09/24/2015 04:22     EKG:  Not done in ED, will get one.   Assessment/Plan Principal Problem:   Acute encephalopathy Active Problems:   Breast cancer of upper-outer quadrant of left female breast (HCC)   Acute delirium   Leukocytosis   Acute paranoia (HCC)   Asthma, mild intermittent   Acute encephalopathy/paranoid behavior: Etiology is not clear. Potential possibilities the side effects of medications, including Benadryl, Claritin and prochlorperazine. Patient has been taking Decadron for chemotherapy, which can cause psychosis. CT head is negative for acute abnormalities. Patient does not have sigs of infection, her leukocytosis is likely due to steroid use.  - will place pt on tele bed for obs - Hold Benadryl, Claritin, prochlorperazine and Decadron - prn Ativan - sitter at bedside for safety  - Frequent neuro checks -Check UA to r/o UTI  Breast cancer of upper-outer quadrant of left female breast [facility]): has been followed up by dr. Jana Hakim. Pt is receiving chemotherapy, last dose was on 5/4. She is due for next dose today. -Please inform Dr. Joycelyn Schmid in AM  Leukocytosis: Most likely due to steroid use, Decadron. No fever, less likely to have infection. But patient admits criteria of SIRS with leukocytosis and tachycardia. -will get Procalcitonin and trend lactic acid levels -IVF: 2L of  NS bolus in ED, followed by 75 cc/h  -will get Bx if develops fever  Asthma, mild intermittent: not on meds at home. Stable -observe closely.   DVT ppx: SQ Lovenox Code Status: Full code Family Communication: None at bed side.  Disposition Plan:  Anticipate discharge back to previous home environment Consults called:  ED discussed with Oncology, Dr. Marin Olp Admission status: Obs / tele   Date of Service 09/24/2015    Ivor Costa [facility] Pager [phone]  If 7PM-7AM, please contact night-coverage [email or web address] Password [facility] 09/24/2015, 6:46 AM

## 2015-09-24 NOTE — ED Notes (Addendum)
Pt's behavior has steadily escalated.  Pt continues to come to Debbie's desk in a threatening manor.  No outright threats said to [place] or other staff members.  Pt's speech remains pressured, voice is hoarse.  Dr. Claudine Mouton has initiated IVC papers.  TTS consult in progress at this time.  Cannot redirect pt.  Unable to obtain VS on pt at this time.

## 2015-09-24 NOTE — BH Assessment (Addendum)
Tele Assessment Note   Gloria Lang is an 55 y.o. female. Presenting to ED with paranoia and beliefs that someone has poisoned her food and medication, and has placed chemicals in her home vents. PT presented to [facility] earlier in the night however, did not feel comfortable with complying with blood work request and left ED.   Pt presented during interview with rapid and tangential speech and flight of ideas. Pt was oriented to person, situation and place. Pt denied SI and appeared offended and increasingly agitated by inquiry. Pt stated she was a [place] and requested interviewer not ask "questions like that". Pt then proceed to disconnect the assessment cart.   Pt is being treated for breast cancer (last chemo tx 5/4) and is due tomorrow. A review of pt's chart reflects no known psychiatric hx.  Diagnosis: F29 Unspecified psychosis not due to substance or known physiological condition  Past Medical History:  Past Medical History  Diagnosis Date  . Breast cancer of upper-outer quadrant of left female breast ([place]) 08/07/2015  . Anemia   . Hot flashes   . Family history of adverse reaction to anesthesia     younger sister has same issues with anesthesia  . Complication of anesthesia     patient states she has coded multiple times before with anesthesia. her sister has similar issues.   . Shortness of breath dyspnea     if patient's head is flat (per pt)  . Asthma     only in the summer time- uses albuterol inhaler  . GERD (gastroesophageal reflux disease)   . History of hiatal hernia     had hiatal hernia repair in '94 (per patient)  . Arthritis   . Hx of peptic ulcer   . Hx of transfusion of packed red blood cells   . Sleep apnea     couldn't tolerate CPAP d/t claustrophobia; sleep upright "90 degree angle": [facility] done at [facility] years ago    Past Surgical History  Procedure Laterality Date  . Abdominal hysterectomy    . Tubal ligation    . Hernia repair    .  Colonoscopy    . Upper gi endoscopy    . Portacath placement N/A 08/20/2015    Procedure: INSERTION PORT-A-CATH;  Surgeon: Stark Klein, MD;  Location: [facility];  Service: General;  Laterality: N/A;    Family History:  Family History  Problem Relation Age of Onset  . Colon cancer Brother 75  . Throat cancer Maternal Grandfather   . Lung cancer Maternal Uncle   . Ovarian cancer Sister 38    suspected but not pathologically confirmed  . Lung cancer Paternal Uncle   . Breast cancer Maternal Aunt 60    Social History:  reports that she has never smoked. She does not have any smokeless tobacco history on file. She reports that she does not drink alcohol or use illicit drugs.  Additional Social History:  Alcohol / Drug Use Pain Medications: UTA Prescriptions: UTA Over the Counter: UTA History of alcohol / drug use?:  (UTA- UDS clear)  CIWA: CIWA-Ar BP: 153/93 mmHg Pulse Rate: 105 COWS:    PATIENT STRENGTHS: (choose at least two) Average or above average intelligence Religious Affiliation  Allergies: No Known Allergies  Home Medications:  Medications Prior to Admission  Medication Sig Dispense Refill  . diphenhydrAMINE (BENADRYL) 25 mg capsule Take 25 mg by mouth at bedtime.    . lidocaine-prilocaine (EMLA) cream Apply to affected area once 30 g  3  . loratadine (CLARITIN) 10 MG tablet Take 1 tablet (10 mg total) by mouth daily. 30 tablet 1  . Multiple Vitamin (MULTIVITAMIN WITH MINERALS) TABS tablet Take 1 tablet by mouth daily.    . prochlorperazine (COMPAZINE) 10 MG tablet Take 1 tablet (10 mg total) by mouth every 6 (six) hours as needed (Nausea or vomiting). 30 tablet 1  . valACYclovir (VALTREX) 500 MG tablet Take 1 tablet (500 mg total) by mouth daily. 20 tablet 0  . zolpidem (AMBIEN) 5 MG tablet Take 1-2 tablets (5-10 mg total) by mouth at bedtime as needed for sleep. 30 tablet 0  . dexamethasone (DECADRON) 4 MG tablet Take 2 tablets by mouth once a day on the day after  chemotherapy and then take 2 tablets two times a day for 2 days. Take with food. 30 tablet 1  . fluconazole (DIFLUCAN) 100 MG tablet Take 1 tablet (100 mg total) by mouth daily. 7 tablet 0  . ondansetron (ZOFRAN) 8 MG tablet Take 1 tablet (8 mg total) by mouth 2 (two) times daily as needed. Start on the third day after chemotherapy. 30 tablet 1    OB/GYN Status:  No LMP recorded. Patient has had a hysterectomy.  General Assessment Data Location of Assessment: [facility] ED TTS Assessment: In system Is this a Tele or Face-to-Face Assessment?: Tele Assessment Is this an Initial Assessment or a Re-assessment for this encounter?: Initial Assessment Marital status: Other (comment) (UTA due to pt presentation ) Is patient pregnant?: No Pregnancy Status: No Living Arrangements: Other relatives (with grandchildren) Can pt return to current living arrangement?: Yes Admission Status: Voluntary Is patient capable of signing voluntary admission?: No (due to mental status/poor decision making/manic behavior) Referral Source: Self/Family/Friend Insurance type: BCBS     Crisis Care Plan Living Arrangements: Other relatives (with grandchildren) Name of Psychiatrist: UTA due to pt presentation  Name of Therapist: UTA due to pt presentation   Education Status Is patient currently in school?:  (UTA due to pt presentation ) Current Grade:  (UTA due to pt presentation ) Highest grade of school patient has completed:  (UTA due to pt presentation ) Name of school:  (UTA due to pt presentation ) Contact person:  (UTA due to pt presentation )  Risk to self with the past 6 months Suicidal Ideation: No Has patient been a risk to self within the past 6 months prior to admission? : Other (comment) (UTA due to pt presentation ) Suicidal Intent: No Has patient had any suicidal intent within the past 6 months prior to admission? : Other (comment) (UTA due to pt presentation ) Is patient at risk for suicide?:  No Suicidal Plan?: No Has patient had any suicidal plan within the past 6 months prior to admission? : Other (comment) (UTA due to pt presentation ) Access to Means:  (UTA due to pt presentation ) What has been your use of drugs/alcohol within the last 12 months?: UTA due to pt presentation -UDS Clear Previous Attempts/Gestures:  (UTA due to pt presentation ) How many times?:  (UTA due to pt presentation ) Other Self Harm Risks:  (UTA due to pt presentation ) Triggers for Past Attempts:  (UTA due to pt presentation ) Intentional Self Injurious Behavior:  (UTA due to pt presentation ) Family Suicide History:  (UTA due to pt presentation ) Recent stressful life event(s):  (UTA due to pt presentation ) Persecutory voices/beliefs?: Yes Depression:  (UTA due to pt presentation ) Depression Symptoms:  (  UTA due to pt presentation ) Substance abuse history and/or treatment for substance abuse?:  (UTA due to pt presentation ) Suicide prevention information given to non-admitted patients: Not applicable  Risk to Others within the past 6 months Does patient have any lifetime risk of violence toward others beyond the six months prior to admission? :  (UTA due to pt presentation ) Thoughts of Harm to Others:  (UTA due to pt presentation ) Current Homicidal Intent:  (UTA due to pt presentation ) Current Homicidal Plan:  (UTA due to pt presentation ) Access to Homicidal Means:  (UTA due to pt presentation ) Identified Victim:  (UTA due to pt presentation ) History of harm to others?:  (UTA due to pt presentation ) Assessment of Violence:  (UTA due to pt presentation ) Violent Behavior Description:  (UTA due to pt presentation ) Does patient have access to weapons?:  (UTA due to pt presentation ) Criminal Charges Pending?:  (UTA due to pt presentation ) Does patient have a court date:  (UTA due to pt presentation ) Is patient on probation?:  (UTA due to pt presentation )  Psychosis Hallucinations:   (UTA due to pt presentation ) Delusions: Persecutory  Mental Status Report Appearance/Hygiene: Unremarkable Eye Contact: Fair Motor Activity: Restlessness Speech: Rapid, Loud, Tangential Level of Consciousness: Alert Mood: Anxious, Suspicious, Preoccupied Affect: Anxious, Preoccupied, Other (Comment) (Congruent with mood) Anxiety Level: Moderate Thought Processes: Scientist, research (life sciences), Tangential Judgement: Partial Orientation: Place, Situation, Person Obsessive Compulsive Thoughts/Behaviors: None  Cognitive Functioning Concentration: Decreased Memory: Recent Intact, Remote Intact IQ: Average Insight: Poor Impulse Control: Poor Appetite:  (UTA due to pt presentation ) Weight Loss:  (UTA due to pt presentation ) Weight Gain:  (UTA due to pt presentation ) Sleep:  (UTA due to pt presentation ) Total Hours of Sleep:  (UTA due to pt presentation ) Vegetative Symptoms:  (UTA due to pt presentation )  ADLScreening [facility] Assessment Services) Patient's cognitive ability adequate to safely complete daily activities?: Yes Patient able to express need for assistance with ADLs?: Yes Independently performs ADLs?: Yes (appropriate for developmental age)  Prior Inpatient Therapy Prior Inpatient Therapy:  (UTA due to pt presentation -No psychiatric hx noted in chart)  Prior Outpatient Therapy Prior Outpatient Therapy:  (UTA due to pt presentation-No psychiatric hx noted in chart) Does patient have an ACCT team?: Unknown Does patient have Intensive In-House Services?  : Unknown Does patient have [facility] services? : Unknown Does patient have P4CC services?: Unknown  ADL Screening (condition at time of admission) Patient's cognitive ability adequate to safely complete daily activities?: Yes Patient able to express need for assistance with ADLs?: Yes Independently performs ADLs?: Yes (appropriate for developmental age)       Abuse/Neglect Assessment (Assessment to be complete while patient  is alone) Physical Abuse:  (UTA) Verbal Abuse:  (UTA) Sexual Abuse:  (UTA) Exploitation of patient/patient's resources:  (UTA) Self-Neglect:  (UTA) Possible abuse reported to::  (UTA) Values / Beliefs Cultural Requests During Hospitalization:  (UTA) Spiritual Requests During Hospitalization:  (UTA) Consults Spiritual Care Consult Needed:  (UTA) Social Work Consult Needed:  Special educational needs teacher) Regulatory affairs officer (For Healthcare) Does patient have an advance directive?: No Would patient like information on creating an advanced directive?: No - patient declined information    Additional Information 1:1 In Past 12 Months?: No CIRT Risk: Yes Elopement Risk: Yes Does patient have medical clearance?: No     Disposition: Clinician consulted with Lonzo Cloud and pt disposition is pending  AM Psych Eval. PA recommends consideration a medical admission with psychiatric consultation for psych sxs evaluation and management. Pt RN Sharrie Rothman) has been informed of pt disposition.  Disposition Initial Assessment Completed for this Encounter: Yes Disposition of Patient: Other dispositions Other disposition(s): Other (Comment) (Psych Eval, Medical admission w/ psych consultation)  Lorrena Goranson J Martinique 09/24/2015 6:35 AM

## 2015-09-24 NOTE — ED Notes (Signed)
Bed: NONE Expected date:  Expected time:  Means of arrival:  Comments:

## 2015-09-24 NOTE — Care Management Note (Signed)
Case Management Note  Patient Details  Name: Perrie Bascue MRN: [id number] Date of Birth: 10-Jan-1961  Subjective/Objective:  55 y/o f admitted w/AMS. Hx; Paranoia, Breast Ca.1:1. Psych cons.                   Action/Plan:d/c plan IP psych.   Expected Discharge Date:                  Expected Discharge Plan:  IP Rehab Facility  In-House Referral:  Clinical Social Work  Discharge planning Services  CM Consult  Post Acute Care Choice:    Choice offered to:     DME Arranged:    DME Agency:     HH Arranged:    [place] Agency:     Status of Service:  In process, will continue to follow  Medicare Important Message Given:    Date Medicare IM Given:    Medicare IM give by:    Date Additional Medicare IM Given:    Additional Medicare Important Message give by:     If discussed at [place] of Stay Meetings, dates discussed:    Additional Comments:  Dessa Phi, RN 09/24/2015, 12:49 PM

## 2015-09-24 NOTE — ED Notes (Signed)
Security at bedside.  Pt is screaming and inconsolable.  Ativan administered.  Will continue to monitor.
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
## 2015-09-24 NOTE — Progress Notes (Addendum)
I took over care for this patient at 1500.  Pt refusing to be assessed.  Went over all discharge information with patient.  Discharge summary given to patient.  Meds returned to patient from pharmacy.  Per social work, TEFL teacher, pt is not longer pt is no longer Involuntarily committed. Pt refused wheelchair, requested to walk herself to the bus stop.
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
## 2015-09-24 NOTE — ED Notes (Signed)
Delay in lab draw, pt agitated, pacing around room, speaking in incoherent sentences

## 2015-09-24 NOTE — Telephone Encounter (Signed)
Patient wants to reschedule todays apapts

## 2015-09-24 NOTE — Telephone Encounter (Signed)
Per staff message and POF I have scheduled appts. Advised scheduler of appts. JMW  
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
## 2015-09-24 NOTE — Progress Notes (Addendum)
Patient is refusing RN to assess her at this time and not answering admission questions.  She is very angry and agitated regarding Ativan given in the ED and refusing to speak to RN.  MD made aware of patient's concerns and refusal of treatment at this time.  Will continue to monitor.

## 2015-09-24 NOTE — ED Notes (Signed)
Bed: WTR7 Expected date:  Expected time:  Means of arrival:  Comments: Lab
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
## 2015-09-24 NOTE — Telephone Encounter (Signed)
Last entry is wrong....please called and left message to reschedule her appts from today to tomorrow. Patient still in hospital. Message forwarded to the desk RN

## 2015-09-24 NOTE — Care Management Note (Signed)
Case Management Note  Patient Details  Name: Gloria Lang MRN: [id number] Date of Birth: 01-02-1961  Subjective/Objective:                    Action/Plan:d/c home.   Expected Discharge Date:                  Expected Discharge Plan:  Home/Self Care  In-House Referral:  Clinical Social Work  Discharge planning Services  CM Consult  Post Acute Care Choice:    Choice offered to:     DME Arranged:    DME Agency:     HH Arranged:    [place] Agency:     Status of Service:  Completed, signed off  Medicare Important Message Given:    Date Medicare IM Given:    Medicare IM give by:    Date Additional Medicare IM Given:    Additional Medicare Important Message give by:     If discussed at [place] of Stay Meetings, dates discussed:    Additional Comments:  Dessa Phi, RN 09/24/2015, 2:15 PM

## 2015-09-24 NOTE — Progress Notes (Signed)
Pt refusing weight to be taken along with any additional assessment. Pt refuses IV fluids to be set up, and refuses any additional admission questions to be asked. Pt states "I shouldn't be here, I need to go to the cancer center."

## 2015-09-24 NOTE — Progress Notes (Addendum)
LCSW received call from RN and MD regarding patient and disposition. Patient has been cleared by psychiatry to discharge home. LCSW completed IVC change in commitment and faxed to [employer].  Copies placed in patient chart.  No other needs at this time for Psych. Will sign off.  Lane Hacker, MSW Clinical Social Work: [employer] [phone]

## 2015-09-24 NOTE — Consult Note (Signed)
[facility] Psychiatry Consult   Reason for Consult:  Paranoia and history of breast cancer and chemothrapy Referring Physician:  Dr. Broadus John Patient Identification: Gloria Lang MRN:  [id number] Principal Diagnosis: Other affective psychosis Diagnosis:   Patient Active Problem List   Diagnosis Date Noted  . Acute delirium [R41.0] 09/24/2015  . Acute encephalopathy [G93.40] 09/24/2015  . Leukocytosis [D72.829] 09/24/2015  . Other affective psychosis [F39] 09/24/2015  . GERD (gastroesophageal reflux disease) [K21.9]   . Acute paranoia ([place]) [F22]   . Asthma, mild intermittent [J45.20]   . Breast cancer of upper-outer quadrant of left female breast [facility]) [C50.412] 08/07/2015    Total Time spent with patient: 1 hour  Subjective:   Gloria Lang is a 55 y.o. female patient admitted with Paranoia and bizarre behavior.  HPI:  Gloria Lang is an 55 y.o. Female seen, chart reviewed for face-to-face psychiatric consultation and evaluation of acute psychotic symptoms including paranoid about her food has been poisoned and also running away from friends home and trying to rinse her mouth several times and still feeling burning sensation on her face and throat leads to calling the emergency medical services. Patient seems to have abnormal reaction when she has no taste buds in her mouth secondary to chemotherapy. Patient has been defiant and non-cooperative in the emergency department required intramuscular Ativan and placed in the apical center for observation and further evaluation. Patient reportedly diagnosed with triple negative left breast cancer about 6 weeks ago and supposed to get third chemotherapy today. Patient chemotherapy does not have any neurological reaction or psychotic reactions. Patient presented to the emergency department with the rapid speech, mood swings, tangential thought process and flight of ideas. Patient denies history of mental health illness or inpatient psychiatric  hospitalization prior to this incident. Patient denies active suicidal/homicidal ideation, intention or plans and willing to follow up with the oncologist and also counselor at [facility] as scheduled. Patient requested to go home and also willing to take medication as recommended by the hospital physician. Patient lives with her 2 young granddaughters who are 49 and 61 years old and works as a Quarry manager.   Diagnosis: F29 Unspecified psychosis not due to substance or known physiological condition  Past Psychiatric History: none reported  Risk to Self: Suicidal Ideation: No Suicidal Intent: No Is patient at risk for suicide?: No Suicidal Plan?: No Access to Means:  (UTA due to pt presentation ) What has been your use of drugs/alcohol within the last 12 months?: UTA due to pt presentation -UDS Clear How many times?:  (UTA due to pt presentation ) Other Self Harm Risks:  (UTA due to pt presentation ) Triggers for Past Attempts:  (UTA due to pt presentation ) Intentional Self Injurious Behavior:  (UTA due to pt presentation ) Risk to Others: Thoughts of Harm to Others:  (UTA due to pt presentation ) Current Homicidal Intent:  (UTA due to pt presentation ) Current Homicidal Plan:  (UTA due to pt presentation ) Access to Homicidal Means:  (UTA due to pt presentation ) Identified Victim:  (UTA due to pt presentation ) History of harm to others?:  (UTA due to pt presentation ) Assessment of Violence:  (UTA due to pt presentation ) Violent Behavior Description:  (UTA due to pt presentation ) Does patient have access to weapons?:  (UTA due to pt presentation ) Criminal Charges Pending?:  (UTA due to pt presentation ) Does patient have a court date:  (UTA due to pt presentation )  Prior Inpatient Therapy: Prior Inpatient Therapy:  (UTA due to pt presentation -No psychiatric hx noted in chart) Prior Outpatient Therapy: Prior Outpatient Therapy:  (UTA due to pt presentation-No psychiatric hx noted in  chart) Does patient have an ACCT team?: Unknown Does patient have Intensive In-House Services?  : Unknown Does patient have [facility] services? : Unknown Does patient have P4CC services?: Unknown  Past Medical History:  Past Medical History  Diagnosis Date  . Breast cancer of upper-outer quadrant of left female breast ([place]) 08/07/2015  . Anemia   . Hot flashes   . Family history of adverse reaction to anesthesia     younger sister has same issues with anesthesia  . Complication of anesthesia     patient states she has coded multiple times before with anesthesia. her sister has similar issues.   . Shortness of breath dyspnea     if patient's head is flat (per pt)  . Asthma     only in the summer time- uses albuterol inhaler  . GERD (gastroesophageal reflux disease)   . History of hiatal hernia     had hiatal hernia repair in '94 (per patient)  . Arthritis   . Hx of peptic ulcer   . Hx of transfusion of packed red blood cells   . Sleep apnea     couldn't tolerate CPAP d/t claustrophobia; sleep upright "90 degree angle": study done at [facility] several years ago    Past Surgical History  Procedure Laterality Date  . Abdominal hysterectomy    . Tubal ligation    . Hernia repair    . Colonoscopy    . Upper gi endoscopy    . Portacath placement N/A 08/20/2015    Procedure: INSERTION PORT-A-CATH;  Surgeon: Stark Klein, MD;  Location: [facility] OR;  Service: General;  Laterality: N/A;   Family History:  Family History  Problem Relation Age of Onset  . Colon cancer Brother 52  . Throat cancer Maternal Grandfather   . Lung cancer Maternal Uncle   . Ovarian cancer Sister 45    suspected but not pathologically confirmed  . Lung cancer Paternal Uncle   . Breast cancer Maternal Aunt 60   Family Psychiatric  History: Significant for schizophrenia in her sister who is 97-year-old and on her and lives with her but currently in [place]. Social History:  History  Alcohol Use No      History  Drug Use No    Social History   Social History  . Marital Status: Single    Spouse Name: N/A  . Number of Children: N/A  . Years of Education: N/A   Social History Main Topics  . Smoking status: Never Smoker   . Smokeless tobacco: None  . Alcohol Use: No  . Drug Use: No  . Sexual Activity: Not Asked   Other Topics Concern  . None   Social History Narrative   Additional Social History:    Allergies:  No Known Allergies  Labs:  Results for orders placed or performed during the hospital encounter of 09/24/15 (from the past 48 hour(s))  Comprehensive metabolic panel     Status: Abnormal   Collection Time: 09/24/15  1:52 AM  Result Value Ref Range   Sodium 140 135 - 145 mmol/L   Potassium 4.2 3.5 - 5.1 mmol/L   Chloride 104 101 - 111 mmol/L   CO2 25 22 - 32 mmol/L   Glucose, Bld 150 (H) 65 - 99 mg/dL   BUN  15 6 - 20 mg/dL   Creatinine, Ser 0.79 0.44 - 1.00 mg/dL   Calcium 9.6 8.9 - 10.3 mg/dL   Total Protein 6.9 6.5 - 8.1 g/dL   Albumin 4.5 3.5 - 5.0 g/dL   AST 47 (H) 15 - 41 U/L   ALT 53 14 - 54 U/L   Alkaline Phosphatase 108 38 - 126 U/L   Total Bilirubin 0.4 0.3 - 1.2 mg/dL   GFR calc non Af Amer >60 >60 mL/min   GFR calc Af Amer >60 >60 mL/min    Comment: (NOTE) The eGFR has been calculated using the CKD EPI equation. This calculation has not been validated in all clinical situations. eGFR's persistently <60 mL/min signify possible Chronic Kidney Disease.    Anion gap 11 5 - 15  Ethanol     Status: None   Collection Time: 09/24/15  1:52 AM  Result Value Ref Range   Alcohol, Ethyl (B) <5 <5 mg/dL    Comment:        LOWEST DETECTABLE LIMIT FOR SERUM ALCOHOL IS 5 mg/dL FOR MEDICAL PURPOSES ONLY   Salicylate level     Status: None   Collection Time: 09/24/15  1:52 AM  Result Value Ref Range   Salicylate Lvl <1.5 2.8 - 30.0 mg/dL  Acetaminophen level     Status: Abnormal   Collection Time: 09/24/15  1:52 AM  Result Value Ref Range    Acetaminophen (Tylenol), Serum <10 (L) 10 - 30 ug/mL    Comment:        THERAPEUTIC CONCENTRATIONS VARY SIGNIFICANTLY. A RANGE OF 10-30 ug/mL MAY BE AN EFFECTIVE CONCENTRATION FOR MANY PATIENTS. HOWEVER, SOME ARE BEST TREATED AT CONCENTRATIONS OUTSIDE THIS RANGE. ACETAMINOPHEN CONCENTRATIONS >150 ug/mL AT 4 HOURS AFTER INGESTION AND >50 ug/mL AT 12 HOURS AFTER INGESTION ARE OFTEN ASSOCIATED WITH TOXIC REACTIONS.   cbc     Status: Abnormal   Collection Time: 09/24/15  1:52 AM  Result Value Ref Range   WBC 29.3 (H) 4.0 - 10.5 K/uL   RBC 3.01 (L) 3.87 - 5.11 MIL/uL   Hemoglobin 9.4 (L) 12.0 - 15.0 g/dL   HCT 28.2 (L) 36.0 - 46.0 %   MCV 93.7 78.0 - 100.0 fL   MCH 31.2 26.0 - 34.0 pg   MCHC 33.3 30.0 - 36.0 g/dL   RDW 15.1 11.5 - 15.5 %   Platelets 357 150 - 400 K/uL  Rapid urine drug screen (hospital performed)     Status: None   Collection Time: 09/24/15  1:58 AM  Result Value Ref Range   Opiates NONE DETECTED NONE DETECTED   Cocaine NONE DETECTED NONE DETECTED   Benzodiazepines NONE DETECTED NONE DETECTED   Amphetamines NONE DETECTED NONE DETECTED   Tetrahydrocannabinol NONE DETECTED NONE DETECTED   Barbiturates NONE DETECTED NONE DETECTED    Comment:        DRUG SCREEN FOR MEDICAL PURPOSES ONLY.  IF CONFIRMATION IS NEEDED FOR ANY PURPOSE, NOTIFY LAB WITHIN 5 DAYS.        LOWEST DETECTABLE LIMITS FOR URINE DRUG SCREEN Drug Class       Cutoff (ng/mL) Amphetamine      1000 Barbiturate      200 Benzodiazepine   830 Tricyclics       940 Opiates          300 Cocaine          300 THC              50  Lactic acid, plasma     Status: None   Collection Time: 09/24/15  8:45 AM  Result Value Ref Range   Lactic Acid, Venous 1.0 0.5 - 2.0 mmol/L  Procalcitonin     Status: None   Collection Time: 09/24/15  8:45 AM  Result Value Ref Range   Procalcitonin 0.19 ng/mL    Comment:        Interpretation: PCT (Procalcitonin) <= 0.5 ng/mL: Systemic infection (sepsis) is not  likely. Local bacterial infection is possible. (NOTE)         ICU PCT Algorithm               Non ICU PCT Algorithm    ----------------------------     ------------------------------         PCT < 0.25 ng/mL                 PCT < 0.1 ng/mL     Stopping of antibiotics            Stopping of antibiotics       strongly encouraged.               strongly encouraged.    ----------------------------     ------------------------------       PCT level decrease by               PCT < 0.25 ng/mL       >= 80% from peak PCT       OR PCT 0.25 - 0.5 ng/mL          Stopping of antibiotics                                             encouraged.     Stopping of antibiotics           encouraged.    ----------------------------     ------------------------------       PCT level decrease by              PCT >= 0.25 ng/mL       < 80% from peak PCT        AND PCT >= 0.5 ng/mL            Continuin g antibiotics                                              encouraged.       Continuing antibiotics            encouraged.    ----------------------------     ------------------------------     PCT level increase compared          PCT > 0.5 ng/mL         with peak PCT AND          PCT >= 0.5 ng/mL             Escalation of antibiotics                                          strongly encouraged.      Escalation of antibiotics  strongly encouraged.   Protime-INR     Status: None   Collection Time: 09/24/15  8:45 AM  Result Value Ref Range   Prothrombin Time 13.6 11.6 - 15.2 seconds   INR 1.02 0.00 - 1.49  APTT     Status: None   Collection Time: 09/24/15  8:45 AM  Result Value Ref Range   aPTT 26 24 - 37 seconds    Current Facility-Administered Medications  Medication Dose Route Frequency Provider Last Rate Last Dose  . 0.9 %  sodium chloride infusion   Intravenous Continuous Ivor Costa, MD      . acetaminophen (TYLENOL) tablet 650 mg  650 mg Oral Q6H PRN Ivor Costa, MD       Or  . acetaminophen  (TYLENOL) suppository 650 mg  650 mg Rectal Q6H PRN Ivor Costa, MD      . enoxaparin (LOVENOX) injection 40 mg  40 mg Subcutaneous Q24H Ivor Costa, MD      . fluconazole (DIFLUCAN) tablet 100 mg  100 mg Oral Daily Ivor Costa, MD      . lidocaine-prilocaine (EMLA) cream   Topical BID PRN Ivor Costa, MD      . LORazepam (ATIVAN) injection 1 mg  1 mg Intramuscular Q4H PRN Ivor Costa, MD   1 mg at 09/24/15 0383  . multivitamin with minerals tablet 1 tablet  1 tablet Oral Daily Ivor Costa, MD      . ondansetron [facility]) injection 4 mg  4 mg Intravenous Q8H PRN Ivor Costa, MD      . sodium chloride 0.9 % bolus 2,000 mL  2,000 mL Intravenous Once Ivor Costa, MD      . sodium chloride flush (NS) 0.9 % injection 10-40 mL  10-40 mL Intracatheter PRN Domenic Polite, MD      . sodium chloride flush (NS) 0.9 % injection 3 mL  3 mL Intravenous Q12H Ivor Costa, MD      . valACYclovir (VALTREX) tablet 500 mg  500 mg Oral Daily Ivor Costa, MD      . zolpidem (AMBIEN) tablet 5 mg  5 mg Oral QHS PRN Ivor Costa, MD        Musculoskeletal: Strength & Muscle Tone: within normal limits Gait & Station: normal Patient leans: N/A  Psychiatric Specialty Exam: ROS  No Fever-chills, No Headache, No changes with Vision or hearing, reports vertigo No problems swallowing food or Liquids, No Chest pain, Cough or Shortness of Breath, No Abdominal pain, No Nausea or Vommitting, Bowel movements are regular, No Blood in stool or Urine, No dysuria, No new skin rashes or bruises, No new joints pains-aches,  No new weakness, tingling, numbness in any extremity, No recent weight gain or loss, No polyuria, polydypsia or polyphagia,   A full 10 point Review of Systems was done, except as stated above, all other Review of Systems were negative.  Blood pressure 157/84, pulse 101, temperature 98.6 F (37 C), temperature source Oral, resp. rate 20, height 5' (1.524 m), SpO2 100 %.There is no weight on file to calculate BMI.  General  Appearance: Casual  Eye Contact::  Good  Speech:  Clear and Coherent  Volume:  Normal  Mood:  Anxious and Depressed  Affect:  Labile  Thought Process:  Coherent and Goal Directed  Orientation:  Full (Time, Place, and Person)  Thought Content:  WDL  Suicidal Thoughts:  No  Homicidal Thoughts:  No  Memory:  Immediate;   Good Recent;   Good  Judgement:  Fair  Insight:  Fair  Psychomotor Activity:  Increased  Concentration:  Good  Recall:  Good  Fund of Knowledge:Good  Language: Good  Akathisia:  Negative  Handed:  Right  AIMS (if indicated):     Assets:  Communication Skills Desire for Improvement Financial Resources/Insurance Housing Leisure Time Resilience Social Support Talents/Skills Transportation Vocational/Educational  ADL's:  Intact  Cognition: WNL  Sleep:      Treatment Plan Summary: Patient is anxious, Has mild mood swings and paranoia probably secondary to stress secondary to chemotherapy for breast cancer and loss of taste buds in her mouth. Patient reaction is somewhat abnormal but able to calm down and actively participate in this evaluation.  Rescind involuntary commitment and discontinue safety sitter  Patient will be cooperative to participate in medication management and also chemotherapy as scheduled along with therapist at [facility]  Will recommend Seroquel 25 mg 2 times daily as needed for anxiety, mood swings and paranoia  Patient does not meet criteria for acute psychiatric hospitalization.  Appreciate psychiatric consultation and we sign off as of today Please contact [phone] or [phone] if needs further assistance    Disposition: Patient does not meet criteria for psychiatric inpatient admission. Supportive therapy provided about ongoing stressors.  Durward Parcel., MD 09/24/2015 11:21 AM

## 2015-09-25 ENCOUNTER — Ambulatory Visit (HOSPITAL_BASED_OUTPATIENT_CLINIC_OR_DEPARTMENT_OTHER): Payer: BLUE CROSS/BLUE SHIELD

## 2015-09-25 ENCOUNTER — Encounter: Payer: Self-pay | Admitting: *Deleted

## 2015-09-25 ENCOUNTER — Encounter: Payer: Self-pay | Admitting: Nurse Practitioner

## 2015-09-25 ENCOUNTER — Ambulatory Visit (HOSPITAL_BASED_OUTPATIENT_CLINIC_OR_DEPARTMENT_OTHER): Payer: BLUE CROSS/BLUE SHIELD | Admitting: Nurse Practitioner

## 2015-09-25 ENCOUNTER — Other Ambulatory Visit: Payer: Self-pay | Admitting: Nurse Practitioner

## 2015-09-25 VITALS — BP 154/85 | HR 100 | Temp 98.5°F | Resp 20 | Ht 60.0 in | Wt 147.1 lb

## 2015-09-25 DIAGNOSIS — Z170 Estrogen receptor positive status [ER+]: Secondary | ICD-10-CM

## 2015-09-25 DIAGNOSIS — Z5111 Encounter for antineoplastic chemotherapy: Secondary | ICD-10-CM | POA: Diagnosis not present

## 2015-09-25 DIAGNOSIS — C773 Secondary and unspecified malignant neoplasm of axilla and upper limb lymph nodes: Secondary | ICD-10-CM

## 2015-09-25 DIAGNOSIS — C50412 Malignant neoplasm of upper-outer quadrant of left female breast: Secondary | ICD-10-CM

## 2015-09-25 DIAGNOSIS — R070 Pain in throat: Secondary | ICD-10-CM | POA: Diagnosis not present

## 2015-09-25 DIAGNOSIS — R197 Diarrhea, unspecified: Secondary | ICD-10-CM

## 2015-09-25 DIAGNOSIS — F22 Delusional disorders: Secondary | ICD-10-CM

## 2015-09-25 DIAGNOSIS — K219 Gastro-esophageal reflux disease without esophagitis: Secondary | ICD-10-CM

## 2015-09-25 MED ORDER — PALONOSETRON HCL INJECTION 0.25 MG/5ML
0.2500 mg | Freq: Once | INTRAVENOUS | Status: AC
  Administered 2015-09-25: 0.25 mg via INTRAVENOUS

## 2015-09-25 MED ORDER — SODIUM CHLORIDE 0.9 % IV SOLN
Freq: Once | INTRAVENOUS | Status: AC
  Administered 2015-09-25: 13:00:00 via INTRAVENOUS
  Filled 2015-09-25: qty 5

## 2015-09-25 MED ORDER — SODIUM CHLORIDE 0.9 % IV SOLN
600.0000 mg/m2 | Freq: Once | INTRAVENOUS | Status: AC
  Administered 2015-09-25: 1000 mg via INTRAVENOUS
  Filled 2015-09-25: qty 50

## 2015-09-25 MED ORDER — PEGFILGRASTIM 6 MG/0.6ML ~~LOC~~ PSKT
6.0000 mg | PREFILLED_SYRINGE | Freq: Once | SUBCUTANEOUS | Status: AC
  Administered 2015-09-25: 6 mg via SUBCUTANEOUS
  Filled 2015-09-25: qty 0.6

## 2015-09-25 MED ORDER — HEPARIN SOD (PORK) LOCK FLUSH 100 UNIT/ML IV SOLN
500.0000 [IU] | Freq: Once | INTRAVENOUS | Status: AC | PRN
  Administered 2015-09-25: 500 [IU]
  Filled 2015-09-25: qty 5

## 2015-09-25 MED ORDER — DOXORUBICIN HCL CHEMO IV INJECTION 2 MG/ML
60.0000 mg/m2 | Freq: Once | INTRAVENOUS | Status: AC
Start: 2015-09-25 — End: 2015-09-25
  Administered 2015-09-25: 100 mg via INTRAVENOUS
  Filled 2015-09-25: qty 50

## 2015-09-25 MED ORDER — SODIUM CHLORIDE 0.9% FLUSH
10.0000 mL | INTRAVENOUS | Status: DC | PRN
  Administered 2015-09-25: 10 mL
  Filled 2015-09-25: qty 10

## 2015-09-25 MED ORDER — PALONOSETRON HCL INJECTION 0.25 MG/5ML
INTRAVENOUS | Status: AC
  Filled 2015-09-25: qty 5

## 2015-09-25 MED ORDER — SODIUM CHLORIDE 0.9 % IV SOLN
Freq: Once | INTRAVENOUS | Status: AC
  Administered 2015-09-25: 13:00:00 via INTRAVENOUS

## 2015-09-25 NOTE — Progress Notes (Signed)
Pt c/o burning sinuses but refuses to let this RN decrease rate of infusion.

## 2015-09-25 NOTE — Progress Notes (Signed)
This Probation officer is reviewing this chart for psych consult.    Chesley Noon, MSW, Darlyn Read [facility] Triage Specialist [phone]

## 2015-09-25 NOTE — Progress Notes (Signed)
[place]  Telephone:([phone]) [phone] Fax:(336) [phone]     ID: Gloria Lang DOB: 19-Oct-1960  MR#: [id number]  FVC#:[id number]  Patient Care Team: Everardo Beals, NP as PCP - General Chauncey Cruel, MD as Consulting Physician (Oncology) Kyung Rudd, MD as Consulting Physician (Radiation Oncology) Stark Klein, MD as Consulting Physician (General Surgery) PCP: Imelda Pillow, NP GYN: OTHER MD:  CHIEF COMPLAINT: Triple negative breast cancer  CURRENT TREATMENT: Neoadjuvant chemotherapy  BREAST CANCER HISTORY: From the original intake note:  Gloria Lang started experiencing pain in her left breast about a month prior to bringing it to medical attention. The pain would, go but overall it tended to be getting worse. There was no obvious mass or discharge. She finally brought it to [employer] attention in Dr. Ellie Lunch office and he was able to palpate a mass. He set the patient up for bilateral diagnostic mammography with tomography and left breast ultrasonography at the [facility] 07/30/2015. The breast density was category D. In the upper-outer quadrant of the left breast there was a spiculated mass which was palpable at the 1:30 o'clock location 10 cm from the nipple. There was no palpable axillary adenopathy. Ultrasound confirmed an irregular hypoechoic mass in the area just mentioned measuring 2.4 cm. The left axilla showed 2 adjacent lymph nodes with cortical thickening.  On 08/05/2015 [name] underwent biopsy of the left breast mass and also of one of the left axillary lymph nodes in question. The final pathology ([facility] [id number]) showed both biopsies to be involved by invasive mammary carcinoma, which proved to be E-cadherin positive and therefore consistent with a ductal phenotype. Prognostic panels were run on both biopsies. Both were estrogen and progesterone receptor negative. The MIB-1 of the breast mass was 10% but that of the lymph node mass was 60%. HER-2 was  negative and both cases, with a signals ratio is between 1.63 and 1.71, and number per cell between 1.95 and 2.05.  Her subsequent history is as detailed below  INTERVAL HISTORY: Gloria Lang returns for follow up of her left-sided breast cancer, alone. Today is day 1, cycle 3 of 4 planned cycles of doxorubicin and cyclophosphamide, with neulasta onpro given on day 2 for granulocyte support. The interval history is remarkable for a 16 hour involuntary commitment at [facility] after patient presented to ED with paranoid behavior. She reported being poisoned by neighbors, both from food and "toxic" chemicals into her air vent. She was verbally abusive with staff, refused labs and food. She was given one dose of IM ativan (against her will, per patient) and she is still upset about it today. I visited her yesterday and she is essentially in the same metal state today. She is anxious, paranoid, speaking rapidly, with a flight of ideas that do not always connect to the previous statement. She will answer questions, but quickly changes the subject. She refuses any anti-psychotic meds, insisting that she is fine and a "[place] woman." She was discharged with a prescription for seroquel, but she refuses to fill it. The cause of her behavior has been suggested to be related to recent steroid use for chemotherapy. She is well groomed and polite today. Despite her speech, she is alert and oriented x 4. She just wants to restart treatment as planned.   REVIEW OF SYSTEMS: Gloria Lang is having a few episodes of diarrhea daily, both before and after her admission to the hospital. She is not interested in imodium use. Her main complaint today is a sore  throat that keeps her from swallowing well. She feels like food keeps getting stuck halfway down her esophagus. She denies fevers or chills. She has some mild nausea, and has not eaten much since yesterday, but she "knows what to do for this." She did not sleep "at all" last  night. She denies mouth sores, rashes or neuropathy symptoms. She is in no pain. She denies headaches, dizziness, or vision changes. A detailed review of systems is otherwise stable.  PAST MEDICAL HISTORY: Past Medical History  Diagnosis Date  . Breast cancer of upper-outer quadrant of left female breast ([place]) 08/07/2015  . Anemia   . Hot flashes   . Family history of adverse reaction to anesthesia     younger sister has same issues with anesthesia  . Complication of anesthesia     patient states she has coded multiple times before with anesthesia. her sister has similar issues.   . Shortness of breath dyspnea     if patient's head is flat (per pt)  . Asthma     only in the summer time- uses albuterol inhaler  . GERD (gastroesophageal reflux disease)   . History of hiatal hernia     had hiatal hernia repair in '94 (per patient)  . Arthritis   . Hx of peptic ulcer   . Hx of transfusion of packed red blood cells   . Sleep apnea     couldn't tolerate CPAP d/t claustrophobia; sleep upright "90 degree angle": study done at [facility] several years ago    PAST SURGICAL HISTORY: Past Surgical History  Procedure Laterality Date  . Abdominal hysterectomy    . Tubal ligation    . Hernia repair    . Colonoscopy    . Upper gi endoscopy    . Portacath placement N/A 08/20/2015    Procedure: INSERTION PORT-A-CATH;  Surgeon: Stark Klein, MD;  Location: [facility] OR;  Service: General;  Laterality: N/A;    FAMILY HISTORY Family History  Problem Relation Age of Onset  . Colon cancer Brother 16  . Throat cancer Maternal Grandfather   . Lung cancer Maternal Uncle   . Ovarian cancer Sister 30    suspected but not pathologically confirmed  . Lung cancer Paternal Uncle   . Breast cancer Maternal Aunt 16  The patient's father died at age 37, from causes not related to cancer. The patient's mother is age 53 as of April 2017. The patient has 2 brothers, 3 sisters. The patient has 1 aunt  diagnosed with breast cancer in her 30s, a paternal grandfather and an uncle with throat cancer, a brother with colon cancer diagnosed in his 21s, and an uncle on the paternal side with lung cancer.   GYNECOLOGIC HISTORY:  No LMP recorded. Patient has had a hysterectomy.  Menarche age 16, first live birth age 80. She is GX P2. The patient underwent total abdominal hysterectomy with bilateral salpingo-oophorectomy for fibroids. However she tells me evaluation 2 years after that surgery showed that she still had a uterus and ovaries. She never called the surgeon ( in [place]) back to discuss this because she says he has retired.   SOCIAL HISTORY:  Ramsha works as a Quarry manager. At home she lives with her sister Shelton Silvas and 2 grandchildren, aged 18 and 73. The patient's children are  [employer], lives in [place] and works as a Librarian, academic in a plant,  and e [place], lives in [place] and is disabled. The patient has a total of  13 grandchildren and one great-grandchild. She attends a local [employer]:  not in place    HEALTH MAINTENANCE: Social History  Substance Use Topics  . Smoking status: Never Smoker   . Smokeless tobacco: Not on file  . Alcohol Use: No     Colonoscopy: 2006?  PAP: 2014  Bone density:  Lipid panel:  No Known Allergies  Current Outpatient Prescriptions  Medication Sig Dispense Refill  . dexamethasone (DECADRON) 4 MG tablet Take 2 tablets by mouth once a day on the day after chemotherapy and then take 2 tablets two times a day for 2 days. Take with food. 30 tablet 1  . diphenhydrAMINE (BENADRYL) 25 mg capsule Take 25 mg by mouth at bedtime.    . fluconazole (DIFLUCAN) 100 MG tablet Take 1 tablet (100 mg total) by mouth daily. 7 tablet 0  . lidocaine-prilocaine (EMLA) cream Apply to affected area once 30 g 3  . loratadine (CLARITIN) 10 MG tablet Take 1 tablet (10 mg total) by mouth daily. 30 tablet 1  . Multiple  Vitamin (MULTIVITAMIN WITH MINERALS) TABS tablet Take 1 tablet by mouth daily.    . ondansetron (ZOFRAN) 8 MG tablet Take 1 tablet (8 mg total) by mouth 2 (two) times daily as needed. Start on the third day after chemotherapy. 30 tablet 1  . valACYclovir (VALTREX) 500 MG tablet Take 1 tablet (500 mg total) by mouth daily. 20 tablet 0  . zolpidem (AMBIEN) 5 MG tablet Take 1-2 tablets (5-10 mg total) by mouth at bedtime as needed for sleep. 30 tablet 0   No current facility-administered medications for this visit.   Facility-Administered Medications Ordered in Other Visits  Medication Dose Route Frequency Provider Last Rate Last Dose  . 0.9 %  sodium chloride infusion   Intravenous Once Nicholas Lose, MD      . cyclophosphamide (CYTOXAN) 1,000 mg in sodium chloride 0.9 % 250 mL chemo infusion  600 mg/m2 (Treatment Plan Actual) Intravenous Once Nicholas Lose, MD      . DOXOrubicin (ADRIAMYCIN) chemo injection 100 mg  60 mg/m2 (Treatment Plan Actual) Intravenous Once Nicholas Lose, MD      . fosaprepitant (EMEND) 150 mg in sodium chloride 0.9 % 145 mL IVPB   Intravenous Once Nicholas Lose, MD      . heparin lock flush 100 unit/mL  500 Units Intracatheter Once PRN Nicholas Lose, MD      . palonosetron (ALOXI) injection 0.25 mg  0.25 mg Intravenous Once Nicholas Lose, MD      . pegfilgrastim (NEULASTA ONPRO KIT) injection 6 mg  6 mg Subcutaneous Once Nicholas Lose, MD      . sodium chloride flush (NS) 0.9 % injection 10 mL  10 mL Intracatheter PRN Nicholas Lose, MD        OBJECTIVE: Middle-aged African-American woman in no acute distress  Filed Vitals:   09/25/15 1205  BP: 154/85  Pulse: 100  Temp: 98.5 F (36.9 C)  Resp: 20     Body mass index is 28.73 kg/(m^2).    ECOG FS:0 - Asymptomatic   Sclerae unicteric, pupils round and equal Oropharynx clear and moist-- no thrush or other lesions No cervical or supraclavicular adenopathy Lungs no rales or rhonchi Heart regular rate and rhythm Abd soft,  nontender, positive bowel sounds MSK no focal spinal tenderness, no upper extremity lymphedema Neuro: nonfocal, agitated affect, rapid and tangential speech, flight of ideas, oriented x4 Breasts:   LAB RESULTS:  CMP     Component Value Date/Time   NA 140 09/24/2015 0152   NA 137 09/10/2015 0950   K 4.2 09/24/2015 0152   K 4.4 09/10/2015 0950   CL 104 09/24/2015 0152   CO2 25 09/24/2015 0152   CO2 27 09/10/2015 0950   GLUCOSE 150* 09/24/2015 0152   GLUCOSE 127 09/10/2015 0950   BUN 15 09/24/2015 0152   BUN 12.3 09/10/2015 0950   CREATININE 0.79 09/24/2015 0152   CREATININE 0.9 09/10/2015 0950   CALCIUM 9.6 09/24/2015 0152   CALCIUM 9.8 09/10/2015 0950   PROT 6.9 09/24/2015 0152   PROT 6.9 09/10/2015 0950   ALBUMIN 4.5 09/24/2015 0152   ALBUMIN 4.1 09/10/2015 0950   AST 47* 09/24/2015 0152   AST 24 09/10/2015 0950   ALT 53 09/24/2015 0152   ALT 28 09/10/2015 0950   ALKPHOS 108 09/24/2015 0152   ALKPHOS 125 09/10/2015 0950   BILITOT 0.4 09/24/2015 0152   BILITOT <0.30 09/10/2015 0950   GFRNONAA >60 09/24/2015 0152   GFRAA >60 09/24/2015 0152    INo results found for: SPEP, UPEP  Lab Results  Component Value Date   WBC 29.3* 09/24/2015   NEUTROABS 35.6* 09/10/2015   HGB 9.4* 09/24/2015   HCT 28.2* 09/24/2015   MCV 93.7 09/24/2015   PLT 357 09/24/2015      Chemistry      Component Value Date/Time   NA 140 09/24/2015 0152   NA 137 09/10/2015 0950   K 4.2 09/24/2015 0152   K 4.4 09/10/2015 0950   CL 104 09/24/2015 0152   CO2 25 09/24/2015 0152   CO2 27 09/10/2015 0950   BUN 15 09/24/2015 0152   BUN 12.3 09/10/2015 0950   CREATININE 0.79 09/24/2015 0152   CREATININE 0.9 09/10/2015 0950      Component Value Date/Time   CALCIUM 9.6 09/24/2015 0152   CALCIUM 9.8 09/10/2015 0950   ALKPHOS 108 09/24/2015 0152   ALKPHOS 125 09/10/2015 0950   AST 47* 09/24/2015 0152   AST 24 09/10/2015 0950   ALT 53 09/24/2015 0152   ALT 28 09/10/2015 0950   BILITOT 0.4  09/24/2015 0152   BILITOT <0.30 09/10/2015 0950       No results found for: LABCA2  No components found for: ERDEY814   Recent Labs Lab 09/24/15 0845  INR 1.02    Urinalysis No results found for: COLORURINE, APPEARANCEUR, LABSPEC, PHURINE, GLUCOSEU, HGBUR, BILIRUBINUR, KETONESUR, PROTEINUR, UROBILINOGEN, NITRITE, LEUKOCYTESUR   ELIGIBLE FOR AVAILABLE RESEARCH PROTOCOL: B 51, [facility]  STUDIES: Ct Head W Wo Contrast  09/24/2015  CLINICAL DATA:  Initial evaluation for acute altered mental status. EXAM: CT HEAD WITHOUT AND WITH CONTRAST TECHNIQUE: Contiguous axial images were obtained from the base of the skull through the vertex without and with intravenous contrast CONTRAST:  80m ISOVUE-300 IOPAMIDOL (ISOVUE-300) INJECTION 61% COMPARISON:  None. FINDINGS: There is no acute intracranial hemorrhage or infarct. No mass lesion or midline shift. Gray-white matter differentiation is well maintained. Ventricles are normal in size without evidence of hydrocephalus. CSF containing spaces are within normal limits. No extra-axial fluid collection. No abnormal enhancement. The calvarium is intact. Orbital soft tissues are within normal limits. The paranasal sinuses and mastoid air cells are well pneumatized and free of fluid. Scalp soft tissues are unremarkable. IMPRESSION: Normal contrast enhanced CT of the head. No acute intracranial process identified. Electronically Signed   By: BJeannine BogaM.D.   On: 09/24/2015 04:22   Nm Bone Scan Whole Body  08/26/2015  CLINICAL DATA:  New diagnosis of left breast carcinoma EXAM: NUCLEAR MEDICINE WHOLE BODY BONE SCAN TECHNIQUE: Whole body anterior and posterior images were obtained approximately 3 hours after intravenous injection of radiopharmaceutical. RADIOPHARMACEUTICALS:  26.0 mCi Technetium-35mMDP IV COMPARISON:  CT chest abdomen pelvis of 08/26/2015 FINDINGS: The patient was injected with 297.9millicuries of technetium 975M MDP intravenously  and total body bone scan was performed. Minimal activity is noted in the lower cervical spine. Also there is faint activity in the region of L2 and L3 vertebral bodies. This activity most likely is due to degenerative change with no lytic or blastic lesion noted on bone window images from today's CT. Mild activity is also noted in the left femoral greater trochanter and no lytic or blastic lesion is noted at that site on today's CT scan. IMPRESSION: 1. No definite bone metastasis are noted. 2. Foci of of increased activity in the lower cervical spine, mid upper lumbar spine, and left femoral greater trochanter most likely all are degenerative in origin with no lytic or blastic lesion demonstrated on bone window images from today's CT at those sites. Electronically Signed   By: PIvar DrapeM.D.   On: 08/26/2015 13:29    ASSESSMENT: 55y.o. [place] woman status post left breast upper outer quadrant and left axillary lymph node biopsy 08/05/2015, both positive for an invasive ductal carcinoma, grade 2 or 3, triple negative, with an MIB-1 between 10 and 60%   (1) neoadjuvant chemotherapy consisting of doxorubicin and cyclophosphamide in dose dense fashion 4 started 08/27/2015, to be followed by weekly paclitaxel and carboplatin 12  (2) definitive surgery we will follow chemotherapy  (3) adjuvant radiation to follow as appropriate  (4) genetics testing scheduled for 09/17/2015  PLAN: Physically, CZamayais relatively stable. She is having recurrent episodes of diarrhea, and has refused to take imodium. I asked her to stay well hydrated at least. We are going to try prilosec 455mfor her throat pain, that is probably actually reflux. The labs from her admission yesterday are stable. She will proceed with cycle 3 of cyclophosphamide and doxorubicin as planned.  Mentally, I cannot say that she is any better than she appeared yesterday morning. We discussed the prescription for seroquel that she was  discharged home with, and she refuses. I called to inform GrLoren Racerour social worker, regarding this case. She is going to visit the patient in the treatment room. I have told the patient on multiple occasions during our visit to discontinue use of dexamethasone at home, and I have removed the steroid from her premed list for chemo today in case this was a contributing factor to her mental state.   CaTangeeill return in 1 week for labs and a nadir visit. She understands and agrees with this plan. She knows the goal of treatment in her case is cure. She has been encouraged to call with any issues that might arise before her next visit here.  HeLaurie PandaNP   09/25/2015 12:53 PM

## 2015-09-25 NOTE — Progress Notes (Signed)
[facility]  Clinical Social Work was referred by Futures trader for assessment of psychosocial needs due to recent IVC for delusions and paranoia.  Clinical Social Worker met with patient at [facility] in the infusion room. Pt upset at recent admission and IM Ativan given at hospital. Pt able to carry on a conversation but became loud and disruptive. CSW attempted to calm pt. Pt did express she was glad to be getting her treatment today and "plans to beat her cancer". Pt denied concerns other than care at hospital being "bad" and that they made her promise "to take crazy people medicine". Pt states she will not take medicine that she is not supposed to. CSW Did not continue discussion as pt continued to get louder and louder. CSW feels provider and pt need to be on the same page that in order to get proper cancer care pt must get adequate mental health care as well. Pt does not appear to be in danger of harming self or others, appears competent to make own decisions. Pt would benefit from further MH supports. CSW will follow up at a later time as infusion room was not best option to discuss these matters.   Clinical Social Work interventions: Assess MH status Loren Racer, LCSW Clinical Social Worker [place]  [place] Phone: [phone] Fax: [phone]

## 2015-09-25 NOTE — Patient Instructions (Signed)
[facility] Discharge Instructions for Patients Receiving Chemotherapy  Today you received the following chemotherapy agents: Cytoxan, Adriamycin.  To help prevent nausea and vomiting after your treatment, we encourage you to take your nausea medication: Zofran 8mg  twice a day as needed starting 09/28/15.   If you develop nausea and vomiting that is not controlled by your nausea medication, call the clinic.   BELOW ARE SYMPTOMS THAT SHOULD BE REPORTED IMMEDIATELY:  *FEVER GREATER THAN 100.5 F  *CHILLS WITH OR WITHOUT FEVER  NAUSEA AND VOMITING THAT IS NOT CONTROLLED WITH YOUR NAUSEA MEDICATION  *UNUSUAL SHORTNESS OF BREATH  *UNUSUAL BRUISING OR BLEEDING  TENDERNESS IN MOUTH AND THROAT WITH OR WITHOUT PRESENCE OF ULCERS  *URINARY PROBLEMS  *BOWEL PROBLEMS  UNUSUAL RASH Items with * indicate a potential emergency and should be followed up as soon as possible.  Feel free to call the clinic you have any questions or concerns. The clinic phone number is [phone].  Please show the [facility] at check-in to the Emergency Department and triage nurse.

## 2015-09-27 ENCOUNTER — Emergency Department (HOSPITAL_COMMUNITY): Payer: BLUE CROSS/BLUE SHIELD

## 2015-09-27 ENCOUNTER — Emergency Department (HOSPITAL_COMMUNITY)
Admission: EM | Admit: 2015-09-27 | Discharge: 2015-09-30 | Disposition: A | Discharge status: Home / Self Care | Payer: BLUE CROSS/BLUE SHIELD | Attending: Emergency Medicine | Admitting: Emergency Medicine

## 2015-09-27 ENCOUNTER — Encounter (HOSPITAL_COMMUNITY): Payer: Self-pay | Admitting: Emergency Medicine

## 2015-09-27 ENCOUNTER — Other Ambulatory Visit: Payer: Self-pay

## 2015-09-27 DIAGNOSIS — M1990 Unspecified osteoarthritis, unspecified site: Secondary | ICD-10-CM | POA: Insufficient documentation

## 2015-09-27 DIAGNOSIS — R456 Violent behavior: Secondary | ICD-10-CM | POA: Diagnosis not present

## 2015-09-27 DIAGNOSIS — IMO0002 Reserved for concepts with insufficient information to code with codable children: Secondary | ICD-10-CM | POA: Diagnosis present

## 2015-09-27 DIAGNOSIS — Z79899 Other long term (current) drug therapy: Secondary | ICD-10-CM | POA: Diagnosis not present

## 2015-09-27 DIAGNOSIS — J45909 Unspecified asthma, uncomplicated: Secondary | ICD-10-CM | POA: Diagnosis not present

## 2015-09-27 DIAGNOSIS — F918 Other conduct disorders: Secondary | ICD-10-CM | POA: Diagnosis present

## 2015-09-27 DIAGNOSIS — Z853 Personal history of malignant neoplasm of breast: Secondary | ICD-10-CM | POA: Insufficient documentation

## 2015-09-27 DIAGNOSIS — F22 Delusional disorders: Secondary | ICD-10-CM | POA: Diagnosis not present

## 2015-09-27 LAB — CBC WITH DIFFERENTIAL/PLATELET
BASOS PCT: 0 %
Basophils Absolute: 0 10*3/uL (ref 0.0–0.1)
EOS PCT: 0 %
Eosinophils Absolute: 0 10*3/uL (ref 0.0–0.7)
HEMATOCRIT: 27.7 % — AB (ref 36.0–46.0)
Hemoglobin: 9.5 g/dL — ABNORMAL LOW (ref 12.0–15.0)
LYMPHS ABS: 1.6 10*3/uL (ref 0.7–4.0)
Lymphocytes Relative: 2 %
MCH: 31.4 pg (ref 26.0–34.0)
MCHC: 34.3 g/dL (ref 30.0–36.0)
MCV: 91.4 fL (ref 78.0–100.0)
MONO ABS: 1.6 10*3/uL — AB (ref 0.1–1.0)
MONOS PCT: 2 %
Neutro Abs: 78.6 10*3/uL — ABNORMAL HIGH (ref 1.7–7.7)
Neutrophils Relative %: 96 %
PLATELETS: 399 10*3/uL (ref 150–400)
RBC: 3.03 MIL/uL — ABNORMAL LOW (ref 3.87–5.11)
RDW: 15.4 % (ref 11.5–15.5)
WBC Morphology: INCREASED
WBC: 81.8 10*3/uL (ref 4.0–10.5)

## 2015-09-27 LAB — COMPREHENSIVE METABOLIC PANEL
ALT: 48 U/L (ref 14–54)
AST: 42 U/L — AB (ref 15–41)
Albumin: 4.4 g/dL (ref 3.5–5.0)
Alkaline Phosphatase: 127 U/L — ABNORMAL HIGH (ref 38–126)
Anion gap: 9 (ref 5–15)
BILIRUBIN TOTAL: 0.6 mg/dL (ref 0.3–1.2)
BUN: 11 mg/dL (ref 6–20)
CHLORIDE: 104 mmol/L (ref 101–111)
CO2: 23 mmol/L (ref 22–32)
CREATININE: 0.75 mg/dL (ref 0.44–1.00)
Calcium: 9.4 mg/dL (ref 8.9–10.3)
Glucose, Bld: 112 mg/dL — ABNORMAL HIGH (ref 65–99)
POTASSIUM: 3.6 mmol/L (ref 3.5–5.1)
Sodium: 136 mmol/L (ref 135–145)
TOTAL PROTEIN: 6.7 g/dL (ref 6.5–8.1)

## 2015-09-27 LAB — URINALYSIS, ROUTINE W REFLEX MICROSCOPIC
BILIRUBIN URINE: NEGATIVE
GLUCOSE, UA: NEGATIVE mg/dL
Hgb urine dipstick: NEGATIVE
KETONES UR: NEGATIVE mg/dL
LEUKOCYTES UA: NEGATIVE
Nitrite: NEGATIVE
PH: 6 (ref 5.0–8.0)
PROTEIN: NEGATIVE mg/dL
Specific Gravity, Urine: 1.005 (ref 1.005–1.030)

## 2015-09-27 LAB — RAPID URINE DRUG SCREEN, HOSP PERFORMED
Amphetamines: NOT DETECTED
BARBITURATES: NOT DETECTED
Benzodiazepines: NOT DETECTED
Cocaine: NOT DETECTED
Opiates: NOT DETECTED
Tetrahydrocannabinol: NOT DETECTED

## 2015-09-27 LAB — ACETAMINOPHEN LEVEL

## 2015-09-27 LAB — MAGNESIUM: MAGNESIUM: 2 mg/dL (ref 1.7–2.4)

## 2015-09-27 LAB — SALICYLATE LEVEL

## 2015-09-27 LAB — PHOSPHORUS: PHOSPHORUS: 3.4 mg/dL (ref 2.5–4.6)

## 2015-09-27 LAB — ETHANOL

## 2015-09-27 MED ORDER — LORAZEPAM 2 MG/ML IJ SOLN
INTRAMUSCULAR | Status: AC
  Filled 2015-09-27: qty 1

## 2015-09-27 MED ORDER — VALACYCLOVIR HCL 500 MG PO TABS
500.0000 mg | ORAL_TABLET | Freq: Every day | ORAL | Status: DC
  Administered 2015-09-28 – 2015-09-30 (×3): 500 mg via ORAL
  Filled 2015-09-27 (×3): qty 1

## 2015-09-27 MED ORDER — ACETAMINOPHEN 325 MG PO TABS: 650.0000 mg | ORAL_TABLET | ORAL | Status: DC | PRN

## 2015-09-27 MED ORDER — STERILE WATER FOR INJECTION IJ SOLN
INTRAMUSCULAR | Status: AC
  Administered 2015-09-27: 1.2 mL
  Filled 2015-09-27: qty 10

## 2015-09-27 MED ORDER — ALUM & MAG HYDROXIDE-SIMETH 200-200-20 MG/5ML PO SUSP: 30.0000 mL | ORAL | Status: DC | PRN

## 2015-09-27 MED ORDER — HEPARIN SOD (PORK) LOCK FLUSH 100 UNIT/ML IV SOLN
500.0000 [IU] | Freq: Once | INTRAVENOUS | Status: AC
  Administered 2015-09-27: 500 [IU]
  Filled 2015-09-27: qty 5

## 2015-09-27 MED ORDER — LORAZEPAM 2 MG/ML IJ SOLN
2.0000 mg | Freq: Once | INTRAMUSCULAR | Status: AC
Start: 2015-09-27 — End: 2015-09-27
  Administered 2015-09-27: 2 mg via INTRAMUSCULAR

## 2015-09-27 MED ORDER — ZIPRASIDONE MESYLATE 20 MG IM SOLR
20.0000 mg | Freq: Once | INTRAMUSCULAR | Status: AC
  Administered 2015-09-27: 20 mg via INTRAMUSCULAR
  Filled 2015-09-27: qty 20

## 2015-09-27 MED ORDER — DIPHENHYDRAMINE HCL 25 MG PO CAPS
25.0000 mg | ORAL_CAPSULE | Freq: Every day | ORAL | Status: DC
  Filled 2015-09-27 (×3): qty 1

## 2015-09-27 MED ORDER — ZOLPIDEM TARTRATE 5 MG PO TABS
5.0000 mg | ORAL_TABLET | Freq: Every evening | ORAL | Status: DC | PRN
  Filled 2015-09-27 (×2): qty 2

## 2015-09-27 MED ORDER — LORAZEPAM 2 MG/ML IJ SOLN
2.0000 mg | Freq: Once | INTRAMUSCULAR | Status: AC
  Administered 2015-09-27: 2 mg via INTRAMUSCULAR
  Filled 2015-09-27: qty 1

## 2015-09-27 MED ORDER — LORAZEPAM 1 MG PO TABS
1.0000 mg | ORAL_TABLET | Freq: Three times a day (TID) | ORAL | Status: DC | PRN
  Administered 2015-09-29: 1 mg via ORAL
  Filled 2015-09-27: qty 1

## 2015-09-27 MED ORDER — ZIPRASIDONE MESYLATE 20 MG IM SOLR
10.0000 mg | Freq: Once | INTRAMUSCULAR | Status: AC
  Administered 2015-09-27: 10 mg via INTRAMUSCULAR

## 2015-09-27 MED ORDER — ZIPRASIDONE MESYLATE 20 MG IM SOLR
INTRAMUSCULAR | Status: AC
  Filled 2015-09-27: qty 20

## 2015-09-27 MED ORDER — ONDANSETRON HCL 4 MG PO TABS: 8.0000 mg | ORAL_TABLET | Freq: Two times a day (BID) | ORAL | Status: DC | PRN

## 2015-09-27 MED ORDER — STERILE WATER FOR INJECTION IJ SOLN
INTRAMUSCULAR | Status: AC
  Administered 2015-09-27: 10 mL
  Filled 2015-09-27: qty 10

## 2015-09-27 MED ORDER — LORATADINE 10 MG PO TABS
10.0000 mg | ORAL_TABLET | Freq: Every day | ORAL | Status: DC
  Administered 2015-09-28 – 2015-09-29 (×2): 10 mg via ORAL
  Filled 2015-09-27 (×3): qty 1

## 2015-09-27 MED ORDER — FLUCONAZOLE 100 MG PO TABS
100.0000 mg | ORAL_TABLET | Freq: Every day | ORAL | Status: DC
  Administered 2015-09-29 – 2015-09-30 (×2): 100 mg via ORAL
  Filled 2015-09-27 (×3): qty 1

## 2015-09-27 NOTE — ED Notes (Addendum)
Pt's friend is [name] [phone]). He would like to have the pt call him when she is awake. He would also like to talk to the social worker if pt consents to that.

## 2015-09-27 NOTE — ED Notes (Signed)
Patient ambulated to restroom, stand by assist available. Patient had urinated in the bed, bed linens were changed, patient placed back on monitor. Patient stated she was ready to go home. MD notified, states she would like her to speak to TTS first. Primary nurse updated.

## 2015-09-27 NOTE — ED Notes (Signed)
Pt states she is seeing the clock move and that staff is trying to trick her into staying here longer. Pt states it is really 1800. Pt states that earlier today pt was at church and was seeing people who "were not really there, but that's ok because God tells them do do that".

## 2015-09-27 NOTE — ED Provider Notes (Signed)
CSN: [id number]     Arrival date & time 09/27/15  1356 History   First MD Initiated Contact with Patient 09/27/15 1400     Chief Complaint  Patient presents with  . aggression      (Consider location/radiation/quality/duration/timing/severity/associated sxs/prior Treatment) HPI 55 year old female who presents with violent behavior. She has a history of breast cancer receiving CTX (last dose 5/18). Recently admitted early this month for paranoia and delusions, which has been thought to be due to her steroids which she has been taking with her CTX treatments. This was discontinued by her oncologist, Dr. Marin Olp as of her last visit on 5/19. History provided by police today. Level V caveat due to potential psychiatric disorder. Per PD, she was found trespassing today and ran into a church. Police was taking her home when she began banging around the back of the car yelling that they cannot keep her. According to family members, she has been acting unusual for one week now with paranoid behavior. On arrival, she is verbally and physically aggressive and unable to be redirected.   After i introduce myself as Dr. Oleta Mouse, her physician, she yells, "get away from me, you're not my doctor, you're monqiue" "you better stop sending messages to my son." She also becomes physicially aggressive with security at bedside, requiring sedation and restraints.  Past Medical History  Diagnosis Date  . Breast cancer of upper-outer quadrant of left female breast ([place]) 08/07/2015  . Anemia   . Hot flashes   . Family history of adverse reaction to anesthesia     younger sister has same issues with anesthesia  . Complication of anesthesia     patient states she has coded multiple times before with anesthesia. her sister has similar issues.   . Shortness of breath dyspnea     if patient's head is flat (per pt)  . Asthma     only in the summer time- uses albuterol inhaler  . GERD (gastroesophageal reflux disease)   .  History of hiatal hernia     had hiatal hernia repair in '94 (per patient)  . Arthritis   . Hx of peptic ulcer   . Hx of transfusion of packed red blood cells   . Sleep apnea     couldn't tolerate CPAP d/t claustrophobia; sleep upright "90 degree angle": study done at [facility] several years ago   Past Surgical History  Procedure Laterality Date  . Abdominal hysterectomy    . Tubal ligation    . Hernia repair    . Colonoscopy    . Upper gi endoscopy    . Portacath placement N/A 08/20/2015    Procedure: INSERTION PORT-A-CATH;  Surgeon: Stark Klein, MD;  Location: [facility] OR;  Service: General;  Laterality: N/A;   Family History  Problem Relation Age of Onset  . Colon cancer Brother 63  . Throat cancer Maternal Grandfather   . Lung cancer Maternal Uncle   . Ovarian cancer Sister 82    suspected but not pathologically confirmed  . Lung cancer Paternal Uncle   . Breast cancer Maternal Aunt 60   Social History  Substance Use Topics  . Smoking status: Never Smoker   . Smokeless tobacco: None  . Alcohol Use: No   OB History    No data available     Review of Systems    Allergies  Review of patient's allergies indicates no known allergies.  Home Medications   Prior to Admission medications  Medication Sig Start Date End Date Taking? Authorizing Provider  dexamethasone (DECADRON) 4 MG tablet Take 2 tablets by mouth once a day on the day after chemotherapy and then take 2 tablets two times a day for 2 days. Take with food. 08/21/15   Laurie Panda, NP  diphenhydrAMINE (BENADRYL) 25 mg capsule Take 25 mg by mouth at bedtime.    Historical Provider, MD  fluconazole (DIFLUCAN) 100 MG tablet Take 1 tablet (100 mg total) by mouth daily. 09/15/15   Susanne Borders, NP  lidocaine-prilocaine (EMLA) cream Apply to affected area once 08/21/15   Laurie Panda, NP  loratadine (CLARITIN) 10 MG tablet Take 1 tablet (10 mg total) by mouth daily. 08/21/15   Laurie Panda, NP   Multiple Vitamin (MULTIVITAMIN WITH MINERALS) TABS tablet Take 1 tablet by mouth daily.    Historical Provider, MD  ondansetron (ZOFRAN) 8 MG tablet Take 1 tablet (8 mg total) by mouth 2 (two) times daily as needed. Start on the third day after chemotherapy. 08/21/15   Laurie Panda, NP  valACYclovir (VALTREX) 500 MG tablet Take 1 tablet (500 mg total) by mouth daily. 09/10/15   Laurie Panda, NP  zolpidem (AMBIEN) 5 MG tablet Take 1-2 tablets (5-10 mg total) by mouth at bedtime as needed for sleep. 09/03/15   Chauncey Cruel, MD   BP 116/65 mmHg  Pulse 89  Temp(Src) 98.3 F (36.8 C) (Oral)  Resp 19  SpO2 100% Physical Exam Physical Exam  Nursing note and vitals reviewed. Constitutional: Well developed, well nourished, non-toxic, seems agitated with pressured speech and pyschomotor agitation Head: Normocephalic and atraumatic.  Mouth/Throat: Oropharynx is clear and moist.  Neck: Normal range of motion. Neck supple.  Cardiovascular: Normal rate and regular rhythm.   Pulmonary/Chest: Effort normal and breath sounds normal.  Abdominal: Soft. There is no tenderness. There is no rebound and no guarding.  Musculoskeletal: Normal range of motion.  Neurological: Alert, no facial droop, fluent speech, moves all extremities symmetrically Skin: Skin is warm and dry.    ED Course  Procedures (including critical care time) Labs Review Labs Reviewed  CBC WITH DIFFERENTIAL/PLATELET - Abnormal; Notable for the following:    WBC 81.8 (*)    RBC 3.03 (*)    Hemoglobin 9.5 (*)    HCT 27.7 (*)    Neutro Abs 78.6 (*)    Monocytes Absolute 1.6 (*)    All other components within normal limits  COMPREHENSIVE METABOLIC PANEL - Abnormal; Notable for the following:    Glucose, Bld 112 (*)    AST 42 (*)    Alkaline Phosphatase 127 (*)    All other components within normal limits  ACETAMINOPHEN LEVEL - Abnormal; Notable for the following:    Acetaminophen (Tylenol), Serum <10 (*)    All other  components within normal limits  MAGNESIUM  PHOSPHORUS  URINALYSIS, ROUTINE W REFLEX MICROSCOPIC (NOT AT [facility])  URINE RAPID DRUG SCREEN, HOSP PERFORMED  SALICYLATE LEVEL  ETHANOL    Imaging Review Dg Chest 2 View  09/27/2015  CLINICAL DATA:  Patient receiving chemotherapy for breast cancer. Violent outbursts. EXAM: CHEST  2 VIEW COMPARISON:  None. FINDINGS: The left Port-A-Cath is stable. No pneumothorax. The heart, hila, mediastinum, lungs, and pleura are otherwise unremarkable. The lateral view is markedly limited as the patient was imaged with her arm down. IMPRESSION: No active cardiopulmonary disease. Electronically Signed   By: Dorise Bullion III M.D   On: 09/27/2015 16:45  Ct Head Wo Contrast  09/27/2015  CLINICAL DATA:  55 year old currently undergoing chemotherapy for breast cancer, presenting today with violent behavior and delusions. Patient recently admitted for paranoia and delusions. EXAM: CT HEAD WITHOUT CONTRAST TECHNIQUE: Contiguous axial images were obtained from the base of the skull through the vertex without intravenous contrast. COMPARISON:  09/24/2015. FINDINGS: Ventricular system normal in size and appearance for age. No mass lesion. No midline shift. No acute hemorrhage or hematoma. No extra-axial fluid collections. No evidence of acute infarction. No focal brain parenchymal abnormalities. No focal osseous abnormalities involving the skull. Visualized paranasal sinuses, bilateral mastoid air cells, and bilateral middle ear cavities well-aerated. IMPRESSION: Normal examination. Electronically Signed   By: Evangeline Dakin M.D.   On: 09/27/2015 15:03   I have personally reviewed and evaluated these images and lab results as part of my medical decision-making.   EKG Interpretation None      MDM   Final diagnoses:  Paranoid delusion ([place])    Spoke with Dr. Morey Hummingbird who is currently on-call for oncology. She is reviewed patient's chemotherapy protocol, and does not  think that this is the etiology of her paranoid delusions and aggressive behavior. CT head shows no metastatic disease. She has no major electrolyte or metabolic derangements to explain this presentation. Chest x-ray does not show pneumonia and her urinalysis does not show infection. Her tox screen is also negative. She does have leukocytosis of 81, and I have discussed this with Dr. Annamaria Boots who thinks this is secondary to Neulasta. She is afebrile here, no meningismus, and with her ongoing behavioral disturbance over this past month, she did not feel that she required an LP. I have initially spoken with the hospitalist who felt that patient now is medically cleared for psychiatric evaluation. Unlikely to be 2/2 steroids as cause of these mental changes. Will need psych eval. TTS consulted. IVC placed.    Forde Dandy, MD 09/27/15 2251

## 2015-09-27 NOTE — ED Notes (Signed)
TTS at bedside. 

## 2015-09-27 NOTE — BH Assessment (Signed)
Writer consulted with Dr. Oleta Mouse regarding the patient. Patient was given medication due to aggression and is not able to be assessed.   Dr. Oleta Mouse will take out the consult

## 2015-09-28 ENCOUNTER — Other Ambulatory Visit: Payer: Self-pay | Admitting: Nurse Practitioner

## 2015-09-28 ENCOUNTER — Telehealth: Payer: Self-pay | Admitting: *Deleted

## 2015-09-28 DIAGNOSIS — F19951 Other psychoactive substance use, unspecified with psychoactive substance-induced psychotic disorder with hallucinations: Secondary | ICD-10-CM

## 2015-09-28 DIAGNOSIS — IMO0002 Reserved for concepts with insufficient information to code with codable children: Secondary | ICD-10-CM | POA: Diagnosis present

## 2015-09-28 DIAGNOSIS — F22 Delusional disorders: Secondary | ICD-10-CM | POA: Diagnosis present

## 2015-09-28 MED ORDER — STERILE WATER FOR INJECTION IJ SOLN
INTRAMUSCULAR | Status: AC
  Administered 2015-09-28: 08:00:00
  Filled 2015-09-28: qty 10

## 2015-09-28 MED ORDER — LORAZEPAM 2 MG/ML IJ SOLN
2.0000 mg | Freq: Once | INTRAMUSCULAR | Status: DC
  Filled 2015-09-28: qty 1

## 2015-09-28 MED ORDER — QUETIAPINE FUMARATE 25 MG PO TABS
25.0000 mg | ORAL_TABLET | Freq: Three times a day (TID) | ORAL | Status: DC
  Administered 2015-09-29 – 2015-09-30 (×3): 25 mg via ORAL
  Filled 2015-09-28 (×6): qty 1

## 2015-09-28 MED ORDER — QUETIAPINE FUMARATE 50 MG PO TABS
50.0000 mg | ORAL_TABLET | Freq: Every day | ORAL | Status: DC
  Filled 2015-09-28: qty 1

## 2015-09-28 MED ORDER — LORAZEPAM 2 MG/ML IJ SOLN
2.0000 mg | Freq: Once | INTRAMUSCULAR | Status: AC
  Administered 2015-09-28: 2 mg via INTRAMUSCULAR

## 2015-09-28 MED ORDER — ZIPRASIDONE MESYLATE 20 MG IM SOLR
10.0000 mg | Freq: Once | INTRAMUSCULAR | Status: AC
  Administered 2015-09-28: 10 mg via INTRAMUSCULAR
  Filled 2015-09-28: qty 20

## 2015-09-28 NOTE — ED Notes (Signed)
Bed: WA18 Expected date:  Expected time:  Means of arrival:  Comments: RES B 

## 2015-09-28 NOTE — ED Notes (Signed)
At shift change pt continuous pacing to nursing station. Pt assisted to room and given orange juice and decaf coffee per request.

## 2015-09-28 NOTE — Consult Note (Signed)
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 Delusions ([place]) [F22] 09/28/2015    Priority: High  . 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 Acute delirium [R41.0] 09/24/2015  . Acute encephalopathy [G93.40] 09/24/2015  . Leukocytosis [D72.829] 09/24/2015  . Other affective psychosis [F39] 09/24/2015  . GERD (gastroesophageal reflux disease) [K21.9]   . Acute paranoia ([place]) [F22]   . Asthma, mild intermittent [J45.20]   . Breast cancer of upper-outer quadrant of left female breast [facility]) [C50.412] 08/07/2015    Total Time spent with patient: 45 minutes  Subjective:   Gloria Lang is a 55 y.o. female patient admitted with psychosis.  Delirious ED visit on 5/19 and Seroquel was recommended by the oncologist, started today.  HPI:  55 yo female who presented with psychosis and delusions, possibly a side effect from her recent Prednisone use or other side effects from her breast cancer. Pleasantly psychotic with some insight.  She states she was playing "tricks" on her family and knew the two providers were images of Gloria Lang and Gloria Lang but not Gloria Lang and Gloria Lang.  Gloria Lang is hyperreligious and believes Gloria Lang and [place] tell her what medications to take and which ones not to take.  Family reports paranoid behavior and prior to admission she ran into a church.  She also though the EDP was a messenger from her son.  Admission needed for stabilization of symptoms as she has been medically cleared by the oncologist and EDP.  Past Psychiatric History: none  Risk to Self: Suicidal Ideation: No Suicidal Intent: No Is patient at risk for suicide?: No Suicidal Plan?: No Access to Means: No What has been your use of drugs/alcohol within the last 12 months?:  Denies How many times?: 0 Other Self Harm Risks: Denies Triggers for Past Attempts: None known Intentional Self Injurious Behavior: None Risk to Others: Homicidal Ideation: No Thoughts of Harm to Others: No Current Homicidal Intent: No Current Homicidal Plan: No Access to Homicidal Means: No Identified Victim: Denies History of harm to others?: No Assessment of Violence: None Noted Violent Behavior Description: Denies Does patient have access to weapons?: No Criminal Charges Pending?: No Does patient have a court date: No Prior Inpatient Therapy: Prior Inpatient Therapy: No Prior Therapy Dates: N/A Prior Therapy Facilty/Provider(s): N/A Reason for Treatment: N/A Prior Outpatient Therapy: Prior Outpatient Therapy: Yes Prior Therapy Dates: "in my teens" Prior Therapy Facilty/Provider(s): [facility] Reason for Treatment: conflict with mother Does patient have an ACCT team?: No Does patient have Intensive In-House Services?  : No Does patient have [facility] services? : No Does patient have P4[facility] services?: No  Past Medical History:  Past Medical History  Diagnosis Date  . Breast cancer of upper-outer quadrant of left female breast ([place]) 08/07/2015  . Anemia   . Hot flashes   . Family history of adverse reaction to anesthesia     younger sister has same issues with anesthesia  . Complication of anesthesia     patient states she has coded multiple times before with anesthesia. her sister has similar issues.   . Shortness of breath dyspnea     if patient's head is flat (per pt)  . Asthma     only in the summer time- uses albuterol inhaler  . GERD (  gastroesophageal reflux disease)   . History of hiatal hernia     had hiatal hernia repair in '94 (per patient)  . Arthritis   . Hx of peptic ulcer   . Hx of transfusion of packed red blood cells   . Sleep apnea     couldn't tolerate CPAP d/t claustrophobia; sleep upright "90 degree angle": [facility] done at [facility] years ago     Past Surgical History  Procedure Laterality Date  . Abdominal hysterectomy    . Tubal ligation    . Hernia repair    . Colonoscopy    . Upper gi endoscopy    . Portacath placement N/A 08/20/2015    Procedure: INSERTION PORT-A-CATH;  Surgeon: Stark Klein, MD;  Location: [facility] OR;  Service: General;  Laterality: N/A;   Family History:  Family History  Problem Relation Age of Onset  . Colon cancer Brother 35  . Throat cancer Maternal Grandfather   . Lung cancer Maternal Uncle   . Ovarian cancer Sister 48    suspected but not pathologically confirmed  . Lung cancer Paternal Uncle   . Breast cancer Maternal Aunt 60   Family Psychiatric  History: unknown Social History:  History  Alcohol Use No     History  Drug Use No    Social History   Social History  . Marital Status: Single    Spouse Name: N/A  . Number of Children: N/A  . Years of Education: N/A   Social History Main Topics  . Smoking status: Never Smoker   . Smokeless tobacco: None  . Alcohol Use: No  . Drug Use: No  . Sexual Activity: Not Asked   Other Topics Concern  . None   Social History Narrative   Additional Social History:    Allergies:   Allergies  Allergen Reactions  . Ativan [Lorazepam]     Pt states shes allergic to ativan no specific reactions    Labs:  Results for orders placed or performed during the hospital encounter of 09/27/15 (from the past 48 hour(s))  CBC with Differential     Status: Abnormal   Collection Time: 09/27/15  2:32 PM  Result Value Ref Range   WBC 81.8 (HH) 4.0 - 10.5 K/uL    Comment: RESULT REPEATED AND VERIFIED CRITICAL RESULT CALLED TO, READ BACK BY AND VERIFIED WITH: THORNTON,S AT 1525 ON [id number] BY HOOKER,B    RBC 3.03 (L) 3.87 - 5.11 MIL/uL   Hemoglobin 9.5 (L) 12.0 - 15.0 g/dL   HCT 27.7 (L) 36.0 - 46.0 %   MCV 91.4 78.0 - 100.0 fL   MCH 31.4 26.0 - 34.0 pg   MCHC 34.3 30.0 - 36.0 g/dL   RDW 15.4 11.5 - 15.5 %   Platelets 399 150 - 400 K/uL     Comment: RESULT REPEATED AND VERIFIED SPECIMEN CHECKED FOR CLOTS PLATELET COUNT CONFIRMED BY SMEAR    Neutrophils Relative % 96 %   Lymphocytes Relative 2 %   Monocytes Relative 2 %   Eosinophils Relative 0 %   Basophils Relative 0 %   Neutro Abs 78.6 (H) 1.7 - 7.7 K/uL   Lymphs Abs 1.6 0.7 - 4.0 K/uL   Monocytes Absolute 1.6 (H) 0.1 - 1.0 K/uL   Eosinophils Absolute 0.0 0.0 - 0.7 K/uL   Basophils Absolute 0.0 0.0 - 0.1 K/uL   WBC Morphology INCREASED BANDS (>20% BANDS)   Comprehensive metabolic panel     Status: Abnormal   Collection  Time: 09/27/15  2:32 PM  Result Value Ref Range   Sodium 136 135 - 145 mmol/L   Potassium 3.6 3.5 - 5.1 mmol/L   Chloride 104 101 - 111 mmol/L   CO2 23 22 - 32 mmol/L   Glucose, Bld 112 (H) 65 - 99 mg/dL   BUN 11 6 - 20 mg/dL   Creatinine, Ser 0.75 0.44 - 1.00 mg/dL   Calcium 9.4 8.9 - 10.3 mg/dL   Total Protein 6.7 6.5 - 8.1 g/dL   Albumin 4.4 3.5 - 5.0 g/dL   AST 42 (H) 15 - 41 U/L   ALT 48 14 - 54 U/L   Alkaline Phosphatase 127 (H) 38 - 126 U/L   Total Bilirubin 0.6 0.3 - 1.2 mg/dL   GFR calc non Af Amer >60 >60 mL/min   GFR calc Af Amer >60 >60 mL/min    Comment: (NOTE) The eGFR has been calculated using the CKD EPI equation. This calculation has not been validated in all clinical situations. eGFR's persistently <60 mL/min signify possible Chronic Kidney Disease.    Anion gap 9 5 - 15  Magnesium     Status: None   Collection Time: 09/27/15  2:32 PM  Result Value Ref Range   Magnesium 2.0 1.7 - 2.4 mg/dL  Phosphorus     Status: None   Collection Time: 09/27/15  2:32 PM  Result Value Ref Range   Phosphorus 3.4 2.5 - 4.6 mg/dL  Acetaminophen level     Status: Abnormal   Collection Time: 09/27/15  2:32 PM  Result Value Ref Range   Acetaminophen (Tylenol), Serum <10 (L) 10 - 30 ug/mL    Comment:        THERAPEUTIC CONCENTRATIONS VARY SIGNIFICANTLY. A RANGE OF 10-30 ug/mL MAY BE AN EFFECTIVE CONCENTRATION FOR MANY  PATIENTS. HOWEVER, SOME ARE BEST TREATED AT CONCENTRATIONS OUTSIDE THIS RANGE. ACETAMINOPHEN CONCENTRATIONS >150 ug/mL AT 4 HOURS AFTER INGESTION AND >50 ug/mL AT 12 HOURS AFTER INGESTION ARE OFTEN ASSOCIATED WITH TOXIC REACTIONS.   Salicylate level     Status: None   Collection Time: 09/27/15  2:32 PM  Result Value Ref Range   Salicylate Lvl <7.2 2.8 - 30.0 mg/dL  Ethanol     Status: None   Collection Time: 09/27/15  2:32 PM  Result Value Ref Range   Alcohol, Ethyl (B) <5 <5 mg/dL    Comment:        LOWEST DETECTABLE LIMIT FOR SERUM ALCOHOL IS 5 mg/dL FOR MEDICAL PURPOSES ONLY   Urinalysis, Routine w reflex microscopic (not at [facility])     Status: None   Collection Time: 09/27/15  3:45 PM  Result Value Ref Range   Color, Urine YELLOW YELLOW   APPearance CLEAR CLEAR   Specific Gravity, Urine 1.005 1.005 - 1.030   pH 6.0 5.0 - 8.0   Glucose, UA NEGATIVE NEGATIVE mg/dL   Hgb urine dipstick NEGATIVE NEGATIVE   Bilirubin Urine NEGATIVE NEGATIVE   Ketones, ur NEGATIVE NEGATIVE mg/dL   Protein, ur NEGATIVE NEGATIVE mg/dL   Nitrite NEGATIVE NEGATIVE   Leukocytes, UA NEGATIVE NEGATIVE    Comment: MICROSCOPIC NOT DONE ON URINES WITH NEGATIVE PROTEIN, BLOOD, LEUKOCYTES, NITRITE, OR GLUCOSE <1000 mg/dL.  Urine rapid drug screen (hosp performed)     Status: None   Collection Time: 09/27/15  3:45 PM  Result Value Ref Range   Opiates NONE DETECTED NONE DETECTED   Cocaine NONE DETECTED NONE DETECTED   Benzodiazepines NONE DETECTED NONE DETECTED   Amphetamines  NONE DETECTED NONE DETECTED   Tetrahydrocannabinol NONE DETECTED NONE DETECTED   Barbiturates NONE DETECTED NONE DETECTED    Comment:        DRUG SCREEN FOR MEDICAL PURPOSES ONLY.  IF CONFIRMATION IS NEEDED FOR ANY PURPOSE, NOTIFY LAB WITHIN 5 DAYS.        LOWEST DETECTABLE LIMITS FOR URINE DRUG SCREEN Drug Class       Cutoff (ng/mL) Amphetamine      1000 Barbiturate      200 Benzodiazepine   329 Tricyclics        924 Opiates          300 Cocaine          300 THC              50     Current Facility-Administered Medications  Medication Dose Route Frequency Provider Last Rate Last Dose  . acetaminophen (TYLENOL) tablet 650 mg  650 mg Oral Q4H PRN Forde Dandy, MD      . alum & mag hydroxide-simeth (MAALOX/MYLANTA) 200-200-20 MG/5ML suspension 30 mL  30 mL Oral PRN Forde Dandy, MD      . diphenhydrAMINE (BENADRYL) capsule 25 mg  25 mg Oral QHS Forde Dandy, MD   25 mg at 09/27/15 2310  . fluconazole (DIFLUCAN) tablet 100 mg  100 mg Oral Daily Forde Dandy, MD   100 mg at 09/28/15 1016  . loratadine (CLARITIN) tablet 10 mg  10 mg Oral Daily Forde Dandy, MD   10 mg at 09/28/15 1016  . LORazepam (ATIVAN) tablet 1 mg  1 mg Oral Q8H PRN Forde Dandy, MD      . [facility]) tablet 8 mg  8 mg Oral BID PRN Forde Dandy, MD      . QUEtiapine (SEROQUEL) tablet 25 mg  25 mg Oral TID Patrecia Pour, NP      . valACYclovir (VALTREX) tablet 500 mg  500 mg Oral Daily Forde Dandy, MD   500 mg at 09/28/15 1016  . zolpidem (AMBIEN) tablet 5-10 mg  5-10 mg Oral QHS PRN Forde Dandy, MD       Current Outpatient Prescriptions  Medication Sig Dispense Refill  . diphenhydrAMINE (BENADRYL) 25 mg capsule Take 25 mg by mouth daily as needed for allergies.     Marland Kitchen lidocaine-prilocaine (EMLA) cream Apply to affected area once 30 g 3  . loratadine (CLARITIN) 10 MG tablet Take 1 tablet (10 mg total) by mouth daily. (Patient taking differently: Take 10 mg by mouth daily as needed for allergies (leg pain). ) 30 tablet 1  . Multiple Vitamin (MULTIVITAMIN WITH MINERALS) TABS tablet Take 1 tablet by mouth daily.    . valACYclovir (VALTREX) 500 MG tablet Take 1 tablet (500 mg total) by mouth daily. 20 tablet 0  . zolpidem (AMBIEN) 5 MG tablet Take 1-2 tablets (5-10 mg total) by mouth at bedtime as needed for sleep. 30 tablet 0  . dexamethasone (DECADRON) 4 MG tablet Take 2 tablets by mouth once a day on the day after  chemotherapy and then take 2 tablets two times a day for 2 days. Take with food. (Patient not taking: Reported on 09/28/2015) 30 tablet 1  . fluconazole (DIFLUCAN) 100 MG tablet Take 1 tablet (100 mg total) by mouth daily. (Patient not taking: Reported on 09/28/2015) 7 tablet 0  . ondansetron (ZOFRAN) 8 MG tablet Take 1 tablet (8 mg total) by mouth 2 (two) times  daily as needed. Start on the third day after chemotherapy. (Patient not taking: Reported on 09/28/2015) 30 tablet 1    Musculoskeletal: Strength & Muscle Tone: within normal limits Gait & Station: normal Patient leans: N/A  Psychiatric Specialty Exam: Physical Exam  Constitutional: She is oriented to person, place, and time. She appears well-developed and well-nourished.  HENT:  Head: Normocephalic.  Neck: Normal range of motion.  Respiratory: Effort normal.  Musculoskeletal: Normal range of motion.  Neurological: She is alert and oriented to person, place, and time.  Skin: Skin is warm and dry.    Review of Systems  Constitutional: Negative.   HENT: Negative.   Eyes: Negative.   Respiratory: Negative.   Cardiovascular: Negative.   Gastrointestinal: Negative.   Genitourinary: Negative.   Musculoskeletal: Negative.   Skin: Negative.   Neurological: Negative.   Endo/Heme/Allergies: Negative.   Psychiatric/Behavioral: Positive for hallucinations.    Blood pressure 130/75, pulse 120, temperature 98.3 F (36.8 C), temperature source Oral, resp. rate 22, SpO2 100 %.There is no weight on file to calculate BMI.  General Appearance: Casual  Eye Contact:  Good  Speech:  Normal Rate  Volume:  Normal  Mood:  Anxious  Affect:  Congruent  Thought Process:  Coherent  Orientation:  Full (Time, Place, and Person)  Thought Content:  Delusions and Hallucinations: Auditory Visual  Suicidal Thoughts:  No  Homicidal Thoughts:  No  Memory:  Immediate;   Fair Recent;   Fair Remote;   Fair  Judgement:  Impaired  Insight:  Fair   Psychomotor Activity:  Decreased  Concentration:  Concentration: Fair and Attention Span: Fair  Recall:  [place] of Knowledge:  Good  Language:  Good  Akathisia:  No  Handed:  Right  AIMS (if indicated):     Assets:  Housing Leisure Time Resilience Social Support  ADL's:  Intact  Cognition:  Impaired,  Mild  Sleep:       Treatment Plan Summary: Daily contact with patient to assess and evaluate symptoms and progress in treatment, Medication management and Plan delusional disorder; psychosis related to prednisone use:  -Crisis stabilization -Medication management:  Seroquel 25 mg TID and 50 mg at bedtime started for psychosis along with her medical medications -Individual counseling  Disposition: Recommend psychiatric Inpatient admission when medically cleared.  Waylan Boga, NP 09/28/2015 2:46 PM

## 2015-09-28 NOTE — ED Notes (Signed)
Pt's daughter dropped off a bag of belongings including: A hat, one set of keys, a partially used bottle of body spray, a cell phone with a cracked screen, a tooth brush, a pair of blue jeans, a red turtleneck, a hand towel, a stick of Deoderant, a toothbrush, a box of tissues, a pair of pj pants, 2 bras, a pair of multicolored pants, a multi colored shirt, a blue shirt, 2 pairs of underwear, 2 cans of grape soda, a second set of keys, a neck scarf, 1 chapstick, and hand written letters.   Also while the pt's daughter in law dropped off belongings she stated that she has the children with her who are in the pt's legal custody. The pt's son will be calling her from jail tomorrow to inform pt that the children are being taken to their maternal grandmother's house in [place]. There is a phone in her bag, her son will call that phone. The daughter in law left the name and number of Linzie Collin- [phone]

## 2015-09-28 NOTE — ED Notes (Signed)
Pt given decaf coffee per request. Pt moved to RM 18 to decrease stimulation, increase comfort, and access to television.

## 2015-09-28 NOTE — ED Notes (Signed)
Pt found walking into other pt rooms and stating "watch your credit cards they will take your money." Pt verbally threatening staff; see note 0800 per Teup. Pt behavior continues to escalate.Therapeutic conversation and redirection unsuccessful; see MAR.

## 2015-09-28 NOTE — BH Assessment (Signed)
[facility] Assessment Progress Note  Per Donnelly Angelica, MD, this pt requires psychiatric hospitalization at this time.  The following facilities have been contacted to seek placement for this pt, with results as noted:  Beds available, information sent, decision pending:  [facility]   At capacity:  [place], [place] Triage Specialist [phone]

## 2015-09-28 NOTE — ED Notes (Addendum)
Pt would not remain in her room.  Walks in the hallways and in pt's rooms.  Has threatened staff stating "you're on my hit list."  Pt goes around in the room looking for "stuff you're not doing right."

## 2015-09-28 NOTE — ED Notes (Signed)
Refused pm medications

## 2015-09-28 NOTE — Telephone Encounter (Signed)
TC received from a woman who identified herself as Margo Aye - her employer and owner of with [employer]. Estill Bamberg states that she believes that pt has identified her on her ROI as someone that can be told patient related information.  Reviewed all available scanned ROI documents andMs. Gane's name was not listed so therefore I could not provide any information to her about this patient. Ms. Anderson Malta voiced understanding. No information exchanged during this conversation.

## 2015-09-28 NOTE — ED Notes (Signed)
Portacath site CDI.

## 2015-09-28 NOTE — BH Assessment (Addendum)
Assessment Note  Gloria Lang is an 55 y.o. female presenting to [facility] under IVC.   IVC states:  Exhibiting paranoid delusions. Initially states that I am not her doctor and that my name is Beckie Busing and I have been inappropriately texting her son. Becomes verbally and physically aggressive, pushing me out of her room in the ED. Unable to be redirected.   Upon assessment patient denies SI/HI and AVH. Patient denies previous attempts of suicide and reports "why would I kill myself and I can turn to God." Patient denies self injurious behaviors. Patient denies HI and history of aggression. Patient reports "ain't nobody got time to go to jail, I got God." Patient denies access to firearms. Patient denies pending charges, upcoming court dates, and active probation. Patient denies AVH and does not appear to be responding to internal stimuli. Patient may be hyper religious at this time as she is very tangential and talks about her relationship with God after answering every question. Patient talks about her family and how they are very supportive. Patient denis use of drugs and alcohol. Patients UDS and BAL clear upon arrival to the ED.   Patient is alert and oriented x4. Patient is calm and cooperative with this Probation officer. Patient is pleasant and has pressured speech. Patient is tangential and cannot be redirected at time of assessment. Patient denies history of inpatient treatment and reports that she saw a psychiatrist one time in her "teens" due to an argument with her mother and "never went back." Patient reports "ain't nothing wrong with me, what I need to see a psychiatrist for?" Patient does not seem to recall what led her to coming into the ED.    Diagnosis: Unspecified schizophrenia spectrum and other psychotic disorder  Past Medical History:  Past Medical History  Diagnosis Date  . Breast cancer of upper-outer quadrant of left female breast (Gloria Lang) 08/07/2015  . Anemia   . Hot flashes   . Family  history of adverse reaction to anesthesia     younger sister has same issues with anesthesia  . Complication of anesthesia     patient states she has coded multiple times before with anesthesia. her sister has similar issues.   . Shortness of breath dyspnea     if patient's head is flat (per pt)  . Asthma     only in the summer time- uses albuterol inhaler  . GERD (gastroesophageal reflux disease)   . History of hiatal hernia     had hiatal hernia repair in '94 (per patient)  . Arthritis   . Hx of peptic ulcer   . Hx of transfusion of packed red blood cells   . Sleep apnea     couldn't tolerate CPAP d/t claustrophobia; sleep upright "90 degree angle": [facility] several years ago    Past Surgical History  Procedure Laterality Date  . Abdominal hysterectomy    . Tubal ligation    . Hernia repair    . Colonoscopy    . Upper gi endoscopy    . Portacath placement N/A 08/20/2015    Procedure: INSERTION PORT-A-CATH;  Surgeon: Stark Klein, MD;  Location: [facility] OR;  Service: General;  Laterality: N/A;    Family History:  Family History  Problem Relation Age of Onset  . Colon cancer Brother 2  . Throat cancer Maternal Grandfather   . Lung cancer Maternal Uncle   . Ovarian cancer Sister 58    suspected but not pathologically confirmed  .  Lung cancer Paternal Uncle   . Breast cancer Maternal Aunt 60    Social History:  reports that she has never smoked. She does not have any smokeless tobacco history on file. She reports that she does not drink alcohol or use illicit drugs.  Additional Social History:  Alcohol / Drug Use Pain Medications: See PTA Prescriptions: See PTA Over the Counter: See PTA History of alcohol / drug use?: No history of alcohol / drug abuse  CIWA: CIWA-Ar BP: 95/63 mmHg Pulse Rate: 94 COWS:    Allergies:  Allergies  Allergen Reactions  . Ativan [Lorazepam]     Pt states shes allergic to ativan no specific reactions    Home  Medications:  (Not in a hospital admission)  OB/GYN Status:  No LMP recorded. Patient has had a hysterectomy.  General Assessment Data [facility] of [facility]: [facility] TTS Assessment: In system Is this a Tele or Face-to-Face Assessment?: Face-to-Face Is this an Initial Assessment or a Re-assessment for this encounter?: Initial Assessment Marital status: Divorced [place] name: Gloria Lang Is patient pregnant?: No Pregnancy Status: No Living Arrangements: Other relatives (grandchildren) Can pt return to current living arrangement?: Yes Admission Status: Involuntary Is patient capable of signing voluntary admission?: Yes Referral Source: Self/Family/Friend Insurance type: [place] Living Arrangements: Other relatives (grandchildren) Name of Psychiatrist: None Name of Therapist: None  Education Status Is patient currently in school?: No Highest grade of school patient has completed: 12th  Risk to self with the past 6 months Suicidal Ideation: No Has patient been a risk to self within the past 6 months prior to admission? : No Suicidal Intent: No Has patient had any suicidal intent within the past 6 months prior to admission? : No Is patient at risk for suicide?: No Suicidal Plan?: No Has patient had any suicidal plan within the past 6 months prior to admission? : No Access to Means: No What has been your use of drugs/alcohol within the last 12 months?: Denies Previous Attempts/Gestures: No How many times?: 0 Other Self Harm Risks: Denies Triggers for Past Attempts: None known Intentional Self Injurious Behavior: None Family Suicide History: Yes (father) Recent stressful life event(s):  (denies) Persecutory voices/beliefs?: No Depression:  (denies) Depression Symptoms:  (denies) Substance abuse history and/or treatment for substance abuse?: No Suicide prevention information given to non-admitted patients: Not applicable  Risk to Others within the past 6  months Homicidal Ideation: No Does patient have any lifetime risk of violence toward others beyond the six months prior to admission? : No Thoughts of Harm to Others: No Current Homicidal Intent: No Current Homicidal Plan: No Access to Homicidal Means: No Identified Victim: Denies History of harm to others?: No Assessment of Violence: None Noted Violent Behavior Description: Denies Does patient have access to weapons?: No Criminal Charges Pending?: No Does patient have a court date: No Is patient on probation?: No  Psychosis Hallucinations: None noted Delusions: None noted  Mental Status Report Appearance/Hygiene: In scrubs Eye Contact: Fair Motor Activity: Unable to assess Speech: Rapid Level of Consciousness: Alert Mood: Pleasant Affect: Appropriate to circumstance Anxiety Level: None Thought Processes: Tangential Judgement: Partial Orientation: Person, Place, Time, Situation, Appropriate for developmental age Obsessive Compulsive Thoughts/Behaviors: None  Cognitive Functioning Concentration: Normal Memory: Recent Intact, Remote Intact IQ: Average Insight: Fair Impulse Control: Fair Appetite: Good Sleep: No Change Vegetative Symptoms: None  ADLScreening [facility] Assessment Services) Patient's cognitive ability adequate to safely complete daily activities?: Yes Patient able to express need  for assistance with ADLs?: Yes Independently performs ADLs?: Yes (appropriate for developmental age)  Prior Inpatient Therapy Prior Inpatient Therapy: No Prior Therapy Dates: N/A Prior Therapy Facilty/Provider(s): N/A Reason for Treatment: N/A  Prior Outpatient Therapy Prior Outpatient Therapy: Yes Prior Therapy Dates: "in my teens" Prior Therapy Facilty/Provider(s): [facility] Reason for Treatment: conflict with mother Does patient have an ACCT team?: No Does patient have Intensive In-House Services?  : No Does patient have [facility] services? : No Does patient have P4CC  services?: No  ADL Screening (condition at time of admission) Patient's cognitive ability adequate to safely complete daily activities?: Yes Is the patient deaf or have difficulty hearing?: No Does the patient have difficulty seeing, even when wearing glasses/contacts?: No Does the patient have difficulty concentrating, remembering, or making decisions?: No Patient able to express need for assistance with ADLs?: Yes Does the patient have difficulty dressing or bathing?: No Independently performs ADLs?: Yes (appropriate for developmental age) Does the patient have difficulty walking or climbing stairs?: No Weakness of Legs: None Weakness of Arms/Hands: None  Home Assistive Devices/Equipment Home Assistive Devices/Equipment: None  Therapy Consults (therapy consults require a physician order) PT Evaluation Needed: No OT Evalulation Needed: No SLP Evaluation Needed: No Abuse/Neglect Assessment (Assessment to be complete while patient is alone) Physical Abuse: Denies Verbal Abuse: Denies Sexual Abuse: Denies Exploitation of patient/patient's resources: Denies Self-Neglect: Denies Values / Beliefs Cultural Requests During Hospitalization: None Spiritual Requests During Hospitalization: None Consults Spiritual Care Consult Needed: No Social Work Consult Needed: No Regulatory affairs officer (For Healthcare) Does patient have an advance directive?: No Would patient like information on creating an advanced directive?: No - patient declined information    Additional Information 1:1 In Past 12 Months?: No CIRT Risk: No Elopement Risk: No Does patient have medical clearance?: Yes     Disposition:  Disposition Initial Assessment Completed for this Encounter: Yes Disposition of Patient: Inpatient treatment program (per Dr. Lovena Lang) Type of inpatient treatment program: Adult  On Site Evaluation by:   Reviewed with Physician:    Trenten Watchman 09/28/2015 12:27 PM

## 2015-09-28 NOTE — ED Notes (Signed)
Pt resting on stretcher with eyes closed, easily arousable for VS, NAD

## 2015-09-28 NOTE — ED Notes (Addendum)
Pt reports she wants to be woken when next meal trays arrive. Psychiatry at bedside

## 2015-09-28 NOTE — ED Notes (Signed)
Pt brought back to room 33 from 18. Lunch at bedside and pt is calm and cooperative at this time.

## 2015-09-29 ENCOUNTER — Other Ambulatory Visit: Payer: Self-pay | Admitting: Nurse Practitioner

## 2015-09-29 DIAGNOSIS — K219 Gastro-esophageal reflux disease without esophagitis: Secondary | ICD-10-CM

## 2015-09-29 DIAGNOSIS — F19950 Other psychoactive substance use, unspecified with psychoactive substance-induced psychotic disorder with delusions: Secondary | ICD-10-CM

## 2015-09-29 DIAGNOSIS — F22 Delusional disorders: Secondary | ICD-10-CM | POA: Insufficient documentation

## 2015-09-29 MED ORDER — DIPHENHYDRAMINE HCL 50 MG/ML IJ SOLN
25.0000 mg | Freq: Once | INTRAMUSCULAR | Status: AC
  Administered 2015-09-29: 25 mg via INTRAMUSCULAR
  Filled 2015-09-29: qty 1

## 2015-09-29 MED ORDER — OMEPRAZOLE 40 MG PO CPDR: 40.0000 mg | DELAYED_RELEASE_CAPSULE | Freq: Every day | ORAL | Status: AC

## 2015-09-29 MED ORDER — STERILE WATER FOR INJECTION IJ SOLN
INTRAMUSCULAR | Status: AC
  Administered 2015-09-29: 10 mL
  Filled 2015-09-29: qty 10

## 2015-09-29 MED ORDER — PANTOPRAZOLE SODIUM 40 MG PO TBEC
40.0000 mg | DELAYED_RELEASE_TABLET | Freq: Every day | ORAL | Status: DC
  Administered 2015-09-29: 40 mg via ORAL
  Filled 2015-09-29 (×2): qty 1

## 2015-09-29 MED ORDER — QUETIAPINE FUMARATE 25 MG PO TABS: 25.0000 mg | ORAL_TABLET | Freq: Two times a day (BID) | ORAL | Status: DC

## 2015-09-29 MED ORDER — ZIPRASIDONE MESYLATE 20 MG IM SOLR
10.0000 mg | Freq: Once | INTRAMUSCULAR | Status: AC
  Administered 2015-09-29: 10 mg via INTRAMUSCULAR
  Filled 2015-09-29: qty 20

## 2015-09-29 NOTE — ED Notes (Signed)
On admission to the unit pt is intrusive with other patients, touching them inappropriately, and making demands of them. Ex: "You Taco, go to your room." She does not redirect and blames staff appearing to have some persecutory delusions. Discussed this behavior with Dr. [name].

## 2015-09-29 NOTE — ED Notes (Signed)
Remains paranoid. Believes staff has discharge papers and are hiding them from her. Continues to say she is ready to go.

## 2015-09-29 NOTE — ED Notes (Signed)
1:1 sitter within sight.

## 2015-09-29 NOTE — ED Notes (Signed)
Patient demanding to make phone call to "heather" at the cancer center. Patient was advised she may make three phone calls per day between 9am and 9pm. Patient demanded this nurse to call the cancer center "because I want to talk to Nira Conn now, and you have to call her." Patient request was declined as there is no emergent reason to call the cancer center at this time. Patient threatening to have this nurse fired as well as the "the other one that was here" as we aren't doing our job. Patient returned to her room where she continues to rant loudly about nurses not doing what they are supposed to and not letting her call her doctor. Sitter remains at bedside.

## 2015-09-29 NOTE — ED Notes (Signed)
Patient entering other patient rooms, trying to wake them.   Redirected.

## 2015-09-29 NOTE — Progress Notes (Signed)
CSW was notified by TTS disposition that IVC paperwork was completed by psychiatrist but needed to be notarized. CSW met with ED secretary who notarized document.  CSW faxed papers to evening magistrate who states he received the document. CSW logged the document in the IVC chart  Gloria Lang [phone] ED CSW 09/29/2015 5:12 PM

## 2015-09-29 NOTE — ED Notes (Signed)
Pt tried to wake another pt up while he was sleeping. The pt's door was closed and she opened it and yelled into his room to try to wake him up. Pt had to be redirected away from the pt's room.

## 2015-09-29 NOTE — ED Notes (Signed)
Pt went in to another pt room. Pt stated that she could go anywhere she wanted to go because the pt was her brother. Pt had to be redirected several times.

## 2015-09-29 NOTE — Progress Notes (Signed)
CSW was notified by TTS disposition that rowan requested updated note regarding pt behavior. CSW faxed new note to [place].  Willette Brace [id number] ED CSW 09/29/2015 5:13 PM

## 2015-09-29 NOTE — ED Notes (Signed)
Patient repeatedly talking loudly and difficult to redirect. Patient demanding to be given gloves and "them purple things" so that she may clean her room. Requests were denied. Patient states "youre the next one [place] here cause all you wanna do is read a chart, go ahead read it, the doctor says it's ok, they gave it to me before". Reviewed patient's chart, no indication of patient being allowed to have gloves or cleaning supplies, declined patient's request. Patient then walked to nurses station began pointing at this nurse saying "Jesus, Jesus, Jesus". If these other patient's wake up its because God wants them too. Patient appears hyper-religious at this time.

## 2015-09-29 NOTE — Progress Notes (Signed)
CSW spoke with [facility] admissions. Patient is under review at facility.   Willette Brace [id number] ED CSW 09/29/2015 9:27 PM

## 2015-09-29 NOTE — ED Notes (Signed)
MD at bedside.  Dr [name]

## 2015-09-29 NOTE — ED Notes (Signed)
Patient demanding, aggressive, threatening Probation officer.

## 2015-09-29 NOTE — Consult Note (Signed)
Psychiatric Specialty Exam: Physical Exam  ROS  Blood pressure 128/63, pulse 117, temperature 97.9 F (36.6 C), temperature source Oral, resp. rate 20, SpO2 100 %.There is no weight on file to calculate BMI.  General Appearance: Casual  Eye Contact: Good  Speech: Normal Rate  Volume: Normal  Mood: Anxious  Affect: Congruent  Thought Process: Coherent  Orientation: Full (Time, Place, and Person)  Thought Content: Delusions and Hallucinations: Auditory Visual  Suicidal Thoughts: No  Homicidal Thoughts: No  Memory: Immediate; Fair Recent; Fair Remote; Fair  Judgement: Impaired  Insight: Fair  Psychomotor Activity: Decreased  Concentration: Concentration: Fair and Attention Span: Fair  Recall: [employer] of Knowledge: Good  Language: Good  Akathisia: No  Handed: Right  AIMS (if indicated):    Assets: Housing Leisure Time Resilience Social Support  ADL's: Intact  Cognition: Impaired, Mild         Pt remains pleasantly confused today.  She is asking to be discharged home to go shower with her "olay soap"  Patient states people are jealous of her because she has higher power.  No known agitation or aggression today.  She is eating and drinking and is compliant with her medications.  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  Charmaine Downs   PMHNP-BC

## 2015-09-29 NOTE — ED Notes (Signed)
Patient again redirected from another patient's room. States "I don't have to leave anyone alone". FOI, tangential. Pacing and making religious statements, paranoid states "Gloria Lang had another patient put Ativan on me with this water".

## 2015-09-29 NOTE — ED Notes (Signed)
Patient appears agitated. States she is ready to go home. "I am tired of being mistreated. I am suppose to leave at 7."

## 2015-09-29 NOTE — ED Notes (Signed)
Refusing PO medications

## 2015-09-29 NOTE — ED Notes (Signed)
NP Heather from cancer center & RN at bedside.

## 2015-09-29 NOTE — ED Notes (Signed)
Pt currently knocking on exit doors and peaking trough the space in between the doors. Pt is pacing and talking about a nurse is going to give another pt medicine.

## 2015-09-29 NOTE — ED Notes (Signed)
Patient ambulatory about the unit, redirected to her room, states "im going to talk to the nurse", approaches nurse, continues to demand the cancer center be called, that "youre all gonna be fired, aint nobody doing their jobs", patient request to call cancer center was again declined. Patient returned to room where she slammed the door. Door was opened and patient was informed her door must remain open. Patient sobbing, and ranting "it aint' right, I want to go home, go get that doctor, I want my discharge papers".

## 2015-09-30 ENCOUNTER — Other Ambulatory Visit: Payer: Self-pay | Admitting: Nurse Practitioner

## 2015-09-30 MED ORDER — LORAZEPAM 2 MG/ML IJ SOLN: 1.0000 mg | Freq: Once | INTRAMUSCULAR | Status: DC

## 2015-09-30 MED ORDER — LORAZEPAM 2 MG/ML IJ SOLN
1.0000 mg | Freq: Once | INTRAMUSCULAR | Status: AC
  Administered 2015-09-30: 1 mg via INTRAMUSCULAR
  Filled 2015-09-30: qty 1

## 2015-09-30 NOTE — ED Notes (Signed)
Alert. Intrusive. Paranoid.

## 2015-09-30 NOTE — ED Notes (Signed)
Pt verbally hostile, accusing staff of "stealing drugs", states "I signed my discharge papers yesterday at 9:30 am and I was supposed to leave at 2pm, I bout to turn this place upside down", NP Josephine notified, Ativan Im ordered, pt states "I am not taking no ativan, y'all need to check that needle and make sure", security called to bedside, patient took IM in right deltoid with security present.

## 2015-09-30 NOTE — ED Notes (Signed)
Attempted to call report to [facility] RN, RN's not available at this time, Probation officer will call back.

## 2015-09-30 NOTE — ED Notes (Signed)
Pt transported by [facility] to [place], belongings sent with patient, pt refused pre transport vital signs.

## 2015-09-30 NOTE — ED Notes (Signed)
Patient accepted to [facility] per Raynelle Chary, RN . Patient is accepted to the services of Dr. Marylou Flesher to room [place] after 9:30 am. The number for report is [phone].

## 2015-09-30 NOTE — ED Notes (Signed)
Message left with sheriff [phone] that patient will need transport to [facility] after 0930.

## 2015-09-30 NOTE — ED Notes (Signed)
Alert. Reports she wants to transfer to [facility] hospital. Demanding ambulance and transfer. States she doesn't feel well. Denies all offered interventions. Escalating herself.

## 2015-09-30 NOTE — ED Notes (Signed)
FOI, tangential

## 2015-10-01 ENCOUNTER — Other Ambulatory Visit: Payer: BLUE CROSS/BLUE SHIELD

## 2015-10-01 ENCOUNTER — Ambulatory Visit: Payer: BLUE CROSS/BLUE SHIELD | Admitting: Nurse Practitioner

## 2015-10-02 ENCOUNTER — Other Ambulatory Visit: Payer: BLUE CROSS/BLUE SHIELD

## 2015-10-06 ENCOUNTER — Other Ambulatory Visit: Payer: Self-pay | Admitting: Nurse Practitioner

## 2015-10-08 ENCOUNTER — Ambulatory Visit: Payer: BLUE CROSS/BLUE SHIELD | Admitting: Nurse Practitioner

## 2015-10-08 ENCOUNTER — Ambulatory Visit: Payer: BLUE CROSS/BLUE SHIELD

## 2015-10-08 ENCOUNTER — Other Ambulatory Visit: Payer: BLUE CROSS/BLUE SHIELD

## 2015-10-08 ENCOUNTER — Telehealth: Payer: Self-pay | Admitting: Nurse Practitioner

## 2015-10-08 ENCOUNTER — Other Ambulatory Visit: Payer: Self-pay | Admitting: Nurse Practitioner

## 2015-10-08 NOTE — Telephone Encounter (Signed)
appt made and avs to be printed after visit

## 2015-10-09 ENCOUNTER — Telehealth: Payer: Self-pay

## 2015-10-09 ENCOUNTER — Encounter (HOSPITAL_COMMUNITY): Payer: Self-pay | Admitting: General Surgery

## 2015-10-09 NOTE — OR Nursing (Signed)
Out of Phase II time changed to reflect correct time.

## 2015-10-09 NOTE — Telephone Encounter (Signed)
The employer of pt called wanting to s/w Nira Conn about the pt. She has called in the past. The employer, Margo Aye, was told we cannot talk to her about the pt due to pt confidentiality.

## 2015-10-12 ENCOUNTER — Other Ambulatory Visit: Payer: Self-pay | Admitting: Nurse Practitioner

## 2015-10-13 ENCOUNTER — Other Ambulatory Visit: Payer: Self-pay | Admitting: Nurse Practitioner

## 2015-10-13 ENCOUNTER — Telehealth: Payer: Self-pay | Admitting: Nurse Practitioner

## 2015-10-13 DIAGNOSIS — B370 Candidal stomatitis: Secondary | ICD-10-CM

## 2015-10-13 MED ORDER — NYSTATIN 100000 UNIT/ML MT SUSP
5.0000 mL | Freq: Four times a day (QID) | OROMUCOSAL | Status: AC
Start: 2015-10-13 — End: ?

## 2015-10-13 MED ORDER — FLUCONAZOLE 100 MG PO TABS
100.0000 mg | ORAL_TABLET | Freq: Every day | ORAL | Status: AC
Start: 2015-10-13 — End: ?

## 2015-10-13 NOTE — Telephone Encounter (Signed)
Spoke with patient. Discharged from [facility] psychiatric facility late last week. Does not plan to return to [facility]. "I don't trust yall folk in [place]. You had me sent to that hellish place. You did me dirty." She is reportedly seeking care at [facility]. When enquired about the name of the physician, she was not able to answer, but states that "he is very good" and she will call back with that info and where her chart ould be routed. She asked that the psych reports not be included in that fax. She contests there is nothing wrong with her.   Patient requested refills of fluconazole and an antifungal rinse for her thrush to be sent to a [facility] in [place], [place]. This was done today.   Will reach back out to patient this week.

## 2015-10-15 ENCOUNTER — Ambulatory Visit: Payer: Self-pay

## 2015-10-15 ENCOUNTER — Telehealth (HOSPITAL_COMMUNITY): Payer: Self-pay | Admitting: Vascular Surgery

## 2015-10-15 ENCOUNTER — Other Ambulatory Visit: Payer: Self-pay

## 2015-10-15 ENCOUNTER — Ambulatory Visit: Payer: Self-pay | Admitting: Nurse Practitioner

## 2015-10-15 NOTE — Telephone Encounter (Signed)
Contacted pt to make 3 month f/u .Marland Kitchen Pt states she is back in [place] she will not be making any appt in [place]

## 2015-10-16 ENCOUNTER — Ambulatory Visit: Payer: Self-pay | Admitting: Oncology

## 2015-10-16 ENCOUNTER — Telehealth: Payer: Self-pay | Admitting: *Deleted

## 2015-10-16 NOTE — Telephone Encounter (Signed)
Received call from pt. Pt wanted to make sure I got the information on yesterday about the [facility] in [place] where she will finish her treatments and establish cancer care. I told her I forwarded info to our medical records to transfer the records. Pt states, " I'm doing better and taking my medications like I'm supposed too". Told pt to call this nurse if she has any issues with this.

## 2015-10-17 ENCOUNTER — Inpatient Hospital Stay
Admit: 2015-10-17 | Discharge: 2015-10-17 | Disposition: A | Discharge status: Home / Self Care | Payer: BLUE CROSS/BLUE SHIELD | Attending: Emergency Medicine

## 2015-10-17 DIAGNOSIS — J209 Acute bronchitis, unspecified: Secondary | ICD-10-CM

## 2015-10-17 LAB — DRUG SCREEN, URINE
AMPHETAMINES: NEGATIVE
BARBITURATES: NEGATIVE
BENZODIAZEPINES: NEGATIVE
COCAINE: NEGATIVE
METHADONE: NEGATIVE
OPIATES: NEGATIVE
PCP(PHENCYCLIDINE): NEGATIVE
THC (TH-CANNABINOL): NEGATIVE

## 2015-10-17 MED ORDER — AMOXICILLIN 500 MG TABLET
500 mg | ORAL_TABLET | Freq: Three times a day (TID) | ORAL | 0 refills | Status: DC
Start: 2015-10-17 — End: 2017-01-11

## 2015-10-17 NOTE — ED Notes (Signed)
I have reviewed discharge instructions with the patient.  The patient verbalized understanding.

## 2015-10-17 NOTE — ED Notes (Signed)
Pt arrives to ER asking to be "tested" for drugs.  Pt states she believes her family member replaced her OTC mucinex with hydrocodone.

## 2015-10-17 NOTE — ED Provider Notes (Signed)
HPI Comments: 6:40 AM Kimberly [name] is a 55 y.o. female with a h/o breast cancer presents to the ED with c/o that her family changed her mucinex with hydrocodone. Pt wanted to be checked for opiate toxicity. All other sx denied. No other complaints at this time.         PCP-PROVIDER UNKNOWN      Patient is a 55 y.o. female presenting with other event. The history is provided by the patient.   Other   Pertinent negatives include no chest pain, no abdominal pain, no headaches and no shortness of breath.        Past Medical History:   Diagnosis Date   ??? Cancer [facility])        History reviewed. No pertinent surgical history.      History reviewed. No pertinent family history.    Social History     Social History   ??? Marital status: MARRIED     Spouse name: N/A   ??? Number of children: N/A   ??? Years of education: N/A     Occupational History   ??? Not on file.     Social History Main Topics   ??? Smoking status: Never Smoker   ??? Smokeless tobacco: Not on file   ??? Alcohol use No   ??? Drug use: No   ??? Sexual activity: Not on file     Other Topics Concern   ??? Not on file     Social History Narrative   ??? No narrative on file         ALLERGIES: Ativan [lorazepam]    Review of Systems   Constitutional: Negative for chills and fever.   HENT: Negative for sore throat.    Eyes: Negative for redness.   Respiratory: Negative for cough, shortness of breath and wheezing.    Cardiovascular: Negative for chest pain.   Gastrointestinal: Negative for abdominal pain, diarrhea, nausea and vomiting.   Genitourinary: Negative for dysuria.   Musculoskeletal: Negative for neck stiffness.   Skin: Negative for pallor.   Neurological: Negative for headaches.   Hematological: Does not bruise/bleed easily.   All other systems reviewed and are negative.      Vitals:    10/17/15 0451   BP: 141/68   Pulse: (!) 101   Resp: 18   Temp: 98.2 ??F (36.8 ??C)   SpO2: 100%   Weight: 67.1 kg (148 lb)   Height: 5' (1.524 m)            Physical Exam    Constitutional: She is oriented to person, place, and time. She appears well-developed and well-nourished. No distress.   HENT:   Head: Normocephalic.   Right Ear: External ear normal.   Left Ear: External ear normal.   Mouth/Throat: No oropharyngeal exudate.   Eyes: Conjunctivae and EOM are normal. Pupils are equal, round, and reactive to light. Right eye exhibits no discharge. Left eye exhibits no discharge. No scleral icterus.   Neck: Normal range of motion. Neck supple. No JVD present. No tracheal deviation present. No thyromegaly present.   Cardiovascular: Normal rate, regular rhythm, normal heart sounds and intact distal pulses.  Exam reveals no gallop and no friction rub.    No murmur heard.  Pulmonary/Chest: Effort normal and breath sounds normal. No stridor. No respiratory distress. She has no wheezes. She has no rales. She exhibits no tenderness.   Abdominal: Soft. Bowel sounds are normal. She exhibits no distension and no mass. There  is no tenderness. There is no rebound and no guarding.   Musculoskeletal: Normal range of motion. She exhibits no edema or tenderness.   Lymphadenopathy:     She has no cervical adenopathy.   Neurological: She is alert and oriented to person, place, and time. She displays normal reflexes. No cranial nerve deficit. She exhibits normal muscle tone. Coordination normal.   Skin: Skin is warm and dry. No rash noted. She is not diaphoretic. No erythema. No pallor.   Nursing note and vitals reviewed.       MDM  Number of Diagnoses or Management Options  Acute bronchitis, unspecified organism:   Encounter for drug screening:   Diagnosis management comments: 6:41 AM  PO TCL 272. Pt UDS negative for opiates. Pt also states cough for 3 days. Pt talking with [facility] on her cell phone upon being evaluated.     Dx: Bronchitis, and Acute Anxiety.     ED Course       Procedures  Vitals:  Patient Vitals for the past 12 hrs:   Temp Pulse Resp BP SpO2    10/17/15 0451 98.2 ??F (36.8 ??C) (!) 101 18 141/68 100 %         Medications ordered:   Medications - No data to display      Lab findings:  Recent Results (from the past 12 hour(s))   DRUG SCREEN, URINE    Collection Time: 10/17/15  4:56 AM   Result Value Ref Range    BENZODIAZEPINE NEGATIVE  NEG      BARBITURATES NEGATIVE  NEG      THC (TH-CANNABINOL) NEGATIVE  NEG      OPIATES NEGATIVE  NEG      PCP(PHENCYCLIDINE) NEGATIVE  NEG      COCAINE NEGATIVE  NEG      AMPHETAMINE NEGATIVE  NEG      METHADONE NEGATIVE  NEG      HDSCOM (NOTE)        Disposition:  Diagnosis:   1. Acute bronchitis, unspecified organism    2. Encounter for drug screening        Disposition: discharge    Follow-up Information     Follow up With Details Comments [place] In 3 days  [place] [place]  [phone]            Discharge Medication List as of 10/17/2015  5:33 AM      START taking these medications    Details   amoxicillin 500 mg tab Take 500 mg by mouth three (3) times daily. Indications: CHRONIC BRONCHITIS WITH BACTERIAL EXACERBATION, Print, Disp-30 Tab, R-0         CONTINUE these medications which have NOT CHANGED    Details   nystatin (MYCOSTATIN) 100,000 unit/mL suspension Take  by mouth four (4) times daily. swish and spit, Historical Med      fluconazole (DIFLUCAN) 100 mg tablet Take 200 mg by mouth daily. FDA advises cautious prescribing of oral fluconazole in pregnancy., Historical Med      valACYclovir (VALTREX) 500 mg tablet Take 500 mg by mouth two (2) times a day., Historical Med      dexamethasone (DECADRON) 4 mg tablet Take  by mouth every twelve (12) hours., Historical Med      diphenhydrAMINE (BENADRYL) 25 mg tablet Take 25 mg by mouth every six (6) hours as needed for Sleep., Historical Med  lidocaine-prilocaine (EMLA) topical cream Apply  to affected area as needed for Pain., Historical Med       loratadine (CLARITIN) 10 mg tablet Take 10 mg by mouth., Historical Med      therapeutic multivitamin-minerals (THERAGRAN-M) tablet Take 1 Tab by mouth daily., Historical Med      ondansetron hcl (ZOFRAN) 8 mg tablet Take 8 mg by mouth every eight (8) hours as needed for Nausea., Historical Med      prochlorperazine (COMPAZINE) 10 mg tablet Take 5 mg by mouth every six (6) hours as needed., Historical Med      zolpidem (AMBIEN) 5 mg tablet Take  by mouth nightly as needed for Sleep., Historical Med           Scribe Attestation  David Martinique scribing for and in the presence of Glean Salvo, MD 6:43 AM, 10/17/15.    Physician Attestation  I personally performed the services described in the documentation, reviewed the documentation, as recorded by the scribe in my presence, and it accurately and completely records my words and actions.    Glean Salvo, MD 6:43 AM 10/17/15        Signed by : David Martinique, Scribe, 10/17/15 at 6:43 AM

## 2015-10-17 NOTE — ED Notes (Signed)
The pill the pt was stating was hydrocodone was looked up by RN.  The pill (TCL 272) was found to be guaifenesin.  Pt was informed of this and stated she did not believe that to be correct.  Pt repeatedly stated she would go to her cancer center and have her blood drawn.  Pts UDS was also negative.

## 2015-10-17 NOTE — ED Triage Notes (Signed)
Pt reporting having a cough recently and buying mucinex.  Reports that she feels a family member swapped the mucinex pills in her bottle.  Wants to be tested to see if she has hydrocodone in her system.

## 2015-10-19 ENCOUNTER — Other Ambulatory Visit: Payer: Self-pay | Admitting: Nurse Practitioner

## 2015-10-20 ENCOUNTER — Telehealth: Payer: Self-pay | Admitting: Nurse Practitioner

## 2015-10-20 ENCOUNTER — Telehealth: Payer: Self-pay | Admitting: Oncology

## 2015-10-20 NOTE — Telephone Encounter (Signed)
cxl appts per pt 6/13. transfering care to [place].

## 2015-10-20 NOTE — Telephone Encounter (Signed)
Per pt faxed medical records to [facility] [phone]

## 2015-10-22 ENCOUNTER — Ambulatory Visit: Payer: BLUE CROSS/BLUE SHIELD | Admitting: Nurse Practitioner

## 2015-10-22 ENCOUNTER — Other Ambulatory Visit: Payer: BLUE CROSS/BLUE SHIELD

## 2015-10-22 ENCOUNTER — Ambulatory Visit: Payer: BLUE CROSS/BLUE SHIELD

## 2016-02-04 ENCOUNTER — Other Ambulatory Visit: Payer: Self-pay | Admitting: Oncology

## 2016-02-04 NOTE — Progress Notes (Signed)
[place]  Telephone:([phone]) [phone] Fax:(336) [phone]     ID: Gloria Lang DOB: 04-08-1961  MR#: [id number]  GQQ#:[id number]  Patient Care Team: Everardo Beals, NP as PCP - General Chauncey Cruel, MD as Consulting Physician (Oncology) Kyung Rudd, MD as Consulting Physician (Radiation Oncology) Stark Klein, MD as Consulting Physician (General Surgery) PCP: Imelda Pillow, NP GYN: OTHER MD:  CHIEF COMPLAINT: Triple negative breast cancer  CURRENT TREATMENT: Neoadjuvant chemotherapy  BREAST CANCER HISTORY: From the original intake note:  Gloria Lang started experiencing pain in her left breast about a month prior to bringing it to medical attention. The pain would, go but overall it tended to be getting worse. There was no obvious mass or discharge. She finally brought it to [employer] attention in Dr. Ellie Lunch office and he was able to palpate a mass. He set the patient up for bilateral diagnostic mammography with tomography and left breast ultrasonography at the [facility] 07/30/2015. The breast density was category D. In the upper-outer quadrant of the left breast there was a spiculated mass which was palpable at the 1:30 o'clock location 10 cm from the nipple. There was no palpable axillary adenopathy. Ultrasound confirmed an irregular hypoechoic mass in the area just mentioned measuring 2.4 cm. The left axilla showed 2 adjacent lymph nodes with cortical thickening.  On 08/05/2015 Gloria Lang underwent biopsy of the left breast mass and also of one of the left axillary lymph nodes in question. The final pathology ([id number]) showed both biopsies to be involved by invasive mammary carcinoma, which proved to be E-cadherin positive and therefore consistent with a ductal phenotype. Prognostic panels were run on both biopsies. Both were estrogen and progesterone receptor negative. The MIB-1 of the breast mass was 10% but that of the lymph node mass was 60%. HER-2 was  negative and both cases, with a signals ratio is between 1.63 and 1.71, and number per cell between 1.95 and 2.05.  Her subsequent history is as detailed below  INTERVAL HISTORY: Gloria Lang returns for follow up of her left-sided breast cancer, alone. Today is day 1, cycle 3 of 4 planned cycles of doxorubicin and cyclophosphamide, with neulasta onpro given on day 2 for granulocyte support. The interval history is remarkable for a 16 hour involuntary commitment at [facility] after patient presented to ED with paranoid behavior. She reported being poisoned by neighbors, both from food and "toxic" chemicals into her air vent. She was verbally abusive with staff, refused labs and food. She was given one dose of IM ativan (against her will, per patient) and she is still upset about it today. I visited her yesterday and she is essentially in the same metal state today. She is anxious, paranoid, speaking rapidly, with a flight of ideas that do not always connect to the previous statement. She will answer questions, but quickly changes the subject. She refuses any anti-psychotic meds, insisting that she is fine and a "[place] woman." She was discharged with a prescription for seroquel, but she refuses to fill it. The cause of her behavior has been suggested to be related to recent steroid use for chemotherapy. She is well groomed and polite today. Despite her speech, she is alert and oriented x 4. She just wants to restart treatment as planned.   REVIEW OF SYSTEMS: Gloria Lang is having a few episodes of diarrhea daily, both before and after her admission to the hospital. She is not interested in imodium use. Her main complaint today is a sore  throat that keeps her from swallowing well. She feels like food keeps getting stuck halfway down her esophagus. She denies fevers or chills. She has some mild nausea, and has not eaten much since yesterday, but she "knows what to do for this." She did not sleep "at all" last  night. She denies mouth sores, rashes or neuropathy symptoms. She is in no pain. She denies headaches, dizziness, or vision changes. A detailed review of systems is otherwise stable.  PAST MEDICAL HISTORY: Past Medical History:  Diagnosis Date  . Anemia   . Arthritis   . Asthma    only in the summer time- uses albuterol inhaler  . Breast cancer of upper-outer quadrant of left female breast ([place]) 08/07/2015  . Complication of anesthesia    patient states she has coded multiple times before with anesthesia. her sister has similar issues.   . Family history of adverse reaction to anesthesia    younger sister has same issues with anesthesia  . GERD (gastroesophageal reflux disease)   . History of hiatal hernia    had hiatal hernia repair in '94 (per patient)  . Hot flashes   . Hx of peptic ulcer   . Hx of transfusion of packed red blood cells   . Shortness of breath dyspnea    if patient's head is flat (per pt)  . Sleep apnea    couldn't tolerate CPAP d/t claustrophobia; sleep upright "90 degree angle": study done at [facility] several years ago    PAST SURGICAL HISTORY: Past Surgical History:  Procedure Laterality Date  . ABDOMINAL HYSTERECTOMY    . COLONOSCOPY    . HERNIA REPAIR    . PORTACATH PLACEMENT N/A 08/20/2015   Procedure: INSERTION PORT-A-CATH;  Surgeon: Stark Klein, MD;  Location: [facility];  Service: General;  Laterality: N/A;  . TUBAL LIGATION    . UPPER GI ENDOSCOPY      FAMILY HISTORY Family History  Problem Relation Age of Onset  . Colon cancer Brother 47  . Throat cancer Maternal Grandfather   . Lung cancer Maternal Uncle   . Ovarian cancer Sister 83    suspected but not pathologically confirmed  . Lung cancer Paternal Uncle   . Breast cancer Maternal Aunt 40  The patient's father died at age 46, from causes not related to cancer. The patient's mother is age 15 as of April 2017. The patient has 2 brothers, 3 sisters. The patient has 1 aunt diagnosed  with breast cancer in her 57s, a paternal grandfather and an uncle with throat cancer, a brother with colon cancer diagnosed in his 53s, and an uncle on the paternal side with lung cancer.   GYNECOLOGIC HISTORY:  No LMP recorded. Patient has had a hysterectomy.  Menarche age 59, first live birth age 11. She is GX P2. The patient underwent total abdominal hysterectomy with bilateral salpingo-oophorectomy for fibroids. However she tells me evaluation 2 years after that surgery showed that she still had a uterus and ovaries. She never called the surgeon ( in [place]) back to discuss this because she says he has retired.   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 At home she lives with her sister Shelton Silvas and 2 grandchildren, aged 49 and 48. The patient's children are  [employer], lives in [place] and works as a Librarian, academic in a plant,  and e [employer], lives in [place] and is disabled. The patient has a total of 13 grandchildren and one great-grandchild. She attends  a local [employer]    ADVANCED DIRECTIVES:  not in place    HEALTH MAINTENANCE: Social History  Substance Use Topics  . Smoking status: Never Smoker  . Smokeless tobacco: Not on file  . Alcohol use No     Colonoscopy: 2006?  PAP: 2014  Bone density:  Lipid panel:  Allergies  Allergen Reactions  . Ativan [Lorazepam]     Pt becomes drowsy. Does not like the feeling of being sleepy.     Current Outpatient Prescriptions  Medication Sig Dispense Refill  . diphenhydrAMINE (BENADRYL) 25 mg capsule Take 25 mg by mouth daily as needed for allergies.     . fluconazole (DIFLUCAN) 100 MG tablet Take 1 tablet (100 mg total) by mouth daily. (Patient not taking: Reported on 09/28/2015) 7 tablet 0  . fluconazole (DIFLUCAN) 100 MG tablet Take 1 tablet (100 mg total) by mouth daily. 30 tablet 0  . lidocaine-prilocaine (EMLA) cream Apply to affected area once 30 g 3  . loratadine (CLARITIN) 10 MG tablet Take  1 tablet (10 mg total) by mouth daily. (Patient taking differently: Take 10 mg by mouth daily as needed for allergies (leg pain). ) 30 tablet 1  . Multiple Vitamin (MULTIVITAMIN WITH MINERALS) TABS tablet Take 1 tablet by mouth daily.    Marland Kitchen nystatin (MYCOSTATIN) 100000 UNIT/ML suspension Take 5 mLs (500,000 Units total) by mouth 4 (four) times daily. 240 mL 0  . omeprazole (PRILOSEC) 40 MG capsule Take 1 capsule (40 mg total) by mouth daily. 30 capsule 1  . ondansetron (ZOFRAN) 8 MG tablet Take 1 tablet (8 mg total) by mouth 2 (two) times daily as needed. Start on the third day after chemotherapy. (Patient not taking: Reported on 09/28/2015) 30 tablet 1  . valACYclovir (VALTREX) 500 MG tablet Take 1 tablet (500 mg total) by mouth daily. 20 tablet 0  . zolpidem (AMBIEN) 5 MG tablet Take 1-2 tablets (5-10 mg total) by mouth at bedtime as needed for sleep. 30 tablet 0   No current facility-administered medications for this visit.     OBJECTIVE: Middle-aged African-American woman in no acute distress  There were no vitals filed for this visit.   There is no height or weight on file to calculate BMI.    ECOG FS:0 - Asymptomatic   Sclerae unicteric, pupils round and equal Oropharynx clear and moist-- no thrush or other lesions No cervical or supraclavicular adenopathy Lungs no rales or rhonchi Heart regular rate and rhythm Abd soft, nontender, positive bowel sounds MSK no focal spinal tenderness, no upper extremity lymphedema Neuro: nonfocal, agitated affect, rapid and tangential speech, flight of ideas, oriented x4 Breasts:   LAB RESULTS:  CMP     Component Value Date/Time   NA 136 09/27/2015 1432   NA 137 09/10/2015 0950   K 3.6 09/27/2015 1432   K 4.4 09/10/2015 0950   CL 104 09/27/2015 1432   CO2 23 09/27/2015 1432   CO2 27 09/10/2015 0950   GLUCOSE 112 (H) 09/27/2015 1432   GLUCOSE 127 09/10/2015 0950   BUN 11 09/27/2015 1432   BUN 12.3 09/10/2015 0950   CREATININE 0.75  09/27/2015 1432   CREATININE 0.9 09/10/2015 0950   CALCIUM 9.4 09/27/2015 1432   CALCIUM 9.8 09/10/2015 0950   PROT 6.7 09/27/2015 1432   PROT 6.9 09/10/2015 0950   ALBUMIN 4.4 09/27/2015 1432   ALBUMIN 4.1 09/10/2015 0950   AST 42 (H) 09/27/2015 1432   AST 24 09/10/2015 0950  ALT 48 09/27/2015 1432   ALT 28 09/10/2015 0950   ALKPHOS 127 (H) 09/27/2015 1432   ALKPHOS 125 09/10/2015 0950   BILITOT 0.6 09/27/2015 1432   BILITOT <0.30 09/10/2015 0950   GFRNONAA >60 09/27/2015 1432   GFRAA >60 09/27/2015 1432    INo results found for: SPEP, UPEP  Lab Results  Component Value Date   WBC 81.8 (HH) 09/27/2015   NEUTROABS 78.6 (H) 09/27/2015   HGB 9.5 (L) 09/27/2015   HCT 27.7 (L) 09/27/2015   MCV 91.4 09/27/2015   PLT 399 09/27/2015      Chemistry      Component Value Date/Time   NA 136 09/27/2015 1432   NA 137 09/10/2015 0950   K 3.6 09/27/2015 1432   K 4.4 09/10/2015 0950   CL 104 09/27/2015 1432   CO2 23 09/27/2015 1432   CO2 27 09/10/2015 0950   BUN 11 09/27/2015 1432   BUN 12.3 09/10/2015 0950   CREATININE 0.75 09/27/2015 1432   CREATININE 0.9 09/10/2015 0950      Component Value Date/Time   CALCIUM 9.4 09/27/2015 1432   CALCIUM 9.8 09/10/2015 0950   ALKPHOS 127 (H) 09/27/2015 1432   ALKPHOS 125 09/10/2015 0950   AST 42 (H) 09/27/2015 1432   AST 24 09/10/2015 0950   ALT 48 09/27/2015 1432   ALT 28 09/10/2015 0950   BILITOT 0.6 09/27/2015 1432   BILITOT <0.30 09/10/2015 0950       No results found for: LABCA2  No components found for: [id number]  No results for input(s): INR in the last 168 hours.  Urinalysis    Component Value Date/Time   COLORURINE YELLOW 09/27/2015 [place] 09/27/2015 1545   LABSPEC 1.005 09/27/2015 1545   PHURINE 6.0 09/27/2015 1545   GLUCOSEU NEGATIVE 09/27/2015 1545   HGBUR NEGATIVE 09/27/2015 1545   BILIRUBINUR NEGATIVE 09/27/2015 1545   KETONESUR NEGATIVE 09/27/2015 1545   PROTEINUR NEGATIVE  09/27/2015 1545   NITRITE NEGATIVE 09/27/2015 1545   LEUKOCYTESUR NEGATIVE 09/27/2015 1545     ELIGIBLE FOR AVAILABLE RESEARCH PROTOCOL: B 51, [facility]  STUDIES: No results found.  ASSESSMENT: 55 y.o. [place] woman status post left breast upper outer quadrant and left axillary lymph node biopsy 08/05/2015, both positive for an invasive ductal carcinoma, grade 2 or 3, triple negative, with an MIB-1 between 10 and 60%   (1) neoadjuvant chemotherapy consisting of doxorubicin and cyclophosphamide in dose dense fashion 4 started 08/27/2015, to be followed by weekly paclitaxel and carboplatin 12  (2) definitive surgery we will follow chemotherapy  (3) adjuvant radiation to follow as appropriate  (4) genetics testing scheduled for 09/17/2015  PLAN: Physically, Aunika is relatively stable. She is having recurrent episodes of diarrhea, and has refused to take imodium. I asked her to stay well hydrated at least. We are going to try prilosec 85m for her throat pain, that is probably actually reflux. The labs from her admission yesterday are stable. She will proceed with cycle 3 of cyclophosphamide and doxorubicin as planned.  Mentally, I cannot say that she is any better than she appeared yesterday morning. We discussed the prescription for seroquel that she was discharged home with, and she refuses. I called to inform GLoren Racer our social worker, regarding this case. She is going to visit the patient in the treatment room. I have told the patient on multiple occasions during our visit to discontinue use of dexamethasone at home, and I have removed the steroid from her  premed list for chemo today in case this was a contributing factor to her mental state.   Denissa will return in 1 week for labs and a nadir visit. She understands and agrees with this plan. She knows the goal of treatment in her case is cure. She has been encouraged to call with any issues that might arise before her next visit  here.  Chauncey Cruel, MD   02/04/2016 2:13 PM

## 2016-02-29 ENCOUNTER — Inpatient Hospital Stay: Admit: 2016-02-29 | Payer: BLUE CROSS/BLUE SHIELD | Primary: Internal Medicine

## 2016-02-29 DIAGNOSIS — C50812 Malignant neoplasm of overlapping sites of left female breast: Secondary | ICD-10-CM

## 2016-02-29 NOTE — Progress Notes (Signed)
[facility]  Oncology Progress Note  NAME:  Kimberly [name], Kimberly [name]  SEX:   F  DOB:   1961/04/16  DATE: 02/29/2016  REFERRING PHYSICIAN: JEFFREY Satira Anis  MR#    [id number]  LOCATION: RADIATION ONCOLOGY  BILLING#  [id number]    cc:     The patient today underwent initial consultation regarding left-sided breast cancer.  She stated a personal distress score of 3 and declined social service intervention today.      ___________________  Oliver Barre MD  Dictated By: .     D:02/29/2016 09:36:55  T: 02/29/2016  QQ:[phone]

## 2016-02-29 NOTE — Consults (Signed)
[facility]  Oncology Consultation  NAME:  TEDI, DENKINS  SEX:   F  DOB:   03/01/1961  DATE: 02/29/2016  REFERRING PHYSICIAN: JEFFREY Satira Anis  MR#    [id number]  LOCATION: RADIATION ONCOLOGY  BILLING#  [id number]    cc: Heath Lark MD; Riki Altes MD; Torrie Mayers MD    REFERRING PHYSICIANS:  Dr. Torrie Mayers, Dr. Heath Lark, Dr. Riki Altes.    DIAGNOSIS:  Infiltrating ductal carcinoma of the left breast status post neoadjuvant chemotherapy and bilateral mastectomies.    HISTORY OF PRESENT ILLNESS:  The patient is a 55 year old woman who was diagnosed with a palpable mass in the left breast while living in [place], [place].  At least 2 masses were noted in the left breast biopsy which was positive for infiltrating ductal carcinoma.  Reportedly, biopsy of the left axilla was also positive for malignancy.  She began neoadjuvant chemotherapy in [place], [place], receiving 3 of 4 planned courses.  Her medical condition worsened after the third course requiring hospitalization.  She then moved to [place] to be with family.  Fourth cycle of preoperative chemotherapy was delivered by Dr. Ples Specter.  On 01/27/16 she was taken to the operating room for right mastectomy with sentinel node sampling, which was benign, and left mastectomy with tissue expander placement followed by sentinel lymph node and axillary node dissection.  Pathology showed multifocal infiltrating ductal carcinoma with metaplastic features in the left breast.  At least two 2.5 cm masses were noted in the lower outer quadrant and lower inner quadrant.  Separate foci were noted and intralymphatic tumor emboli.  Seven of 15 axillary lymph nodes were positive for metastasis with positive extracapsular extension.  Tumor was triple negative at diagnosis and triple negative at mastectomy.  She had bilateral tissue expanders placed.  Plans are underway for postoperative  Taxol chemotherapy and she is referred for consideration of consolidative radiation therapy.    PAST MEDICAL HISTORY:  Negative for previous radiation therapy.  Preoperative and postoperative planned chemotherapy as above.  Also history of hernia repair, hysterectomy.    CURRENT MEDICATIONS:  The patient takes a multivitamin.    ALLERGIES:  INTOLERANCE TO ATIVAN.     FAMILY HISTORY:  No known family history of breast cancer.    REVIEW OF SYSTEMS:  The patient admits to fatigue following chemotherapy and surgery.  Denies visual problem.  Denies hearing or swallowing problem.  Denies respiratory problem.  Denies cardiac problem.  Denies gastrointestinal problem.  Denies genitourinary problem.  Musculoskeletal complaints of mild joint pain, but no  specific point tenderness.  Denies neurologic problem.  Denies psychiatric problem.    PAIN AND PAIN MANAGEMENT:  The patient takes no pain medication at this time.    QUALITY OF LIFE EVALUATION:  The patient's current quality of life appears very good.     KARNOFSKY STATUS:   100%.    PHYSICAL EXAMINATION:  GENERAL:  The patient is a well-developed, well-nourished, adult female in no apparent distress.  VITAL SIGNS:  Afebrile, blood pressure 128/54, 146 pounds.  HEENT:  No evidence of scleral icterus.  No adenopathy in the neck or supraclavicular regions bilaterally.  The patient has early hair regrowth on the scalp following chemotherapy induced epilation.  CHEST:  Clear to auscultation and percussion.  No spinal tenderness or rib tenderness to percussion.  HEART:  Regular rate and rhythm.  BREASTS:  Right breast status post mastectomy with tissue expander placed.  Surgical scars are well healed.  No visible or palpable evidence of disease on the chest wall or in the right axilla.  Left breast also status post mastectomy with tissue expander placement.  Scars are well healed.  No visible or palpable evidence of disease in the remaining skin of the  reconstructed breast or in the left axilla.  She has good range of motion of the left shoulder.  MediPort catheter is noted in the left infraclavicular area.  ABDOMEN:  Benign.  EXTREMITIES:  No clubbing, cyanosis or edema.  NEUROLOGICAL:  Grossly intact.  The patient is awake, alert and oriented times 3 with no focal motor or sensory deficit.    LABORATORY STUDIES:  CBC 02/22/16:  WBC 4.3, hemoglobin 11, 381,000 platelets.  CT of the chest, abdomen and pelvis and bone scan performed preoperatively reportedly negative for metastases (official report pending at this time).  Review of pathology from bilateral mastectomy specimens 01/27/16 show right breast benign.  Left breast shows multifocal invasive mammary carcinoma with metaplastic features, grade 3.  at least two 2.5 cm foci noted.  Negative surgical margins.  Positive for intralymphatic tumor emboli noted in the breast near the nipple.  Seven of 15 axillary lymph nodes positive for metastatic disease with extracapsular extension.    ICD-10 CODE:  C50.812.    STAGE OF TUMOR  T2 N2 M0.    ASSESSMENT:  The patient is a 55 year old woman who is status post neoadjuvant chemotherapy followed by bilateral mastectomy for locally advanced left breast cancer.  With multifocal disease remaining in the breast after chemotherapy and intralymphatic tumor emboli, as well as heavy axillary lymph node infiltration, she is still at risk for microscopic disease on the chest wall and lymphatics.  I would recommend consolidative radiation therapy covering the reconstructed left breast, left chest wall and lymphatics in the left axilla and left supraclavicular region in an effort to maximize her chances for local tumor control.  She is scheduled to receive further chemotherapy with dose dense Taxol beginning 03/07/2016.  I will await completion of her Taxol and then plan radiation therapy afterwards.  Following this, she may be a candidate for completion  reconstruction as per Dr. Stevie Kern.  The risks and benefits of radiation therapy were discussed in detail with the patient.  All of her questions were answered to her satisfaction.  She appears to fully understand her current disease status and treatment options and appears very pleased with the way her workup is progressing.  I will await completion of Taxol chemotherapy and plan treatment accordingly.    I thank the referring physicians for allowing me to participate in the care of this very pleasant woman.    Total time spent with the patient approximately 45 minutes.      ___________________  Oliver Barre MD  Dictated By: .   JMB  D:02/29/2016 09:36:18  T: 02/29/2016  [id number]

## 2016-05-16 ENCOUNTER — Inpatient Hospital Stay: Admit: 2016-05-16 | Payer: PRIVATE HEALTH INSURANCE | Primary: Internal Medicine

## 2016-05-16 DIAGNOSIS — Z510 Encounter for antineoplastic radiation therapy: Secondary | ICD-10-CM

## 2016-05-16 NOTE — Progress Notes (Signed)
[facility]  Oncology Progress Note  NAME:  Trunnell, Iyannah  SEX:   F  DOB:   1960/07/08  DATE: 06/01/2016  REFERRING PHYSICIAN: JEFFREY Satira Anis  MR#    [id number]  LOCATION: RADIATION ONCOLOGY  BILLING#  [id number]    cc:     The patient was seen today in routine clinic on day 6 of planned 28 treatments plus 5 boost treatments to the entire left breast and lymphatics.  This totals 1080 cGy.  She has no complaints at this time.  On examination, skin within the x-ray portal shows no signs of radiation reaction.  She is applying moisturizers as directed.  Port films were checked for accuracy and treatment is to continue as planned.      ___________________  Oliver Barre MD  Dictated By: .   SC  D:06/01/2016 [id number]  T: 06/01/2016  [id number]

## 2016-05-16 NOTE — Progress Notes (Signed)
[facility]  Oncology Progress Note  NAME:  Kimberly [name], Kimberly [name]  SEX:   F  DOB:   10-17-1960  DATE: 06/08/2016  REFERRING PHYSICIAN: JEFFREY Satira Anis  MR#    [id number]  LOCATION: RADIATION ONCOLOGY  BILLING#  [id number]    cc:     The patient was seen in routine clinic today on day 11 of a planned 28 treatments plus 5 boost treatments to cover the entire reconstructed left chest wall and lymphatics.  This totals 1980 cGy.  She has no complaints at this time.  On examination, skin within the treatment portal shows no sign of radiation reaction.  No irritation or desquamation in the inframammary fold, axilla or supraclavicular regions.  Port films were checked for accuracy and treatment is to continue as planned.      ___________________  Oliver Barre MD  Dictated By: .   SB  D:06/08/2016 07:41:14  T: 06/08/2016  [id number]

## 2016-05-16 NOTE — Progress Notes (Signed)
[facility]  Oncology Progress Note  NAME:  [name], Kimberly  SEX:   F  DOB:   May 18, 1960  DATE: 05/25/2016  REFERRING PHYSICIAN: JEFFREY Satira Anis  MR#    [id number]  LOCATION: RADIATION ONCOLOGY  BILLING#  [id number]    cc:     The patient today has received 2 of planned 28 treatments to the left chest wall and lymphatics, to be followed by 5 boost treatments.  This totals 360 cGy.  She has no complaints.  Skin within the x-ray portal shows no sign of radiation reaction.  Port films were checked for accuracy and treatment is to continue as planned.      ___________________  Oliver Barre MD  Dictated By: .   NS  D:05/25/2016 15:20:45  T: 05/25/2016  [id number]

## 2016-06-09 ENCOUNTER — Inpatient Hospital Stay: Admit: 2016-06-09 | Payer: PRIVATE HEALTH INSURANCE | Primary: Internal Medicine

## 2016-06-09 DIAGNOSIS — Z510 Encounter for antineoplastic radiation therapy: Secondary | ICD-10-CM

## 2016-06-09 NOTE — Progress Notes (Signed)
[facility]  Oncology Progress Note  NAME:  Kimberly [name], Kimberly [name]  SEX:   F  DOB:   12/22/1960  DATE: 06/22/2016  REFERRING PHYSICIAN: JEFFREY Satira Anis  MR#    [id number]  LOCATION: RADIATION ONCOLOGY  BILLING#  [id number]    cc:     The patient was seen today in routine clinic day 21 of 28 planned treatments to the left reconstructed breast and lymph nodes.  This totals 3780 cGy.  On-table second simulation was done for electron scar boost.  Skin within the x-ray portal is hyperpigmented, but intact.  Port films were checked for accuracy and treatment is to continue as planned.      ___________________  Oliver Barre MD  Dictated By: .   SB  D:06/22/2016 07:33:19  T: 06/22/2016  [id number]

## 2016-06-09 NOTE — Progress Notes (Signed)
[facility]  Oncology Progress Note  NAME:  Kimberly [name], Kimberly [name]  SEX:   F  DOB:   1961-02-26  DATE: 07/06/2016  REFERRING PHYSICIAN: JEFFREY Satira Anis  MR#    [id number]  LOCATION: RADIATION ONCOLOGY  BILLING#  [id number]    cc:     The patient was seen today in routine clinic after completing 28 of 28 treatments to the left chest wall and lymphatics and 3 of 5 electron primary site boost fields.  This total 5040 cGy to the entire left breast and lymphatics and 600 cGy boost.  She has no complaints at this time.  On examination, skin within the x-ray portal is hyperpigmented with some dry flaking in the inframammary fold.  She is applying moisturizers as directed.  Port films were checked for accuracy and treatment is to continue as planned.      ___________________  Oliver Barre MD  Dictated By: .   SB  D:07/06/2016 07:17:20  T: 07/06/2016  [id number]

## 2016-06-09 NOTE — Progress Notes (Signed)
[facility]  Oncology Progress Note  NAME:  Kimberly [name], Kimberly [name]  SEX:   F  DOB:   [birth year]  DATE: 06/17/2016  REFERRING PHYSICIAN: JEFFREY Satira Anis  MR#    [id number]  LOCATION: RADIATION ONCOLOGY  BILLING#  [id number]    cc:     The patient has completed 3240 rads.  She has dry skin within the radiation treatment portal without desquamation.  She has no discomfort in the treated region.  She has no cough or shortness of breath.  She is eating well, maintaining her weight.  She has been given skin care instructions and her treatments will continue as planned.      ___________________  Evon Slack MD  Dictated By: .   SC  D:06/17/2016 [id number]  T: 06/17/2016  [id number]

## 2016-06-09 NOTE — Progress Notes (Signed)
[facility]  Oncology Progress Note  NAME:  [name], Kimberly  SEX:   F  DOB:   1960-12-14  DATE: 06/29/2016  REFERRING PHYSICIAN: JEFFREY Satira Anis  MR#    [id number]  LOCATION: RADIATION ONCOLOGY  BILLING#  [id number]    cc:     The patient was seen in routine clinic on day 26 of planned 28 treatments plus 5 boost treatments to the reconstructed left breast and lymphatics.  She has no complaints at this time.  On examination, skin within the x-ray portal is slightly hyperpigmented, but intact without desquamation or peeling.  Treatment is to continue to the entire chest wall and lymphatic for 2 more treatments and then primary site boost for 5 treatments.  This totals 4680 cGy as of today.  She does report a self-limited headache a few days ago that sounds like sinus pressure and this has not returned.  She is treating it conservatively.  Port films were checked for accuracy and treatment is to continue as planned.      ___________________  Oliver Barre MD  Dictated By: .   SB  D:06/29/2016 [id number]  T: 06/29/2016  [id number]

## 2016-07-07 ENCOUNTER — Inpatient Hospital Stay: Admit: 2016-07-07 | Payer: PRIVATE HEALTH INSURANCE | Primary: Internal Medicine

## 2016-07-07 DIAGNOSIS — Z510 Encounter for antineoplastic radiation therapy: Secondary | ICD-10-CM

## 2016-07-08 NOTE — Discharge Summary (Signed)
[facility]  Oncology Completion Report  NAMEMarland Kitchen Kimberly [name], Kimberly [name]  MR#    [id number]  BILLING#[id number]  SEX F  PT. LOCATION: RADIATION ONCOLOGY  DOB: March 24, 1961  CCLIST: cc: Heath Lark MD, Riki Altes MD, Torrie Mayers MD  TREATMENT INITIATED: 07/07/2016  TREAMENT COMPLETED:     REFERRING PHYSICIANS:  Dr. Heath Lark, Dr. Torrie Mayers, Dr. Riki Altes.    DIAGNOSIS:  Infiltrating ductal carcinoma of the left breast status post neoadjuvant chemotherapy and bilateral mastectomies.    STAGE:   T2  N2.    SOURCE OF RADIATION:  External beam 6 mV photons, 15 mV photons and 6 MeV electrons.    DATE TREATMENT STARTED:   05/24/16      DATE TREATMENT COMPLETED:   07/08/16.    FIELD DESCRIPTION:  Treatment field covered the left chest wall as well as lymphatics in the supraclavicular area and axilla.    TECHNIQUE:  Multifield multibeam 3-dimensional technique employing high and low energy photons, low energy electrons in Paramedic.    DOSE:  5040 cGy in 28 fractions to the entire left chest wall as well as lymphatics in the left axilla and left supraclavicular area with a 1000 cGy boost to the mastectomy scar for a total of 6040 cGy to the previous tumor bed.    REMARKS:  The patient tolerated her consolidative radiation therapy to the left chest wall and lymphatics following surgery and chemotherapy for locally advanced breast cancer exceptionally well.  She developed the expected dry flaking skin irritation, but this is responding well to conservative measures and should fade over the next few weeks.  She will continue to be followed by her referring physicians and with their permission, I would like to see the patient back in followup in 1 month.    I thank the referring physicians for allowing me to participate in the care of this very pleasant woman.      ___________________  Oliver Barre MD  Dictated By: .   mad  D:07/08/2016 07:09:35  T: 07/08/2016 07:33:58  [id number]

## 2016-08-12 ENCOUNTER — Inpatient Hospital Stay: Admit: 2016-08-12 | Payer: PRIVATE HEALTH INSURANCE | Primary: Internal Medicine

## 2016-08-12 NOTE — Progress Notes (Signed)
[facility]  Oncology Follow up  NAME:  Kimberly [name], Kimberly [name]  SEX:   F  DOB:   [birth year]  DATE: 08/12/2016  REFERRING PHYSICIAN: Erven Colla  MR#    [id number]  LOCATION: RADIATION ONCOLOGY  BILLING#  [id number]    cc: Heath Lark MD; Riki Altes MD; Torrie Mayers MD    REFERRING PHYSICIAN:  Dr. Heath Lark, Dr. Torrie Mayers, Dr. Riki Altes.    DIAGNOSIS:  Infiltrating ductal carcinoma of the left breast status post neoadjuvant chemotherapy and bilateral nipple sparing mastectomies, stage T2 N2.      TREATMENT:  From 05/24/16 to 07/08/16 the patient received a total of 5040 cGy in 28 fractions to the entire left chest wall and reconstructed breast as well as lymphatics in the left axilla and left supraclavicular region.  1000 cGy boost to the mastectomy scar was delivered for a total of 6040 cGy to the previous tumor bed.    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  Although her tumor was somewhat aggressive with heavy lymphatic burden she is clinically without evidence of disease at this time.  She continues on oral medication as per Dr. Ples Specter.  She has seen Dr. Stevie Kern with plans for permanent reconstruction later this summer.  She will also continue to be followed by Dr. Owens Shark.  As such, I have not given her a set followup appointment in this department.  If, however, she should require radiotherapeutic reevaluation, I would be more than happy to see her at any time.    I thank the referring physicians for allowing me to continue to participate in the care of this very pleasant woman.      ___________________  Oliver Barre MD  Dictated By: .   mad  D:08/12/2016 54:00:86  T: 08/12/2016  [id number]

## 2016-11-10 IMAGING — CT CT HEAD WO/W CM
1 of 4 series · 11 of 30 positions shown, 14 images · IV contrast (iopamidol)
Comparison: None.

CLINICAL DATA: Initial evaluation for acute altered mental status.

EXAM:
CT HEAD WITHOUT AND WITH CONTRAST
TECHNIQUE: Contiguous axial images were obtained from the base of the skull
through the vertex without and with intravenous contrast
CONTRAST:  75mL I2KODK-IKK IOPAMIDOL (I2KODK-IKK) INJECTION 61%

[Series 2: head w/o · axial · non-contrast · 0.43mm/px · z∈[+1461,+1576]mm · 11 of 29 slices shown, 14 images]
[im 3/29  brain]
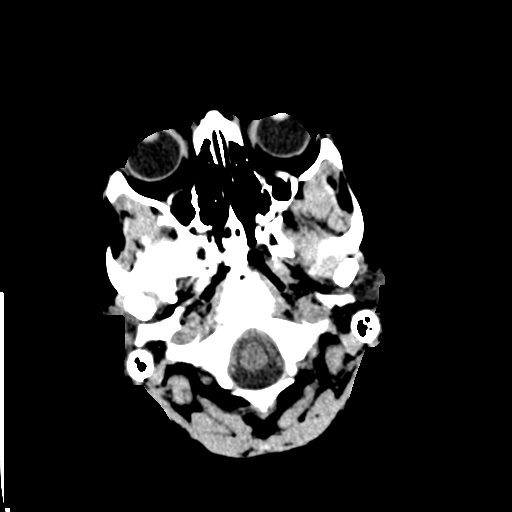
[im 3/29  bone]
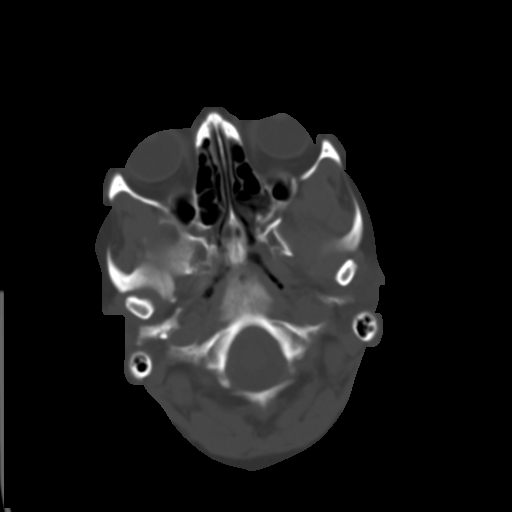
[im 5/29  brain]
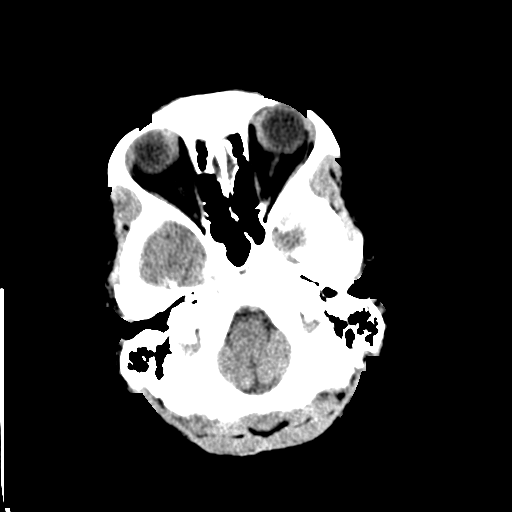
[im 8/29  brain]
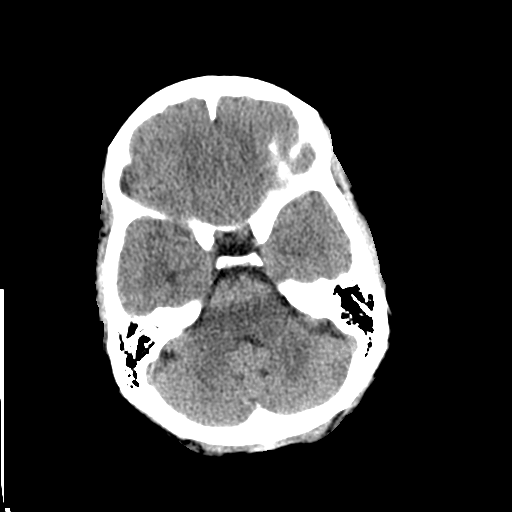
[im 10/29  brain]
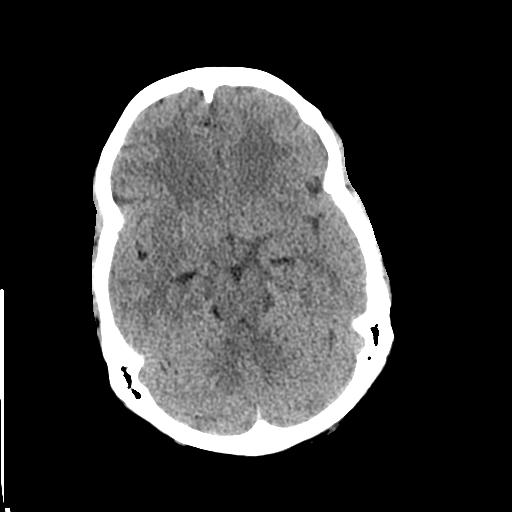
[im 12/29  brain]
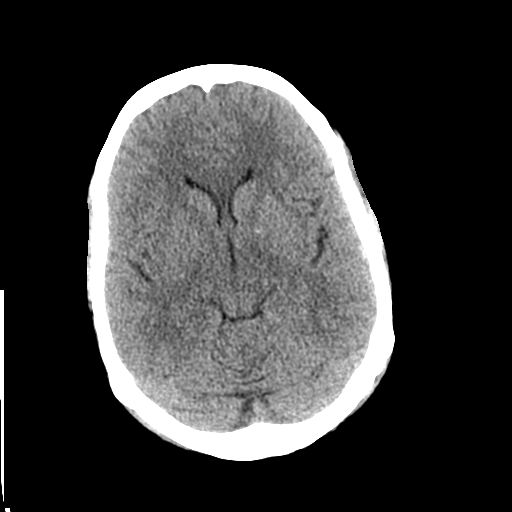
[im 12/29  bone]
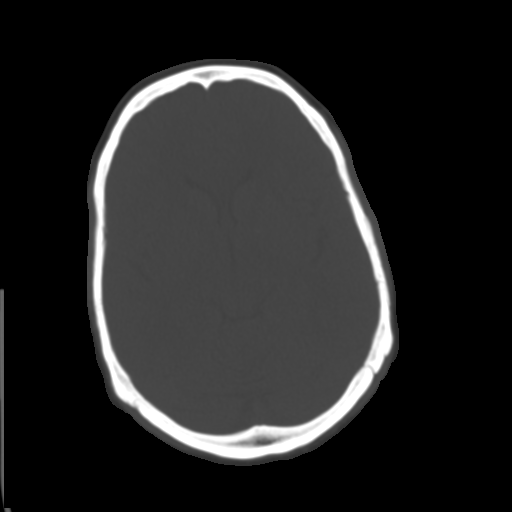
[im 15/29  brain]
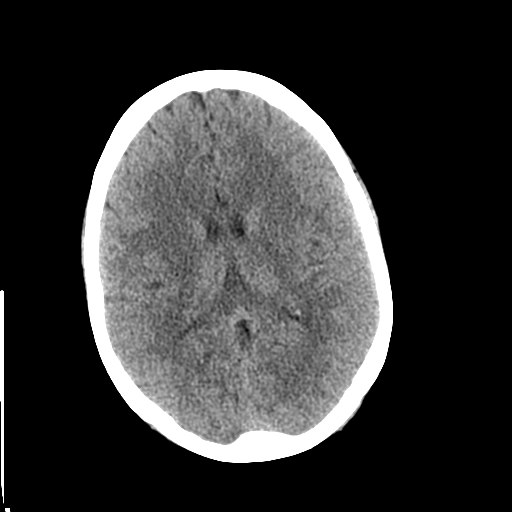
[im 17/29  brain]
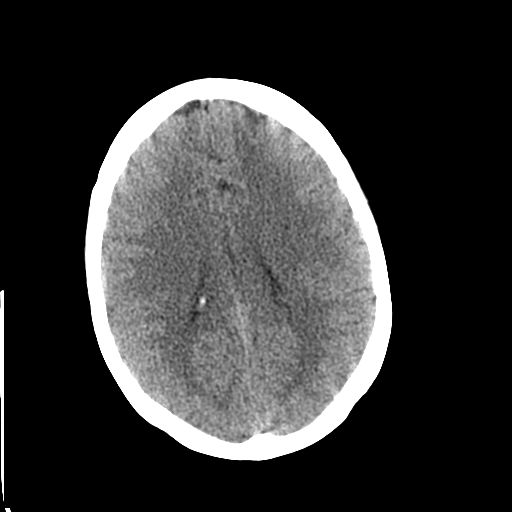
[im 19/29  brain]
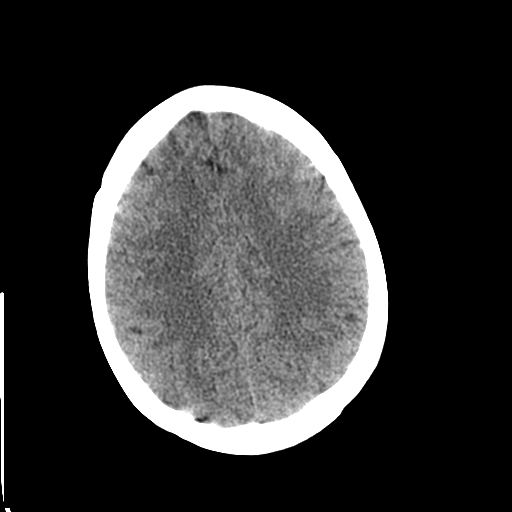
[im 22/29  brain]
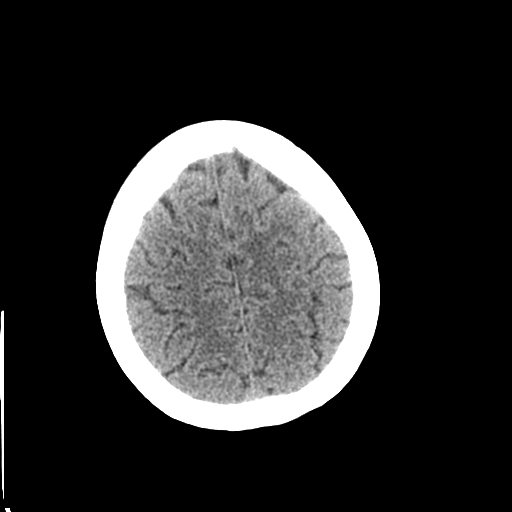
[im 22/29  bone]
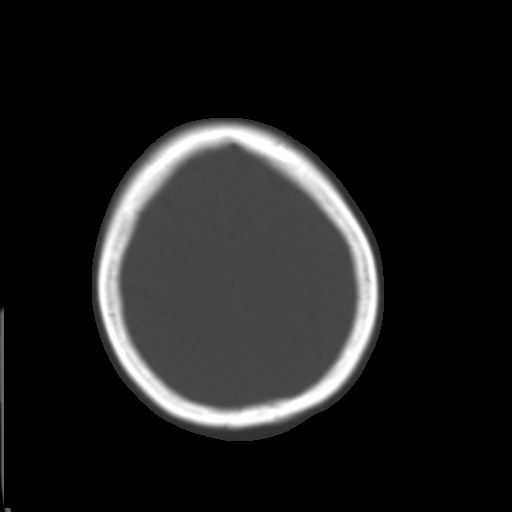
[im 24/29  brain]
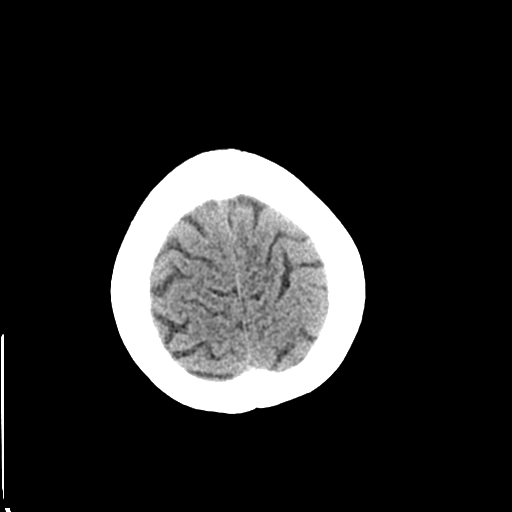
[im 26/29  brain]
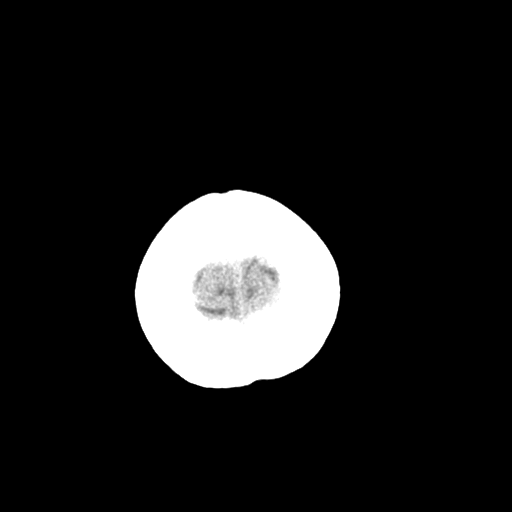

[11 of 30 positions shown; findings below may reference images not displayed]

FINDINGS: There is no acute intracranial hemorrhage or infarct. No mass lesion
or midline shift. Gray-white matter differentiation is well
maintained. Ventricles are normal in size without evidence of
hydrocephalus. CSF containing spaces are within normal limits. No
extra-axial fluid collection. No abnormal enhancement.

The calvarium is intact.

Orbital soft tissues are within normal limits.

The paranasal sinuses and mastoid air cells are well pneumatized and
free of fluid.

Scalp soft tissues are unremarkable.
IMPRESSION: Normal contrast enhanced CT of the head. No acute intracranial
process identified.

## 2017-01-11 ENCOUNTER — Emergency Department: Admit: 2017-01-12 | Payer: PRIVATE HEALTH INSURANCE | Primary: Internal Medicine

## 2017-01-11 DIAGNOSIS — R109 Unspecified abdominal pain: Secondary | ICD-10-CM
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
## 2017-01-12 ENCOUNTER — Inpatient Hospital Stay
Admit: 2017-01-12 | Discharge: 2017-01-12 | Disposition: A | Discharge status: Home / Self Care | Payer: PRIVATE HEALTH INSURANCE | Attending: Emergency Medicine

## 2017-01-12 LAB — CBC WITH AUTOMATED DIFF
ABS. BASOPHILS: 0 10*3/uL (ref 0.0–0.1)
ABS. EOSINOPHILS: 0.3 10*3/uL (ref 0.0–0.4)
ABS. LYMPHOCYTES: 0.7 10*3/uL — ABNORMAL LOW (ref 0.9–3.6)
ABS. MONOCYTES: 0.7 10*3/uL (ref 0.05–1.2)
ABS. NEUTROPHILS: 4.4 10*3/uL (ref 1.8–8.0)
BASOPHILS: 0 % (ref 0–2)
EOSINOPHILS: 4 % (ref 0–5)
HCT: 31.4 % — ABNORMAL LOW (ref 35.0–45.0)
HGB: 10.2 g/dL — ABNORMAL LOW (ref 12.0–16.0)
LYMPHOCYTES: 12 % — ABNORMAL LOW (ref 21–52)
MCH: 34.5 PG — ABNORMAL HIGH (ref 24.0–34.0)
MCHC: 32.5 g/dL (ref 31.0–37.0)
MCV: 106.1 FL — ABNORMAL HIGH (ref 74.0–97.0)
MONOCYTES: 12 % — ABNORMAL HIGH (ref 3–10)
MPV: 8.9 FL — ABNORMAL LOW (ref 9.2–11.8)
NEUTROPHILS: 72 % (ref 40–73)
PLATELET: 358 10*3/uL (ref 135–420)
RBC: 2.96 M/uL — ABNORMAL LOW (ref 4.20–5.30)
RDW: 14.9 % — ABNORMAL HIGH (ref 11.6–14.5)
WBC: 6.2 10*3/uL (ref 4.6–13.2)

## 2017-01-12 LAB — HEPATIC FUNCTION PANEL
A-G Ratio: 1.1 (ref 0.8–1.7)
ALT (SGPT): 121 U/L — ABNORMAL HIGH (ref 13–56)
AST (SGOT): 81 U/L — ABNORMAL HIGH (ref 15–37)
Albumin: 3.5 g/dL (ref 3.4–5.0)
Alk. phosphatase: 56 U/L (ref 45–117)
Bilirubin, direct: 0.1 MG/DL (ref 0.0–0.2)
Bilirubin, total: 0.3 MG/DL (ref 0.2–1.0)
Globulin: 3.1 g/dL (ref 2.0–4.0)
Protein, total: 6.6 g/dL (ref 6.4–8.2)

## 2017-01-12 LAB — URINALYSIS W/ RFLX MICROSCOPIC
Bilirubin: NEGATIVE
Blood: NEGATIVE
Glucose: NEGATIVE mg/dL
Ketone: NEGATIVE mg/dL
Nitrites: NEGATIVE
Protein: NEGATIVE mg/dL
Specific gravity: 1.01 (ref 1.005–1.030)
Urobilinogen: 0.2 EU/dL (ref 0.2–1.0)
pH (UA): 6 (ref 5.0–8.0)

## 2017-01-12 LAB — METABOLIC PANEL, BASIC
Anion gap: 4 mmol/L (ref 3.0–18)
BUN/Creatinine ratio: 18 (ref 12–20)
BUN: 13 MG/DL (ref 7.0–18)
CO2: 32 mmol/L (ref 21–32)
Calcium: 8.7 MG/DL (ref 8.5–10.1)
Chloride: 104 mmol/L (ref 100–108)
Creatinine: 0.72 MG/DL (ref 0.6–1.3)
GFR est AA: >60 mL/min/{1.73_m2} (ref >60)
GFR est non-AA: >60 mL/min/{1.73_m2} (ref >60)
Glucose: 123 mg/dL — ABNORMAL HIGH (ref 74–99)
Potassium: 4 mmol/L (ref 3.5–5.5)
Sodium: 140 mmol/L (ref 136–145)

## 2017-01-12 LAB — URINE MICROSCOPIC ONLY
RBC: 0 /hpf (ref 0–5)
WBC: 0 /hpf (ref 0–4)

## 2017-01-12 LAB — LIPASE: Lipase: 87 U/L (ref 73–393)

## 2017-01-12 MED ORDER — IOPAMIDOL 61 % IV SOLN
300 mg iodine /mL (61 %) | Freq: Once | INTRAVENOUS | Status: AC
Start: 2017-01-12 — End: 2017-01-11
  Administered 2017-01-12: 04:00:00 via INTRAVENOUS

## 2017-01-12 MED FILL — ISOVUE-300  61 % INTRAVENOUS SOLUTION: 300 mg iodine /mL (61 %) | INTRAVENOUS | Qty: 100

## 2017-01-12 NOTE — ED Notes (Signed)
Patient updated regarding current disposition. Condition unchanged at this time. Body indicators negative for pain or discomfort. All questions answered. Will continue to monitor patient's condition, including vital signs.

## 2017-01-12 NOTE — ED Notes (Signed)
I have reviewed discharge instructions with the patient.  The patient verbalized understanding.Patient armband removed and given to patient to take home.  Patient was informed of the privacy risks if armband lost or stolen  Current Discharge Medication List

## 2017-04-04 ENCOUNTER — Encounter

## 2017-04-07 ENCOUNTER — Inpatient Hospital Stay: Admit: 2017-04-07 | Payer: PRIVATE HEALTH INSURANCE | Attending: Hematology & Oncology | Primary: Internal Medicine

## 2017-04-07 DIAGNOSIS — C50412 Malignant neoplasm of upper-outer quadrant of left female breast: Secondary | ICD-10-CM

## 2017-04-07 MED ORDER — TECHNETIUM TC 99M MEDRONATE IV KIT
Freq: Once | Status: AC
Start: 2017-04-07 — End: 2017-04-07
  Administered 2017-04-07: 14:00:00 via INTRAVENOUS

## 2017-04-26 ENCOUNTER — Encounter

## 2017-05-03 ENCOUNTER — Encounter

## 2017-05-08 ENCOUNTER — Inpatient Hospital Stay: Admit: 2017-05-08 | Payer: PRIVATE HEALTH INSURANCE | Attending: Specialist | Primary: Internal Medicine

## 2017-05-08 ENCOUNTER — Encounter

## 2017-05-08 DIAGNOSIS — N6489 Other specified disorders of breast: Secondary | ICD-10-CM

## 2017-05-19 ENCOUNTER — Inpatient Hospital Stay: Admit: 2017-05-19 | Payer: PRIVATE HEALTH INSURANCE | Attending: Specialist | Primary: Internal Medicine

## 2017-05-19 DIAGNOSIS — R51 Headache: Secondary | ICD-10-CM

## 2017-05-19 MED ORDER — IOPAMIDOL 76 % IV SOLN
370 mg iodine /mL (76 %) | Freq: Once | INTRAVENOUS | Status: AC
Start: 2017-05-19 — End: 2017-05-19
  Administered 2017-05-19: 13:00:00 via INTRAVENOUS

## 2017-05-19 MED FILL — ISOVUE-370  76 % INTRAVENOUS SOLUTION: 370 mg iodine /mL (76 %) | INTRAVENOUS | Qty: 80

## 2017-08-01 ENCOUNTER — Encounter

## 2017-08-09 ENCOUNTER — Inpatient Hospital Stay: Admit: 2017-08-09 | Payer: PRIVATE HEALTH INSURANCE | Attending: Hematology & Oncology | Primary: Internal Medicine

## 2017-08-09 DIAGNOSIS — R0609 Other forms of dyspnea: Secondary | ICD-10-CM

## 2017-09-05 ENCOUNTER — Inpatient Hospital Stay: Admit: 2017-09-05 | Primary: Internal Medicine

## 2017-09-05 LAB — SENTARA SPECIMEN COLLN.

## 2017-10-22 ENCOUNTER — Inpatient Hospital Stay
Admit: 2017-10-22 | Discharge: 2017-10-27 | Disposition: A | Discharge status: Home / Self Care | Payer: PRIVATE HEALTH INSURANCE | Attending: Psychiatry | Admitting: Psychiatry

## 2017-10-22 DIAGNOSIS — F250 Schizoaffective disorder, bipolar type: Principal | ICD-10-CM

## 2017-10-22 LAB — CBC WITH AUTOMATED DIFF
ABS. BASOPHILS: 0 10*3/uL (ref 0.0–0.1)
ABS. EOSINOPHILS: 0 10*3/uL (ref 0.0–0.4)
ABS. LYMPHOCYTES: 1.2 10*3/uL (ref 0.9–3.6)
ABS. MONOCYTES: 0.8 10*3/uL (ref 0.05–1.2)
ABS. NEUTROPHILS: 6.9 10*3/uL (ref 1.8–8.0)
BASOPHILS: 0 % (ref 0–2)
EOSINOPHILS: 0 % (ref 0–5)
HCT: 33.8 % — ABNORMAL LOW (ref 35.0–45.0)
HGB: 10.8 g/dL — ABNORMAL LOW (ref 12.0–16.0)
LYMPHOCYTES: 14 % — ABNORMAL LOW (ref 21–52)
MCH: 29.8 PG (ref 24.0–34.0)
MCHC: 32 g/dL (ref 31.0–37.0)
MCV: 93.4 FL (ref 74.0–97.0)
MONOCYTES: 9 % (ref 3–10)
MPV: 9.1 FL — ABNORMAL LOW (ref 9.2–11.8)
NEUTROPHILS: 77 % — ABNORMAL HIGH (ref 40–73)
PLATELET: 417 10*3/uL (ref 135–420)
RBC: 3.62 M/uL — ABNORMAL LOW (ref 4.20–5.30)
RDW: 15.5 % — ABNORMAL HIGH (ref 11.6–14.5)
WBC: 9 10*3/uL (ref 4.6–13.2)

## 2017-10-22 LAB — SALICYLATE: Salicylate level: <1.7 MG/DL — ABNORMAL LOW (ref 2.8–20.0)

## 2017-10-22 LAB — URINALYSIS W/ RFLX MICROSCOPIC
Bilirubin: NEGATIVE
Blood: NEGATIVE
Glucose: NEGATIVE mg/dL
Ketone: 80 mg/dL — AB
Nitrites: NEGATIVE
Specific gravity: >1.03 — ABNORMAL HIGH (ref 1.005–1.030)
Urobilinogen: 1 EU/dL (ref 0.2–1.0)
pH (UA): 5 (ref 5.0–8.0)

## 2017-10-22 LAB — ACETAMINOPHEN: Acetaminophen level: <2 ug/mL — ABNORMAL LOW (ref 10.0–30.0)

## 2017-10-22 LAB — METABOLIC PANEL, COMPREHENSIVE
A-G Ratio: 1.3 (ref 0.8–1.7)
ALT (SGPT): 34 U/L (ref 13–56)
AST (SGOT): 61 U/L — ABNORMAL HIGH (ref 15–37)
Albumin: 4.2 g/dL (ref 3.4–5.0)
Alk. phosphatase: 57 U/L (ref 45–117)
Anion gap: 10 mmol/L (ref 3.0–18)
BUN/Creatinine ratio: 23 — ABNORMAL HIGH (ref 12–20)
BUN: 23 MG/DL — ABNORMAL HIGH (ref 7.0–18)
Bilirubin, total: 0.6 MG/DL (ref 0.2–1.0)
CO2: 23 mmol/L (ref 21–32)
Calcium: 9.4 MG/DL (ref 8.5–10.1)
Chloride: 107 mmol/L (ref 100–108)
Creatinine: 0.98 MG/DL (ref 0.6–1.3)
GFR est AA: >60 mL/min/{1.73_m2} (ref >60)
GFR est non-AA: 59 mL/min/{1.73_m2} — ABNORMAL LOW (ref >60)
Globulin: 3.2 g/dL (ref 2.0–4.0)
Glucose: 65 mg/dL — ABNORMAL LOW (ref 74–99)
Potassium: 4.1 mmol/L (ref 3.5–5.5)
Protein, total: 7.4 g/dL (ref 6.4–8.2)
Sodium: 140 mmol/L (ref 136–145)

## 2017-10-22 LAB — URINE MICROSCOPIC ONLY
Hyaline cast: 36 /lpf (ref 0–2)
RBC: 0 /hpf (ref 0–5)
WBC: 11 /hpf (ref 0–4)

## 2017-10-22 LAB — DRUG SCREEN, URINE
AMPHETAMINES: NEGATIVE
BARBITURATES: NEGATIVE
BENZODIAZEPINES: NEGATIVE
COCAINE: NEGATIVE
METHADONE: NEGATIVE
OPIATES: NEGATIVE
PCP(PHENCYCLIDINE): NEGATIVE
THC (TH-CANNABINOL): NEGATIVE

## 2017-10-22 LAB — ETHYL ALCOHOL: ALCOHOL(ETHYL),SERUM: <3 MG/DL (ref 0–3)

## 2017-10-22 LAB — HCG URINE, QL: HCG urine, QL: NEGATIVE

## 2017-10-22 NOTE — ED Notes (Signed)
S[facility] #    [facility] [phone] [facility] [place] [facility] [phone] J[place]

## 2017-10-22 NOTE — Progress Notes (Signed)
Chaplain conducted an initial consultation and Spiritual Assessment for Kimberly [name], who is a 57 y.o.,female. Patient???s Primary Language is: [place].   According to the patient???s EMR Religious Affiliation is: Pentecostal.     The reason the Patient came to the hospital is: There are no active problems to display for this patient.       The Chaplain provided the following Interventions:  Initiated a relationship of care and support.   Explored issues of faith, belief, spirituality and religious/ritual needs while hospitalized.  Listened empathically.  Provided chaplaincy education.  Provided information about Spiritual Care Services.  Offered prayer and assurance of continued prayers on patient's behalf.   Chart reviewed.    The following outcomes where achieved:  Patient shared limited information about both their medical narrative and spiritual journey/beliefs.  Patient expressed gratitude for chaplain's visit.    Assessment:  Patient does not have any religious/cultural needs that will affect patient???s preferences in health care.  There are no spiritual or religious issues which require intervention at this time.     Plan:  Chaplains will continue to follow and will provide pastoral care on an as needed/requested basis.  Chaplain recommends bedside caregivers page chaplain on duty if patient shows signs of acute spiritual or emotional distress.        [place]   [phone]

## 2017-10-22 NOTE — ED Provider Notes (Signed)
[facility]  Emergency Department Treatment Report    Patient: Kimberly [name] Age: 57 y.o. Sex: female    Date of Birth: 11-11-1960 Admit Date: 10/22/2017 PCP: Anne Shutter, MD   MRN: [id number]  CSN: [id number]     Room: ER20/20 Time Dictated: 7:57 PM      I hereby certify this patient for admission based upon medical necessity as ??  noted below:    Chief Complaint   Chief Complaint   Patient presents with   ??? Mental Health Problem       History of Present Illness   57 y.o. female with past medical history as below presents with altered mental status.  Family states the patient has been acting erratically today.  States that she was found wandering.  Patient states that she was recently admitted an outside hospital for wandering.  However, she states that she is currently on no medications.  During the interview, patient began talking to her auditory hallucinations.  Patient denies any relieving just pending factors.  Family describes the symptoms as moderate to severe.  Patient denies fever, chills, chest pain, shortness of breath, abdominal pain, nausea/vomiting/diarrhea, or other associated symptoms.    Review of Systems   Constitutional:  No fever, chills  Eyes: No visual complaints.  ENT: No sore throat, runny nose  Heme/Lymph: No easy bruising or lymph node swelling  Respiratory: No cough, dyspnea  Cardiovascular: No chest pain, palpitations  Gastrointestinal: No vomiting, diarrhea  Genitourinary: No dysuria, frequency  Musculoskeletal: No joint pain, swelling  Integumentary: No rashes or other skin lesions  Neurological: No headaches, sensory or motor symptoms.  Denies complaints in all other systems    Past Medical/Surgical History     Past Medical History:   Diagnosis Date   ??? Cancer ([place])    ??? Psychiatric disorder     schzophrenia     Past Surgical History:   Procedure Laterality Date   ??? BREAST SURGERY PROCEDURE UNLISTED      bilateral   ??? HX GYN      tubal ligation    ??? HX HERNIA REPAIR N/A     umbilical   ??? IR OCCL TXCATH ORGAN W SI N/A        Social History     Social History     Socioeconomic History   ??? Marital status: SINGLE     Spouse name: Not on file   ??? Number of children: Not on file   ??? Years of education: Not on file   ??? Highest education level: Not on file   Tobacco Use   ??? Smoking status: Never Smoker   ??? Smokeless tobacco: Never Used   Substance and Sexual Activity   ??? Alcohol use: No   ??? Drug use: No   ??? Sexual activity: Not Currently       Family History   History reviewed. No pertinent family history.    Current Medications     Current Facility-Administered Medications   Medication Dose Route Frequency Provider Last Rate Last Dose   ??? cephALEXin (KEFLEX) capsule 500 mg  500 mg Oral QID Lenon Curt, MD         Current Outpatient Medications   Medication Sig Dispense Refill   ??? diphenhydrAMINE (BENADRYL) 25 mg tablet Take 25 mg by mouth every six (6) hours as needed for Sleep.     ??? therapeutic multivitamin-minerals (THERAGRAN-M) tablet Take 1 Tab by mouth daily.     ???  ondansetron hcl (ZOFRAN) 8 mg tablet Take 8 mg by mouth every eight (8) hours as needed for Nausea.       Allergies     Allergies   Allergen Reactions   ??? Ativan [Lorazepam] Unknown (comments)     Pt unclear.     Physical Exam     ED Triage Vitals [10/22/17 1135]   Enc Vitals Group      BP 138/88      Pulse (Heart Rate) (!) 104      Resp Rate 16      Temp 98.3 ??F (36.8 ??C)      Temp src       O2 Sat (%) 100 %      Weight       Height '5\' 11"'       Head Circumference       Peak Flow       Pain Score       Pain Loc       Pain Edu?       Excl. in [place]?      Constituational: Patient is afebrile, vital signs reviewed, patient is uncomfortable in no acute distress.    Head: Atraumatic, normocephalic  Eyes: Normal inspection. No conjunctival injection or discharge.  ENT:  No facial bruises, normal external ear and nose inspection.   Neck:  Supple, non tender. normal inspection.     Cardiovascular:  regular rate and rhythm, heart sounds normal without murmurs.  Radial pulses 2+ and equal bilaterally  Respiratory:  No respiratory distress, breath sounds normal.  No rales or wheezing  Abdomen: soft, nontender, nondistended, no rebound or guarding, normoactive bowel sounds  Back: non-tender. No CVA tenderness.  Musculoskeletal:  normal ROM, non-tender, no pedal edema.  Calves soft, symmetric, and non-tender with no palpable cords.   Skin: color normal, no rash, warm, dry .  Neuro: awake & alert oriented ??3, no motor/sensory deficit. Upper extremity and lower extremity strength and sensation are grossly normal, intact, and symmetric.  CN 3-12 normal, intact, and symmetric.  Psych: Speaking with auditory hallucinations    Impression and Management Plan    Labs and imaging studies will be utilized to narrow the differential diagnosis and evaluate the potential causes of the patients chief complaint, and final disposition will be based on the results of their workup as well as their response to symptomatic treatment.    Diagnostic Studies   Lab:   Recent Results (from the past 24 hour(s))   CBC WITH AUTOMATED DIFF    Collection Time: 10/22/17  2:47 PM   Result Value Ref Range    WBC 9.0 4.6 - 13.2 K/uL    RBC 3.62 (L) 4.20 - 5.30 M/uL    HGB 10.8 (L) 12.0 - 16.0 g/dL    HCT 33.8 (L) 35.0 - 45.0 %    MCV 93.4 74.0 - 97.0 FL    MCH 29.8 24.0 - 34.0 PG    MCHC 32.0 31.0 - 37.0 g/dL    RDW 15.5 (H) 11.6 - 14.5 %    PLATELET 417 135 - 420 K/uL    MPV 9.1 (L) 9.2 - 11.8 FL    NEUTROPHILS 77 (H) 40 - 73 %    LYMPHOCYTES 14 (L) 21 - 52 %    MONOCYTES 9 3 - 10 %    EOSINOPHILS 0 0 - 5 %    BASOPHILS 0 0 - 2 %    ABS. NEUTROPHILS 6.9 1.8 - 8.0  K/UL    ABS. LYMPHOCYTES 1.2 0.9 - 3.6 K/UL    ABS. MONOCYTES 0.8 0.05 - 1.2 K/UL    ABS. EOSINOPHILS 0.0 0.0 - 0.4 K/UL    ABS. BASOPHILS 0.0 0.0 - 0.1 K/UL    DF AUTOMATED     METABOLIC PANEL, COMPREHENSIVE    Collection Time: 10/22/17  2:47 PM    Result Value Ref Range    Sodium 140 136 - 145 mmol/L    Potassium 4.1 3.5 - 5.5 mmol/L    Chloride 107 100 - 108 mmol/L    CO2 23 21 - 32 mmol/L    Anion gap 10 3.0 - 18 mmol/L    Glucose 65 (L) 74 - 99 mg/dL    BUN 23 (H) 7.0 - 18 MG/DL    Creatinine 0.98 0.6 - 1.3 MG/DL    BUN/Creatinine ratio 23 (H) 12 - 20      GFR est AA >60 >60 ml/min/1.78m    GFR est non-AA 59 (L) >60 ml/min/1.74m   Calcium 9.4 8.5 - 10.1 MG/DL    Bilirubin, total 0.6 0.2 - 1.0 MG/DL    ALT (SGPT) 34 13 - 56 U/L    AST (SGOT) 61 (H) 15 - 37 U/L    Alk. phosphatase 57 45 - 117 U/L    Protein, total 7.4 6.4 - 8.2 g/dL    Albumin 4.2 3.4 - 5.0 g/dL    Globulin 3.2 2.0 - 4.0 g/dL    A-G Ratio 1.3 0.8 - 1.7     SALICYLATE    Collection Time: 10/22/17  2:47 PM   Result Value Ref Range    Salicylate level <1<3.2L) 2.8 - 20.0 MG/DL   ETHYL ALCOHOL    Collection Time: 10/22/17  2:47 PM   Result Value Ref Range    ALCOHOL(ETHYL),SERUM <3 0 - 3 MG/DL   ACETAMINOPHEN    Collection Time: 10/22/17  2:47 PM   Result Value Ref Range    Acetaminophen level <2 (L) 10.0 - 30.0 ug/mL   URINALYSIS W/ RFLX MICROSCOPIC    Collection Time: 10/22/17  4:00 PM   Result Value Ref Range    Color YELLOW      Appearance CLEAR      Specific gravity >1.030 (H) 1.005 - 1.030    pH (UA) 5.0 5.0 - 8.0      Protein TRACE (A) NEG mg/dL    Glucose NEGATIVE  NEG mg/dL    Ketone 80 (A) NEG mg/dL    Bilirubin NEGATIVE  NEG      Blood NEGATIVE  NEG      Urobilinogen 1.0 0.2 - 1.0 EU/dL    Nitrites NEGATIVE  NEG      Leukocyte Esterase SMALL (A) NEG     HCG URINE, QL    Collection Time: 10/22/17  4:00 PM   Result Value Ref Range    HCG urine, QL NEGATIVE  NEG     DRUG SCREEN, URINE    Collection Time: 10/22/17  4:00 PM   Result Value Ref Range    BENZODIAZEPINES NEGATIVE  NEG      BARBITURATES NEGATIVE  NEG      THC (TH-CANNABINOL) NEGATIVE  NEG      OPIATES NEGATIVE  NEG      PCP(PHENCYCLIDINE) NEGATIVE  NEG      COCAINE NEGATIVE  NEG      AMPHETAMINES NEGATIVE  NEG       METHADONE NEGATIVE  NEG  HDSCOM (NOTE)    URINE MICROSCOPIC ONLY    Collection Time: 10/22/17  4:00 PM   Result Value Ref Range    WBC 11 to 20 0 - 4 /hpf    RBC 0 to 3 0 - 5 /hpf    Epithelial cells 2+ 0 - 5 /lpf    Bacteria FEW (A) NEG /hpf    Mucus 3+ (A) NEG /lpf    Hyaline cast 36 to 50 0 - 2 /lpf       Imaging:    No orders to display     No results found.      MOST RECENT VITALS   Visit Vitals  BP 138/88 (BP 1 Location: Right arm, BP Patient Position: At rest)   Pulse (!) 104   Temp 98.3 ??F (36.8 ??C)   Resp 16   Ht '5\' 11"'  (1.803 m)   SpO2 100%   BMI 20.78 kg/m??       Medical Decision Making   Patient seen and examined.  Well-appearing, nontoxic, afebrile patient presents with auditory hallucinations found wandering.  Patient with history of schizophrenia.  Currently not on any medications.  Patient with tangential thoughts and speaking with the voices in her head during my exam.  Patient denies suicidal ideation or homicidal ideation.  Physical exam reassuring.  Labs reassuring.  Anemia is at patient's baseline.  Urine is concerning for potential urinary tract infection.  Will give p.o. Keflex here.  I do not suspect this urinary tract infection to be the etiology of patient's symptoms.  Do not suspect organic etiology, intoxication, toxidrome, withdrawal as etiology of this patient's symptoms.  Suspect poorly controlled schizophrenia.  Discussed case with crisis who will have [facility] evaluate the patient for TDO order.  Patient turned over to Dr. Loman Brooklyn pending [facility] evaluation.      Final Diagnosis       ICD-10-CM ICD-9-CM   1. Schizophrenia, unspecified type ([place]) F20.9 295.90   2. Anemia, unspecified type D64.9 285.9   3. Urinary tract infection without hematuria, site unspecified N39.0 599.0     Disposition   Turned over pending [facility] eval    Lenon Curt, MD  October 22, 2017      My signature above authenticates this document and my orders, the final ??   diagnosis (es), discharge prescription (s), and instructions in the Epic ??  record.  ??  Nursing notes have been reviewed by the physician/ advanced practice ??  Clinician.  Dragon medical dictation software was used for portions of this report. Unintended voice recognition errors may occur.

## 2017-10-22 NOTE — ED Notes (Signed)
Bedside shift report given to heather rn    Reviewed:  ?? Kardex  ?? SBAR  ?? MAR  ?? I/Os

## 2017-10-22 NOTE — ED Triage Notes (Signed)
Per pt's son: pt is not acting right with rolling around on the ground, persistent laughing.. Pt says she's having a "party with God".Marland Kitchen

## 2017-10-22 NOTE — Other (Signed)
57 yo F seen in ED room 20 at the request of Dr.Speicher. Per Dr.Speicher, client is medically cleared for psychiatric disposition to be determined. History obtained from client alone and from her sons and EMR. Smiles when approached, immediately began giggling and talking to others with no one else present. Did this often throughout interview, explaining she "has a gift." Alert & oriented to person & place. Circumstance & timeframe of events less precise. Memory basically intact, as confirmed later by checking data with her son, however, gives several inconsistencies in responses. Judgement impaired, insight poor. Lives with her mother X 6 weeks. 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 Mother of 2 adult sons, both present and very concerned about their mother.    CC: bizarre behavior, auditory hallucinations    Son reports mother is talking to herself, wandering away. Found today in the woods rolling on the ground laughing. Client assured this Probation officer that she is visiting the [place] to save [name] all. Spitting into a bag periodically "to purge myself, I love living." Client wearing found flower & botanicals in her hair. At times tangential and not answering appropriately. Responding to internal stimuli throughout conversation with much inappropriate laughter. Reports poor sleep.  Denies suicidal or homicidal ideation.  Laughs and states, "I can't tell you, I'm not allowed to tell you what the others say." Conferred and responded to apparent auditory hallucinations.  Denies alcohol or drug use. BAL & UDSC both negative.    Denies any psychotropic medications. Admits she has been on them in the past but "clearly does not need them."   No current outpatient therapist or psychiatrist.  Admits to prior inpatient psychiatric treatment. Per patient: most  recently in [place] in May 2016. Per son: he picked her up at a hospital in [place] 6 weeks ago.    Client declines offer of voluntary inpatient psychiatric treatment. Sons are willing and support petitioning the court for a TDO. Son states his mother is not safe alone and is very sick and not close to her baseline functioning. He spoke to his aunt who confirms that client has had mental health problems all of her adult life. Son believes client needs imminent intervention for her out of control bizarre behavior.    DISPOSITION: Discussed with Dr.Speicher. Client is psychotic, delusional and responding to internal stimuli, with poor reality testing at this time. [facility] clinician contacted to prescreen for TDO.

## 2017-10-23 MED ORDER — OLANZAPINE 5 MG TAB, RAPID DISSOLVE
5 mg | ORAL | Status: DC
Start: 2017-10-23 — End: 2017-10-23

## 2017-10-23 MED ORDER — ACETAMINOPHEN 325 MG TABLET
325 mg | Freq: Four times a day (QID) | ORAL | Status: DC | PRN
Start: 2017-10-23 — End: 2017-10-27
  Administered 2017-10-24: 22:00:00 via ORAL

## 2017-10-23 MED ORDER — ZIPRASIDONE MESYLATE 20 MG IM SOLR
20 mg/mL (final conc.) | Freq: Once | INTRAMUSCULAR | Status: AC
Start: 2017-10-23 — End: 2017-10-23
  Administered 2017-10-23: 10:00:00 via INTRAMUSCULAR

## 2017-10-23 MED ORDER — HALOPERIDOL LACTATE 5 MG/ML IJ SOLN
5 mg/mL | Freq: Four times a day (QID) | INTRAMUSCULAR | Status: DC | PRN
Start: 2017-10-23 — End: 2017-10-27
  Administered 2017-10-24 – 2017-10-25 (×2): via INTRAMUSCULAR

## 2017-10-23 MED ORDER — CEPHALEXIN 250 MG CAP
250 mg | Freq: Four times a day (QID) | ORAL | Status: DC
Start: 2017-10-23 — End: 2017-10-23
  Administered 2017-10-23: 02:00:00 via ORAL

## 2017-10-23 MED ORDER — BENZTROPINE 1 MG TAB
1 mg | Freq: Four times a day (QID) | ORAL | Status: DC | PRN
Start: 2017-10-23 — End: 2017-10-27
  Administered 2017-10-25: via ORAL

## 2017-10-23 MED ORDER — CEPHALEXIN 250 MG CAP
250 mg | Freq: Four times a day (QID) | ORAL | Status: DC
Start: 2017-10-23 — End: 2017-10-27
  Administered 2017-10-23 – 2017-10-27 (×16): via ORAL

## 2017-10-23 MED ORDER — HALOPERIDOL 5 MG TAB
5 mg | Freq: Four times a day (QID) | ORAL | Status: DC | PRN
Start: 2017-10-23 — End: 2017-10-27

## 2017-10-23 MED ORDER — HYDROXYZINE PAMOATE 50 MG CAP
50 mg | ORAL | Status: DC | PRN
Start: 2017-10-23 — End: 2017-10-27

## 2017-10-23 MED FILL — CEPHALEXIN 250 MG CAP: 250 mg | ORAL | Qty: 2

## 2017-10-23 MED FILL — GEODON 20 MG/ML (FINAL CONCENTRATION) INTRAMUSCULAR SOLUTION: 20 mg/mL (final conc.) | INTRAMUSCULAR | Qty: 20

## 2017-10-23 NOTE — Other (Signed)
ART THERAPY GROUP PROGRESS NOTE    Group time:1230    The patient did not awaken/get up when called for group.

## 2017-10-23 NOTE — Behavioral Health Treatment Team (Cosign Needed Addendum)
Patient participated in recreational group today, while being out doors she was crawling in the bushes on her knees and picking up sticks , she was redirected staff to go indoor, she complied , as she sat In the day room, she was speaking jumbled words loudly disturbing the other patients she was asked by staff to take it to her room, she complied.,Staff will continue to monitor patient for safety.

## 2017-10-23 NOTE — ED Notes (Signed)
12:29 AM :Pt care assumed from Dr. Kathlene Cote, ED provider. Pt complaint(s), current treatment plan, progression and available diagnostic results have been discussed thoroughly.  Rounding occurred: yes  Intended Disposition: TBD   Pending diagnostic reports and/or labs (please list): [facility] Evaluation.      12:46 AM  Patient evaluated by [facility]. Will get a TDO and have patient admitted.  Patient is medically stable.    8:01 AM : Pt care transferred to Dr. Ozella Almond  ,ED provider. History of patient complaint(s), available diagnostic reports and current treatment plan has been discussed thoroughly.   Bedside rounding on patient occured : yes .  Intended disposition of patient : Crisis bed  Pending diagnostics reports and/or labs (please list): placement/Crisis admission                Scribe Attestation     Pearletha Alfred acting as a scribe for and in the presence of Joneen Boers, DO      October 23, 2017 at 12:28 AM       Provider Attestation:      I personally performed the services described in the documentation, reviewed the documentation, as recorded by the scribe in my presence, and it accurately and completely records my words and actions. October 23, 2017 at 12:28 AM - Joneen Boers, DO

## 2017-10-23 NOTE — Behavioral Health Treatment Team (Cosign Needed)
GROUP THERAPY PROGRESS NOTE    Kimberly [name] is participating in Recreational Therapy.     Group time: 1 hour    Personal goal for participation: fresh air    Goal orientation: social    Group therapy participation: minimal    Therapeutic interventions reviewed and discussed: Patient walked outdoors under the bushes crawling and picking up  Sticks she was redirected by staff to return inside., she complied    Impression of participation:confused

## 2017-10-23 NOTE — ED Notes (Signed)
8:10 AM :Pt care assumed from Dr. Loman Brooklyn, ED provider. Pt complaint(s), current treatment plan, progression and available diagnostic results have been discussed thoroughly.  Rounding occurred: yes  Intended Disposition: ADMIT   Pending diagnostic reports and/or labs (please list): Patient is waiting for bed, seen by crisis    Reassessed patient, no apparent distress, cooperative.    Patient admitted at [facility] behavioral

## 2017-10-23 NOTE — Behavioral Health Treatment Team (Signed)
M.H.T. Note: Pt was checked for contraband upon admission on the unit. Pt was given a copy of the rules upon admission.

## 2017-10-23 NOTE — Behavioral Health Treatment Team (Signed)
Patient is currently speaking gibberish, patient is loud and yelling, patient is unable to be redirected, patient was moved to another room due to her disturbing her roommate. The patient continues to be loud and is speaking gibberish, and is climbing in the window, patient given Haldol IM in Right buttocks for psychosis will continue to monitor.

## 2017-10-23 NOTE — Progress Notes (Addendum)
Patient was admitted at this time for change in her mental status. She start to pray and speaking nonsensical words and sounds that she refer to "speaking in tongue" in her religion. She had her eyes closed and hands raised. After she completed praying she answered assessment questions. She confirmed that she lives with her mother. She is aware that she is acting inappropriately , talking to herself, laughing out loud at times and talking with people that aren't there. She denies alcohol or drug use. Oriented to unit and guidelines.

## 2017-10-23 NOTE — ED Notes (Signed)
TRANSFER - OUT REPORT:    Verbal report given to Kim, RN(name) on Wendie Simmer  being transferred to 102(unit) for routine progression of care       Report consisted of patient???s Situation, Background, Assessment and   Recommendations(SBAR).     Information from the following report(s) SBAR, MAR, Recent Results and Med Rec Status was reviewed with the receiving nurse.    Lines:       Opportunity for questions and clarification was provided.      Patient transported with:  Security  PPD

## 2017-10-24 LAB — GLUCOSE, POC: Glucose (POC): 119 mg/dL — ABNORMAL HIGH (ref 70–110)

## 2017-10-24 MED ORDER — LITHIUM CARBONATE SR 300 MG TAB
300 mg | Freq: Two times a day (BID) | ORAL | Status: DC
Start: 2017-10-24 — End: 2017-10-27
  Administered 2017-10-25 – 2017-10-26 (×3): via ORAL

## 2017-10-24 MED ORDER — HALOPERIDOL 5 MG TAB
5 mg | Freq: Every evening | ORAL | Status: DC
Start: 2017-10-24 — End: 2017-10-27
  Administered 2017-10-25: via ORAL

## 2017-10-24 MED FILL — HALOPERIDOL LACTATE 5 MG/ML IJ SOLN: 5 mg/mL | INTRAMUSCULAR | Qty: 1

## 2017-10-24 MED FILL — CEPHALEXIN 250 MG CAP: 250 mg | ORAL | Qty: 2

## 2017-10-24 MED FILL — MAPAP (ACETAMINOPHEN) 325 MG TABLET: 325 mg | ORAL | Qty: 2

## 2017-10-24 NOTE — Other (Signed)
ART THERAPY GROUP PROGRESS NOTE    PATIENT SCHEDULED FOR GROUP AT: 11:15    ATTENDANCE: Full    PARTICIPATION LEVEL: Participates fully in the art process    ATTENTION LEVEL: Able to focus on task    FOCUS: Identify emotions     SYMBOLIC & THEMATIC CONTENT AS NOTED IN IMAGERY: She was religiously preoccupied and had difficulty following the directives as given. She initially presented with a smile on her face, then half way through group began crying to herself. Upon group discussion she claimed that she "has no stress because [she] has given it to the [place]."

## 2017-10-24 NOTE — Behavioral Health Treatment Team (Signed)
During change of shift rounds pt was in room 106, pt was redirected and escorted by staff to her room

## 2017-10-24 NOTE — Behavioral Health Treatment Team (Cosign Needed)
Writer observed pt during shift. Pt required a lot of redirection and reminding not to go into the restroom. Pt have been seen on multiple occasions throwing up nursing staff has been notified. Pt is also holding her breath while getting her blood pressure taken. Pt understands questions that are being asked such as do you know where you are and do you know who I am and answers all questions correctly. Pt has to be escorted to her room because she is unsteady on her feet. Pt look into space and mumble to herself. Pt did not have any falls during shift.  Staff encourage pt to participate in recovery plan. Writer will continue to observe pt for further improvements.

## 2017-10-24 NOTE — Other (Signed)
OCCUPATIONAL THERAPY PROGRESS NOTE    Group time:1430    The patient did not refuse, but, did not come to group.  Unclear if focused long enough to be able to attend.  Found in room with many towels and washcloths (apparently "cleaning") and having stuffed enough toilet paper in the toilet that it didn't flush properly.  Staff notified and reality orientation attempted.

## 2017-10-24 NOTE — Behavioral Health Treatment Team (Signed)
Patient was sitting in day area and assistance back to her room. Patient noted to continuously look up at ceiling. She was also noted drooling with mouth open. Vitals 98.3-92 -16-149/86. On call psychiatric made aware. Order received for Cogentin 1 mg IM

## 2017-10-24 NOTE — Behavioral Health Treatment Team (Signed)
GROUP THERAPY PROGRESS NOTE    Kimberly [name] is participating in Community Group    Goal orientation: community    Group therapy participation: minimal    Therapeutic interventions reviewed and discussed:   Unit guidelines and daily routine were reviewed.  Patients were encouraged to complete their Daily Patients Goals sheet.    Impression of participation:   Chidera was attentive during Community Group.  She did not complete her Daily Goal Sheet.

## 2017-10-24 NOTE — Other (Signed)
SW assessment/Intervention:  Patient is a 57 year old [place] American female who is reported to have hallucinations.  Patient is observed in her room and is polite upon approach.  Patient reports she has been hospitalized in the past because her son realized she could hear voices others could not.  Patient endorses AVH.  She reports she has seen things in the woods and is unsure of what they were.  Patient begins talking about the bible and began quoting scriptures.  Staff reports: Patient has been in her room most of the morning. She was medication complaint. In her room wiping down walls and bathroom. Preoccupied with religion.  SW encouraged patient to engage in group activities as well as positive peer interaction.  SW will continue to monitor patient as needed.    Humberto Leep, LCSW-E

## 2017-10-24 NOTE — Behavioral Health Treatment Team (Signed)
Patient slept 6 hours during the shift will continue to monitor.

## 2017-10-24 NOTE — Behavioral Health Treatment Team (Signed)
Patient reassessed patient mouth noted to be closed. She was no longer gazing at the ceiling. Writer asked patient how was she feeling. She stated "I'm okay"

## 2017-10-24 NOTE — H&P (Addendum)
[facility]  Inpatient Admission Note    Date of Service:  10/24/17    Historian(s): Wendie Simmer and chart review  Referral Source: [facility] ED    Chief Complaint   Acute psychosis    History of Present Illness     Kimberly [name] is a 57 y.o. BLACK OR AFRICAN AMERICAN female with a history of Schizophrenia who was admitted under TDO from out ED for acute psychosis and possible mania. Pt is a fair Historian today. She received a shot of Haldol '5mg'$  last night which may have cleared up some of her psychosis but she till remains psychotic today nevertheless.    Pt says "my sons wanted me to get evaluated because I went through the wild. I was walking with Jesus. I guess I was just praising the Reita Cliche too much for everybody." According to ED notes, sons said she was acting bizarre, she had been found rolling on the ground in the woods talking about how she was having a party with God. In the Ed she was actively hallucinating and responding to internal stimuli with much inappropriate laughter. Nurse also reports that when she came to the unit, she was talking loudly to herself, praying in tongues and pacing with her arms raised.    Pt is able to respond to questions appropriately today. She endorses auditor hallucinations and says she has a gift because 'I can hear people talking to me." She denies depressed mood or SI. Her mood is rather euphoric and she laughs a lot. She says she has had periods of expansive mood with a lot of talkativeness in the past which she describes as "preaching." She says her energy level is "off the chain" today and that she has ben sleeping well. Six hours of sleep was documented for last night.    Pt denies use of drugs or alcohol.    Psychiatric Review of Systems   See HPI    Medical Review of Systems     Sleep: fair  Appetite: good    10 point review of systems was completed.  Significant findings are found in the HPI or MSE.      Psychiatric Treatment History      Self-injurious behavior/risky thoughts or behaviors (past suicidal ideation/attempt):   Denies any prior history of thoughts of self-harm or suicidal actions.    Violence/Risk to others (past homicidal ideation/attempt):   Denies any prior history of violence or homicidal ideation.    Previous psychiatric medication trials: Haldol    Previous psychiatric hospitalizations: Pt reports being hospitalized in [place] in the past and diagnosed with Schizophrenia. Last hospitalization in 2016. Pt has been non-compliant with treatment since then.    Current therapist: None    Current psychiatric provider: None    Allergies      Allergies   Allergen Reactions   ??? Ativan [Lorazepam] Unknown (comments)     Pt unclear.       Medical History     Past Medical History:   Diagnosis Date   ??? Cancer ([place])    ??? Psychiatric disorder     schzophrenia     Pt has a history of metastatic breat cancer diagnosed in 2017. She has had bilateral mastectomy and completed chemotherapy in August 2018. She also had reconstructive breast surgery in May 2019.    Medication(s)     Prior to Admission Medications   Prescriptions Last Dose Informant Patient Reported? Taking?   diphenhydrAMINE (BENADRYL)  25 mg tablet Unknown at Unknown time  Yes No   Sig: Take 25 mg by mouth every six (6) hours as needed for Sleep.   ondansetron hcl (ZOFRAN) 8 mg tablet Unknown at Unknown time  Yes No   Sig: Take 8 mg by mouth every eight (8) hours as needed for Nausea.   therapeutic multivitamin-minerals (THERAGRAN-M) tablet Unknown at Unknown time  Yes No   Sig: Take 1 Tab by mouth daily.      Facility-Administered Medications: None         Substance Abuse History     Tobacco: denied  Alcohol: denied  Marijuana: denied  Cocaine: denied  Opiate: denied  Benzodiazepine: denied  Other: denied    Consequences: none    History of detox: none    History of substance abuse treatment: none    Family History     History reviewed. No pertinent family history.     Psychiatric Family History  Sister and aunt with suicide attempts    Family history of suicide? No    Social History     Pt was born and raised in [place]. She currently resides in [place] with her mother. 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 She moved back here last year. She graduated [employer] and has some college. She is single with two adult sons. 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 She denies legal history.    Vitals/Labs      Vitals:    10/23/17 1436 10/23/17 1942 10/24/17 0811 10/24/17 0818   BP:  126/78  134/84   Pulse:  98  97   Resp:  16  16   Temp:    97.8 ??F (36.6 ??C)   SpO2:       Weight: 62.1 kg (137 lb)  62.1 kg (137 lb)    Height: (!) 5.5" (0.14 m)  5' 5" (1.651 m)      Physical exam:  Constituational: Patient is afebrile, vital signs reviewed, patient is uncomfortable in no acute distress.    Head: Atraumatic, normocephalic  Eyes: Normal inspection. No conjunctival injection or discharge.  ENT:  No facial bruises, normal external ear and nose inspection.   Neck:  Supple, non tender. normal inspection.    Cardiovascular:  regular rate and rhythm, heart sounds normal without murmurs.  Radial pulses 2+ and equal bilaterally  Respiratory:  No respiratory distress, breath sounds normal.  No rales or wheezing  Abdomen: soft, nontender, nondistended, no rebound or guarding, normoactive bowel sounds  Back: non-tender. No CVA tenderness.  Musculoskeletal:  normal ROM, non-tender, no pedal edema.  Calves soft, symmetric, and non-tender with no palpable cords.   Skin: color normal, no rash, warm, dry .  Neuro: awake & alert oriented ??3, no motor/sensory deficit. Upper extremity and lower extremity strength and sensation are grossly normal, intact, and symmetric.  CN 3-12 normal, intact, and symmetric.  Psych: Speaking with auditory hallucinations      Labs:    Results for orders placed or performed during the hospital encounter of 10/22/17   CBC WITH AUTOMATED DIFF   Result Value Ref Range    WBC 9.0 4.6 - 13.2 K/uL    RBC 3.62 (L) 4.20 - 5.30 M/uL    HGB 10.8 (L) 12.0 - 16.0 g/dL    HCT 33.8 (L) 35.0 -  45.0 %    MCV 93.4 74.0 - 97.0 FL    MCH 29.8 24.0 - 34.0 PG    MCHC 32.0 31.0 - 37.0 g/dL    RDW 15.5 (H) 11.6 - 14.5 %    PLATELET 417 135 - 420 K/uL    MPV 9.1 (L) 9.2 - 11.8 FL    NEUTROPHILS 77 (H) 40 - 73 %    LYMPHOCYTES 14 (L) 21 - 52 %    MONOCYTES 9 3 - 10 %    EOSINOPHILS 0 0 - 5 %    BASOPHILS 0 0 - 2 %    ABS. NEUTROPHILS 6.9 1.8 - 8.0 K/UL    ABS. LYMPHOCYTES 1.2 0.9 - 3.6 K/UL    ABS. MONOCYTES 0.8 0.05 - 1.2 K/UL    ABS. EOSINOPHILS 0.0 0.0 - 0.4 K/UL    ABS. BASOPHILS 0.0 0.0 - 0.1 K/UL    DF AUTOMATED     METABOLIC PANEL, COMPREHENSIVE   Result Value Ref Range    Sodium 140 136 - 145 mmol/L    Potassium 4.1 3.5 - 5.5 mmol/L    Chloride 107 100 - 108 mmol/L    CO2 23 21 - 32 mmol/L    Anion gap 10 3.0 - 18 mmol/L    Glucose 65 (L) 74 - 99 mg/dL    BUN 23 (H) 7.0 - 18 MG/DL    Creatinine 0.98 0.6 - 1.3 MG/DL    BUN/Creatinine ratio 23 (H) 12 - 20      GFR est AA >60 >60 ml/min/1.15m    GFR est non-AA 59 (L) >60 ml/min/1.798m   Calcium 9.4 8.5 - 10.1 MG/DL    Bilirubin, total 0.6 0.2 - 1.0 MG/DL    ALT (SGPT) 34 13 - 56 U/L    AST (SGOT) 61 (H) 15 - 37 U/L    Alk. phosphatase 57 45 - 117 U/L    Protein, total 7.4 6.4 - 8.2 g/dL    Albumin 4.2 3.4 - 5.0 g/dL    Globulin 3.2 2.0 - 4.0 g/dL    A-G Ratio 1.3 0.8 - 1.7     SALICYLATE   Result Value Ref Range    Salicylate level <1<8.9L) 2.8 - 20.0 MG/DL   URINALYSIS W/ RFLX MICROSCOPIC   Result Value Ref Range    Color YELLOW      Appearance CLEAR      Specific gravity >1.030 (H) 1.005 - 1.030    pH (UA) 5.0 5.0 - 8.0      Protein TRACE (A) NEG mg/dL    Glucose NEGATIVE  NEG mg/dL    Ketone 80 (A) NEG mg/dL    Bilirubin NEGATIVE  NEG      Blood NEGATIVE  NEG      Urobilinogen 1.0 0.2 - 1.0 EU/dL     Nitrites NEGATIVE  NEG      Leukocyte Esterase SMALL (A) NEG     HCG URINE, QL   Result Value Ref Range    HCG urine, QL NEGATIVE  NEG     ETHYL ALCOHOL   Result Value Ref Range    ALCOHOL(ETHYL),SERUM <3 0 - 3 MG/DL   DRUG SCREEN, URINE   Result Value Ref Range    BENZODIAZEPINES NEGATIVE  NEG      BARBITURATES NEGATIVE  NEG      THC (TH-CANNABINOL) NEGATIVE  NEG      OPIATES NEGATIVE  NEG      PCP(PHENCYCLIDINE) NEGATIVE  NEG      COCAINE NEGATIVE  NEG      AMPHETAMINES NEGATIVE  NEG      METHADONE NEGATIVE  NEG      HDSCOM (NOTE)    ACETAMINOPHEN   Result Value Ref Range    Acetaminophen level <2 (L) 10.0 - 30.0 ug/mL   URINE MICROSCOPIC ONLY   Result Value Ref Range    WBC 11 to 20 0 - 4 /hpf    RBC 0 to 3 0 - 5 /hpf    Epithelial cells 2+ 0 - 5 /lpf    Bacteria FEW (A) NEG /hpf    Mucus 3+ (A) NEG /lpf    Hyaline cast 36 to 50 0 - 2 /lpf       Mental Status Examination     Appearance/Hygiene 57 y.o. BLACK OR AFRICAN AMERICAN female  Hygiene: good   Behavior/Social Relatedness Appropriate   Musculoskeletal Gait/Station: appropriate  Tone (flaccid, cogwheeling, spastic): not assessed  Psychomotor (hyperkinetic, hypokinetic): calm  Involuntary movements (tics, dyskinesias, akathisa, stereotypies): none   Speech   Rate, rhythm, volume, fluency and articulation are appropriate   Mood   euphoric   Affect    congruent   Thought Process Linear and goal directed    Vagueness, incoherence, circumstantiality, tangentiality, neologisms, perseveration, flight of ideas, or self-contradictory statements not present on assessment   Thought Content and Perceptual Disturbances Denies delusions, ideas of reference, overvalued ideas, ruminations, obsession, compulsions, and phobias    Denies self-injurious behavior (SIB), suicidal ideation (SI), aggressive behavior or homicidal ideation (HI)    Denies auditory and visual hallucinations   Sensorium and Cognition  AOx4, attention intact, memory intact, language  use appropriate, and fund of knowledge age appropriate   Insight  limited   Judgment limited       Suicide Risk Assessment     Admission  Date/Time: 10/24/17    [x] Admission  [] Discharge     Key Factors:   Current admission precipitated by suicide attempt?  []  Yes     2    [x]  No     1     Suicide Attempt History  [] Past attempts of high lethality    2 []  Past attempts of low lethality    1 [x]  No previous attempts       0   Suicidal Ideation []  Constant suicidal thoughts      2 []  Intermittent or fleeting suicidal  thoughts  1 [x]  Denies current suicidal thoughts    0   Suicide Plan   []  Has plan with actual OR potential access to planned method    2 []  Has plan without access to planned method      1 [x]  No plan            0   Plan Lethality []  Highly lethal plan (Carbon monoxide, gun, hanging, jumping)    2 []  Moderate lethality of plan          1 []  Low lethality of plan (biting, head banging, superficial scratching, pillow over face)  0   Safety Plan Agreement  []  Unwilling OR unable to agree due to impaired reality testing   2   []  Patient is ambivalent and/or guarded      1 []  Reliably agrees        0   Current Morbid Thoughts (reunion fantasies,  preoccupations with death) []  Constantly     2     []  Frequently    1 [x]  Rarely    0   Elopement Risk  []  High risk     2 []  Moderate risk    1 [x]   Low risk    0   Symptoms    []  Hopeless  []  Helpless  []  Anhedonia   []  Guilt/shame  []  Anger/rage  []  Anxiety  []  Insomnia   []  Agitation   []  Impulsivity  []  5-6 symptoms present    2 []  3-4 symptoms present    1  [x]  0-2 symptoms present    0     Total Score: 1  --------------------------------------------------------------------------------------------------------------  Subjective Appraisal of Risk:  []  Patient replies not trustworthy: several non-verbal cues.  []  Patient replies questionable: trustworthy: at least 1 non-verbal cue.  [x]  Patient replies appear trustworthy.     Protective measures (select all that apply):  []  Successful past responses to stress  []  Spiritual/religious beliefs  []  Capacity for reality testing  []  Positive therapeutic relationships  [x]  Social supports/connections  []  Positive coping skills  []  Frustration tolerance/optimism  []  Children or pets in the home  []  Sense of responsibility to family  []  Agrees to treatment plan and follow up    High Risk Diagnoses (select all that apply):  [x]  Depression/Bipolar Disorder  []  Dual Diagnosis  []  Cardiovascular Disease  [x]  Schizophrenia  []  Chronic Pain  []  Epilepsy  []  Cancer  []  Personality Disorder  []  HIV/AIDS  []  Multiple Sclerosis    Dangerousness Assessment (Suicide, homicide, property destruction...)    Risk Factors reviewed and risk assessed to be:  [x] low   [] low-moderate   [] moderate   [] moderate-high   [] high     Protection factors reviewed and risk assessed to be:  [x] low   [] low-moderate   [] moderate   [] moderate-high   [] high     Response to treatment and risk assessed to be:  [x] low   [] low-moderate   [] moderate   [] moderate-high   [] high     Support reviewed and risk assessed to be:  [x] low   [] low-moderate   [] moderate   [] moderate-high   [] high     Acceptance of Discharge and outpatient treatment reviewed and risk assessed to be:    [x] low   [] low-moderate   [] moderate   [] moderate-high   [] high   Overall risk assessed to be:  [x] low   [] low-moderate   [] moderate   [] moderate-high   [] high       Assessment and Plan     Psychiatric Diagnoses:   Patient Active Problem List   Diagnosis Code   ??? Schizoaffective disorder, bipolar type ([place]) F25.0       Medical Diagnoses: History of breast CA. Anemia    Psychosocial and contextual factors: Treatment non-compliance    Level of impairment/disability: severe    Kimberly [name] is a 57 y.o. who is currently requiring acute stabilization after presenting in manic and psychotic state.      1. Admit to locked  inpatient behavioral health unit. Start milieu, group, art and occupation therapy.  2. Start Haldol and Lithium for Schizoaffective Disorder.  3. Routine labs ordered and reviewed by this provider.  4. Reviewed instructions, risks, benefits and side effects.   5. Start disposition planning; verify upcoming outpatient appointments with therapist and/or psychiatric medication prescriber.   6. Tentative date of discharge: 3-5 days       Luther Redo, MD  [facility]

## 2017-10-24 NOTE — Progress Notes (Signed)
Problem: Altered Thought Process (Adult/Pediatric)  Goal: *STG: Remains safe in hospital  Description  Patient will be assessed daily for safety.   Outcome: Progressing Towards Goal     Problem: Altered Thought Process (Adult/Pediatric)  Goal: *STG: Complies with medication therapy  Description  Patient will take medication as prescribed daily.   Outcome: Progressing Towards Goal     Problem: Altered Thought Process (Adult/Pediatric)  Goal: *STG: Decreased hallucinations  Description  Patient will be assessed for auditory and visual hallucinations daily and will have a decreased /absence of them by discharge.   Outcome: Progressing Towards Goal  Patient has been in her room most of the morning. She was medication complaint. In her room wiping down walls and bathroom. Preoccupied with religion.

## 2017-10-24 NOTE — Behavioral Health Treatment Team (Signed)
Pt accepted her son during visitation.  Pt presented selectively mute and bizarre.  Pt's son was reassured that pt was being taken care of.  Son stated that pt had recently been taking chemotherapy and had recently had surgery for breast cancer.  Pt became tearful and upset during visitation.  Pt's vital signs were assessed and were wnl (see flow sheet).

## 2017-10-25 ENCOUNTER — Inpatient Hospital Stay: Admit: 2017-10-25 | Payer: PRIVATE HEALTH INSURANCE | Primary: Internal Medicine

## 2017-10-25 MED ORDER — BENZTROPINE 1 MG/ML IJ SOLN
1 mg/mL | Freq: Four times a day (QID) | INTRAMUSCULAR | Status: DC | PRN
Start: 2017-10-25 — End: 2017-10-27
  Administered 2017-10-25: 03:00:00 via INTRAMUSCULAR

## 2017-10-25 MED ORDER — BENZTROPINE 1 MG TAB
1 mg | Freq: Every evening | ORAL | Status: DC
Start: 2017-10-25 — End: 2017-10-27
  Administered 2017-10-26: via ORAL

## 2017-10-25 MED FILL — HALOPERIDOL 5 MG TAB: 5 mg | ORAL | Qty: 1

## 2017-10-25 MED FILL — BENZTROPINE 1 MG TAB: 1 mg | ORAL | Qty: 1

## 2017-10-25 MED FILL — LITHIUM CARBONATE SR 300 MG TAB: 300 mg | ORAL | Qty: 1

## 2017-10-25 MED FILL — CEPHALEXIN 250 MG CAP: 250 mg | ORAL | Qty: 2

## 2017-10-25 MED FILL — BENZTROPINE 1 MG/ML IJ SOLN: 1 mg/mL | INTRAMUSCULAR | Qty: 2

## 2017-10-25 MED FILL — HALOPERIDOL LACTATE 5 MG/ML IJ SOLN: 5 mg/mL | INTRAMUSCULAR | Qty: 1

## 2017-10-25 NOTE — Progress Notes (Signed)
Problem: Altered Thought Process (Adult/Pediatric)  Goal: *STG: Complies with medication therapy  Description  Patient will take medication as prescribed daily.   Outcome: Progressing Towards Goal  Note:   Medication compliant.  Goal: *STG: Decreased hallucinations  Description  Patient will be assessed for auditory and visual hallucinations daily and will have a decreased /absence of them by discharge.   Outcome: Progressing Towards Goal  Note:   Hallucinations are decreasing.  Patient calm and cooperative with staff and care. Pt denies SI. Contracts for safety. Pt is guarded with nurse but symptoms seem to be improving. No unsafe behaviors. No acute distress. Able to make needs known. Pt is very caring with staff, she shares her snacks and has given a young girl some clothing, she did state that God tells her to care for others and when she showered with her cloths on last night God told her to do so.

## 2017-10-25 NOTE — Behavioral Health Treatment Team (Signed)
The rounds during connect care down time are in the patients chart. Please refer to the sleep chart sheet that was filled out during that time.

## 2017-10-25 NOTE — Behavioral Health Treatment Team (Signed)
Pt stated "what is that green pill" pt was informed the medication was Haldol 5 mg that was scheduled by her Dr.  Abbott Pao then stated "I havent talked to no doctor today". Pt was encouraged to take medication and discuss further with Dr. Marylene Buerger. Pt then stated "Not gonna be pushing all this mess up in me".  Pt refused scheduled medication 5 mg haldol po

## 2017-10-25 NOTE — Behavioral Health Treatment Team (Signed)
GROUP THERAPY PROGRESS NOTE    Kimberly [name] did not participate in Goals Group.     Group time: 30 minutes    Personal goal for participation: n/a    Goal orientation: personal    Therapeutic interventions reviewed and discussed:  Setting personal goals and plans to achieve them    Impression of participation: Pt was encouraged to attend group but refused

## 2017-10-25 NOTE — Progress Notes (Signed)
[place]  Inpatient Progress Note     Date of Service: 10/25/17  Hospital Day: 2     Subjective/Interval History   10/25/17    Treatment Team Notes:  Notes reviewed and/or discussed and report that Kimberly [name] is a 57 y/o female who was admitted under TDO for acute psychosis. Pt was involuntarily committed by the court today. Per nursing report, pt had bizarre behavior overnight. Pt was observed gazing at the ceiling for a long period of time with her mouth open and also drooling. She received Cogentin 1mg  PO and IM. She also took a shower with her clothes on and proceeded to walk out of her room with the wet clothes.    Patient interview: Kimberly [name] was interviewed by this Probation officer today. Pt is focused on being discharged today. She was informed she has been IC by the court but she does not seem to understand what that means. She insists it is her right for her to leave the hospital right now. She believes there is nothing wrong with her and "nothing wrong with having a party with Jesus." She has been compliant with medication regimen. I have attempted to call her son Kimberly [name] but he did not answer the phone.      Objective     Visit Vitals  BP 123/74   Pulse 87   Temp 98 ??F (36.7 ??C)   Resp 16   Ht 5\' 5"  (1.651 m)   Wt 62.1 kg (137 lb)   SpO2 100%   Breastfeeding? No   BMI 22.80 kg/m??       Recent Results (from the past 24 hour(s))   GLUCOSE, POC    Collection Time: 10/24/17  6:58 PM   Result Value Ref Range    Glucose (POC) 119 (H) 70 - 110 mg/dL       Mental Status Examination     Appearance/Hygiene 57 y.o. BLACK OR AFRICAN AMERICAN female  Hygiene: fair   Behavior/Social Relatedness Appropriate   Musculoskeletal Gait/Station: appropriate  Tone (flaccid, cogwheeling, spastic): not assessed  Psychomotor (hyperkinetic, hypokinetic): calm   Involuntary movements (tics, dyskinesias, akathisa, stereotypies): none    Speech   Rate, rhythm, volume, fluency and articulation are appropriate   Mood   labile   Affect    congruent   Thought Process Linear and goal directed   Thought Content and Perceptual Disturbances Denies self-injurious behavior (SIB), suicidal ideation (SI), aggressive behavior or homicidal ideation (HI)    Denies auditory and visual hallucinations   Sensorium and Cognition  Grossly intact   Insight  poor   Judgment limited        Assessment/Plan      Psychiatric Diagnoses:   Patient Active Problem List   Diagnosis Code   ??? Schizoaffective disorder, bipolar type ([place]) F25.0       Medical Diagnoses: History of breast cancer    Psychosocial and contextual factors: unemployment, health problems    Level of impairment/disability: severe    Kimberly [name] is a 57 y.o. who is currently admitted for psychosis.       1.  Continue current treatment plan. Add Cogentin 1mg  every evening.  2.  Reviewed instructions, risks, benefits and side effects of medications  3.  Disposition/Discharge Date: self-care/home, TBD.    Luther Redo, MD  [facility]  Psychiatry

## 2017-10-25 NOTE — Behavioral Health Treatment Team (Cosign Needed)
M.H.T observed Pt throughout the day.  Pt was compliant throughout  The day and required no redirection from staff.  Pt participated in groups throughout the day as well as interact with others in the day area.  Pt displayed an flat affect but had her moments when she was feeling good and singing gospel songs.  Pt began to display a brighter affect as the went on.

## 2017-10-25 NOTE — Other (Addendum)
ART THERAPY GROUP PROGRESS NOTE    PATIENT SCHEDULED FOR GROUP AT: 1500    ATTENDANCE: Full    PARTICIPATION LEVEL: Participates fully in the art process    ATTENTION LEVEL: Able to focus on task    FOCUS: Reality Orientation     SYMBOLIC & THEMATIC CONTENT AS NOTED IN IMAGERY: She presented with a bright affect and was invested in the task at hand. Her approach to task and imagery initially was controlled and logical, however, the more she added the more loose, bizarre, and expansive imagery and associations became. There were additions out of place with themes of religious preoccupation. For example, she included "the face of Jesus" on her image that initially was an image completion of the other half of a strawberry short cake. She also wrote "the blood of Jesus" "tomato," "strawberries," and other loose associations on her work. Her associations and speech were tangential and loose as she went on to explain how her artwork represented her "journey through the jungle where she made it through and met God."

## 2017-10-25 NOTE — Behavioral Health Treatment Team (Signed)
Patient got in her shower with all her clothes on and walked out in the hallway. Staff assisted with cleaning the patient, and changing bed linens will continue to monitor.

## 2017-10-25 NOTE — Behavioral Health Treatment Team (Signed)
Patient slept 4 hours during the shift will continue to monitor.

## 2017-10-25 NOTE — Behavioral Health Treatment Team (Signed)
Patient son would like the physican to call him, regarding the patient will continue to monitor.

## 2017-10-25 NOTE — Other (Signed)
OCCUPATIONAL THERAPY PROGRESS NOTE    Group Time:  3383  Attendance:    The patient attended full group..  The patient left and returned to activity at least once.  Participation:  The patient participated with minimal elaboration in the activity.   Attention:.  The patient needed frequent redirection to activity..  Interaction:  The patient acknowledges others or responds to questions,  with no spontaneous interaction..  The patient shows limited awareness of others.  Seemed preoccupied for part of activity.  Was able to be redirected and responses to simple concrete questions (had to be reminded to answer) were generally appropriate.  Said very little in responses.

## 2017-10-25 NOTE — Other (Signed)
SW assessment/Intervention:  SW made contact with patient as she remained in the milieu.  Patient reports she is ready to return home.  Patients thoughts were logical and made sense.  Patients affect is brighter as she received snack and personal items from family.  Dr. Ernesto Rutherford reports:  Kimberly [name] is a 57 y/o female who was admitted under TDO for acute psychosis. Pt was involuntarily committed by the court today. Per nursing report, pt had bizarre behavior overnight.  SW will contact son to gather additional information to assist with treatment.  SW will continue to monitor patient as needed.    Humberto Leep, LCSW-E

## 2017-10-25 NOTE — Behavioral Health Treatment Team (Signed)
Treatment team met -     Medical Director:                                _x____present        Psychiatrist:                                        _x____present      Charge nurse:                                     _x____present           MSW:                                                __x___present      Administrative Director:                      _x____present      Nurse Manager:                                  _x____present      Student RNs:                                      _____present      Medical Students:                               _____present      Art Therapist:                                      _x____present   Clinical Coordinator:                           _x____present   Internal Case Manager:                      _x____present        Plan of care discussed and updated as appropriate.

## 2017-10-25 NOTE — Progress Notes (Addendum)
Spirituality Group was attended by Kimberly [name], who is a 56 y.o.,female. Patient???s Primary Language is: [place].   According to the patient???s EMR Religious Affiliation is: Pentecostal.     The reason the Patient came to the hospital is:   Patient Active Problem List    Diagnosis Date Noted   ??? Schizoaffective disorder, bipolar type ([place]) 10/23/2017        Chaplain conducted Spirituality Group and explored themes on the topic of using guided imagery to find a place of peace and relaxation. We talked about the value of having this safe place in your mind to feel happy, calm, and be affirmed, to refresh our spirit, process feelings, be more creative, self-aware, and connect with something greater than ourselves.   .  Listened empathically and offered a safe place to share beliefs and feelings. Kimberly [name] was positive and considerate in the group. She chose her church as her safe place. It is a place of peace for her. She said she is concerned about being able to go back to her home "that I paid for" when she gets out of the hospital. We prayed for that and her sense of security and her home being a safe place..   Provided information about Spiritual Care Services.    [place]   [phone]

## 2017-10-26 MED FILL — CEPHALEXIN 250 MG CAP: 250 mg | ORAL | Qty: 2

## 2017-10-26 MED FILL — BENZTROPINE 1 MG TAB: 1 mg | ORAL | Qty: 1

## 2017-10-26 MED FILL — LITHIUM CARBONATE SR 300 MG TAB: 300 mg | ORAL | Qty: 1

## 2017-10-26 NOTE — Other (Signed)
SW assessment/Intervention:  SW made contact with patient as she engaged in group activity with peers.  Patients affect is bright and polite.  Patient reports she is feeling much better today and is prepared for discharge.  Patient expressed she is not in need of psychiatric treatment.  Nursing staff reports patient has refused all medications.  Patient denies SI/HI.  Patient denies AVH.  SW will assist with treatment as needed.    Humberto Leep, LCSW-E  Education officer, museum

## 2017-10-26 NOTE — Progress Notes (Signed)
[place]  Inpatient Progress Note     Date of Service: 10/26/17  Hospital Day: 3     Subjective/Interval History   10/26/17    Treatment Team Notes:  Notes reviewed and/or discussed and report that Kimberly [name] is a 57 y/o female who has been IC for psychosis. No acute vents overnight. No bizarre behaviors. Pt refused to take Haldol last night. Also refused to take scheduled morning dose of Haldol and Lithium this morning. Pt has attended groups. Tends to be religiously preoccupied.    Patient interview: Kimberly [name] was interviewed by this Probation officer today. Pt continues to be focused on discharge with poor insight into her mental illness. She does not believe she has a mental illness and does not plan on taking medications. Her thought process is organized today. She slept fairly last night. Will most likely discharge tomorrow as pt does not appear to be a threat to herself or others at this time and does not meet criteria for court ordered medications.    Objective     Visit Vitals  BP 127/72   Pulse 96   Temp 99.3 ??F (37.4 ??C)   Resp 16   Ht 5\' 5"  (1.651 m)   Wt 62.1 kg (137 lb)   SpO2 100%   Breastfeeding? No   BMI 22.80 kg/m??       No results found for this or any previous visit (from the past 24 hour(s)).    Mental Status Examination     Appearance/Hygiene 57 y.o. BLACK OR AFRICAN AMERICAN female  Hygiene: fair   Behavior/Social Relatedness Appropriate   Musculoskeletal Gait/Station: appropriate  Tone (flaccid, cogwheeling, spastic): not assessed  Psychomotor (hyperkinetic, hypokinetic): calm   Involuntary movements (tics, dyskinesias, akathisa, stereotypies): none   Speech   Rate, rhythm, volume, fluency and articulation are appropriate   Mood   labile   Affect    congruent   Thought Process Linear and goal directed   Thought Content and Perceptual Disturbances Denies self-injurious behavior (SIB), suicidal ideation (SI), aggressive behavior or homicidal ideation (HI)     Denies auditory and visual hallucinations   Sensorium and Cognition  Grossly intact   Insight  poor   Judgment limited        Assessment/Plan      Psychiatric Diagnoses:   Patient Active Problem List   Diagnosis Code   ??? Schizoaffective disorder, bipolar type ([place]) F25.0       Medical Diagnoses: History of breast cancer    Psychosocial and contextual factors: unemployment, health problems    Level of impairment/disability: severe    Kimberly [name] is a 57 y.o. who is currently admitted for psychosis.       1.  Continue current treatment plan.   2.  Reviewed instructions, risks, benefits and side effects of medications  3.  Disposition/Discharge Date: Discharge planning for tomorrow.    Luther Redo, MD  [facility]  Psychiatry

## 2017-10-26 NOTE — Behavioral Health Treatment Team (Signed)
Patient appeared to have slept  5 hrs.

## 2017-10-26 NOTE — Behavioral Health Treatment Team (Signed)
Patient complaining of heartburn and nausea. Writer informed patient on call psychiatrist had to be called for order. Patient stated "That's okay". She was given ice chips. She stated "The ice chip is helping me".

## 2017-10-26 NOTE — Behavioral Health Treatment Team (Signed)
Patient refused all night time medication except Keflex despite encouragement.

## 2017-10-26 NOTE — Progress Notes (Incomplete)
Problem: Altered Thought Process (Adult/Pediatric)  Goal: *STG: Remains safe in hospital  Description  Patient will be assessed daily for safety.   Outcome: Progressing Towards Goal  Note:   No unsafe behaviors.  Goal: *STG: Complies with medication therapy  Description  Patient will take medication as prescribed daily.   Outcome: Progressing Towards Goal  Note:   Medication compliant.  Goal: *STG: Decreased delusional thinking  Description  Patient will be assessed daily for delusional behavior and thought process.   Outcome: Progressing Towards Goal  Note:   Appears less delusional daily.

## 2017-10-26 NOTE — Behavioral Health Treatment Team (Cosign Needed)
GROUP THERAPY PROGRESS NOTE    Kimberly [name] is participating in Coping skills educational group.      Group time: 30 minutes    Personal goal for participation: Identify at least 2 coping skills to utilize with increased stressors.    Goal orientation: community    Group therapy participation: active    Therapeutic interventions reviewed and discussed: Identifying coping skills to utilize when presented with increased stressors.      Impression of participation: Calm

## 2017-10-26 NOTE — Behavioral Health Treatment Team (Cosign Needed)
GROUP THERAPY PROGRESS NOTE    ADRINA ARMIJO is participating in [place].     Group time: 15 minutes    Personal goal for participation:   Orientation to the unit    Goal orientation: community    Group therapy participation: active    Therapeutic interventions reviewed and discussed:   Unit guidelines and daily routine were reviewed.  Patients were encouraged to ask questions and voice their concerns about the unit.    Impression of participation:   Bettyjane was attentive during group.  She did not have any questions or voice any concerns.

## 2017-10-26 NOTE — Other (Signed)
OCCUPATIONAL THERAPY PROGRESS NOTE    Group Time:  9735  Attendance:    The patient attended 3/4 of group.  The patient left and returned to activity at least once.  Participation:.  The patient participated with minimal elaboration in the activity. .  Attention:  The patient needed redirection to activity at least once.  Interaction:  The patient acknowledges others or responds to questions,  with no spontaneous interaction.  In and out of group several times.  Answers on subject and appropriate.

## 2017-10-26 NOTE — Behavioral Health Treatment Team (Cosign Needed)
The writer has observed the patient's behavior throughout this shift (1500-2300). During this shift the patient was been cooperative and respectful toward the staff and peers. Patient was observed attending group sessions, eating dinner and snacks, and was accompany with visitors (son, brother and sister-in-law). Patient has also appeared in bed praying, and she stated that "I feel pretty good today". In addition, the patient has not displayed any disruptive behavior, and was compliant with scheduled prescribed medications, taken a shower (hygiene completed) and has been free of falls and other harms. Staff will continue to monitor the patient safety and well-being.

## 2017-10-26 NOTE — Other (Signed)
ART THERAPY GROUP PROGRESS NOTE    PATIENT SCHEDULED FOR GROUP AT: 13:00    ATTENDANCE: Full    PARTICIPATION LEVEL: Participates fully in the art process    ATTENTION LEVEL: Able to focus on task    FOCUS: [name]: she was calm, compliant, and presented with a bright affect. Her imagery as well as associations were more organized today than noted in previous art therapy groups. She spoke very softly and was difficult to hear/understand, however.

## 2017-10-27 MED ORDER — CEPHALEXIN 500 MG CAP
500 mg | ORAL_CAPSULE | Freq: Four times a day (QID) | ORAL | 0 refills | Status: AC
Start: 2017-10-27 — End: 2017-10-31

## 2017-10-27 MED ORDER — HALOPERIDOL 5 MG TAB
5 mg | ORAL_TABLET | Freq: Every evening | ORAL | 0 refills | Status: AC
Start: 2017-10-27 — End: ?

## 2017-10-27 MED FILL — BENZTROPINE 1 MG TAB: 1 mg | ORAL | Qty: 1

## 2017-10-27 MED FILL — LITHIUM CARBONATE SR 300 MG TAB: 300 mg | ORAL | Qty: 1

## 2017-10-27 MED FILL — CEPHALEXIN 250 MG CAP: 250 mg | ORAL | Qty: 2

## 2017-10-27 MED FILL — HALOPERIDOL 5 MG TAB: 5 mg | ORAL | Qty: 1

## 2017-10-27 NOTE — Behavioral Health Treatment Team (Signed)
Pt discharged to care of self. Pt denies SI. After care instructions reviewed with pt who verbalized understanding. Copy of after care and prescriptions provided. Belongings returned.

## 2017-10-27 NOTE — Behavioral Health Treatment Team (Signed)
Patient appeared to have slept 2 hrs.

## 2017-10-27 NOTE — Behavioral Health Treatment Team (Cosign Needed)
GROUP THERAPY PROGRESS NOTE    Kimberly [name] is participating in [place].     Group time: 30 minutes    Personal goal for participation: To get better, take medicine, finish acitivites    Goal orientation: personal    Group therapy participation: active    Therapeutic interventions reviewed and discussed: Community guidelines and discussing  personal goals

## 2017-10-27 NOTE — Discharge Summary (Signed)
[facility]  Inpatient Psychiatry   Discharge Summary     Admit date: 10/22/2017    Discharge date and time: 10/27/2017 12:06 PM    Discharge Physician: Luther Redo, MD    DISCHARGE DIAGNOSES     Psychiatric Diagnoses:   Patient Active Problem List   Diagnosis Code   ??? Schizoaffective disorder, bipolar type ([place]) F25.0       Level of impairment/disability: mild    HOSPITAL COURSE   Treatment Team Notes:  Notes reviewed and/or discussed and report that Kimberly [name] is a 57 y/o female who was admitted under TDO for acute psychosis. Pt was involuntarily committed by the court today. Per nursing report, pt had bizarre behavior overnight. Pt was observed gazing at the ceiling for a long period of time with her mouth open and also drooling. She received Cogentin 72m PO and IM. She also took a shower with her clothes on and proceeded to walk out of her room with the wet clothes.  ??  Patient interview: Kimberly [name] by this wProbation officertoday. Pt is focused on being discharged today. She was informed she has been IC by the court but she does not seem to understand what that means. She insists it is her right for her to leave the hospital right now. She believes there is nothing wrong with her and "nothing wrong with having a party with Jesus." She has been compliant with medication regimen. I have attempted to call her son TKathi Simpersbut he did not answer the phone.    Hospital Course: Pt admitted and monitored for psychosis. She was started on Lithium and Haldol which she refused to take. She was unwilling to take any psychotropic medications but took antibiotic for her UTI. She had poor insight into psychosis and did not believe she had a mental problem and was therefore not going to take any medication. Her psychosis decreased in the hospital without medication. She had one episode of bizarre behavior which was taking a shower with her clothes on. She was still religiously  preoccupied by time of discharge but this could be her baseline.   Pt was very eager for discharge and was not a behavior problem on the unit. She was able to converse normally with staff and did not present with florid psychosis by time of discharge. Her thought process was linear and organized. Given that she did not meet criteria for TDO, she was discharged as requested. She said she will follow up with [facility] for Therapy. She did not presents as a danger to self or others.    DISPOSITION/FOLLOW-UP     Disposition: home    Follow-up Appointments:   Follow-up Information     Follow up With Specialties Details Why Contact Info    BAnne Shutter MD Internal Medicine   [address]  [place] [address] [phone]         Patient will follow up on 11/03/17 @ 8:30am for initial intake   [facility] [address] [place] [address] [phone] Pt will call CThea Alken@ [phone]           MEDICATION CHANGES   Outpatient medications:  No current facility-administered medications on file prior to encounter.      No current outpatient medications on file prior to encounter.         Medications discontinued during hospitalization:  Medications  Discontinued During This Encounter   Medication Reason   ??? oxyCODONE-acetaminophen (PERCOCET) 5-325 mg per tablet Not A Current Medication   ??? OLANZapine (ZyPREXA zydis) disintegrating tablet 5 mg    ??? cephALEXin (KEFLEX) capsule 974 mg Duplicate Order   ??? diphenhydrAMINE (BENADRYL) 25 mg tablet Discontinued at Discharge   ??? therapeutic multivitamin-minerals (THERAGRAN-M) tablet Discontinued at Discharge   ??? ondansetron hcl (ZOFRAN) 8 mg tablet Discontinued at Discharge   ??? benztropine (COGENTIN) tablet 1 mg Patient Discharge   ??? benztropine (COGENTIN) injection 1 mg Patient Discharge   ??? lithium carbonate SR (LITHOBID) tablet 300 mg Patient Discharge   ??? haloperidol (HALDOL) tablet 5 mg Patient Discharge    ??? cephALEXin (KEFLEX) capsule 500 mg Patient Discharge   ??? acetaminophen (TYLENOL) tablet 650 mg Patient Discharge   ??? hydrOXYzine pamoate (VISTARIL) capsule 50 mg Patient Discharge   ??? benztropine (COGENTIN) tablet 1 mg Patient Discharge   ??? haloperidol (HALDOL) tablet 5 mg Patient Discharge   ??? haloperidol lactate (HALDOL) injection 5 mg Patient Discharge         Discharged medication:  Discharge Medication List as of 10/27/2017 11:47 AM      START taking these medications    Details   cephALEXin (KEFLEX) 500 mg capsule Take 1 Cap by mouth four (4) times daily for 4 days. Indications: Bacterial Urinary Tract Infection, Print, Disp-16 Cap, R-0      haloperidol (HALDOL) 5 mg tablet Take 1 Tab by mouth nightly. Indications: psychotic disorder, Print, Disp-30 Tab, R-0         STOP taking these medications       diphenhydrAMINE (BENADRYL) 25 mg tablet Comments:   Reason for Stopping:         therapeutic multivitamin-minerals (THERAGRAN-M) tablet Comments:   Reason for Stopping:         ondansetron hcl (ZOFRAN) 8 mg tablet Comments:   Reason for Stopping:               Instructions, risks (black box warning), benefits and side effects (EPS, TD, NMS) were discussed in detail prior to discharge.  Patient denied any adverse medication side effects prior to discharge.       LABS/IMAGING DURING ADMISSION     Results for orders placed or performed during the hospital encounter of 10/22/17   CBC WITH AUTOMATED DIFF   Result Value Ref Range    WBC 9.0 4.6 - 13.2 K/uL    RBC 3.62 (L) 4.20 - 5.30 M/uL    HGB 10.8 (L) 12.0 - 16.0 g/dL    HCT 33.8 (L) 35.0 - 45.0 %    MCV 93.4 74.0 - 97.0 FL    MCH 29.8 24.0 - 34.0 PG    MCHC 32.0 31.0 - 37.0 g/dL    RDW 15.5 (H) 11.6 - 14.5 %    PLATELET 417 135 - 420 K/uL    MPV 9.1 (L) 9.2 - 11.8 FL    NEUTROPHILS 77 (H) 40 - 73 %    LYMPHOCYTES 14 (L) 21 - 52 %    MONOCYTES 9 3 - 10 %    EOSINOPHILS 0 0 - 5 %    BASOPHILS 0 0 - 2 %    ABS. NEUTROPHILS 6.9 1.8 - 8.0 K/UL     ABS. LYMPHOCYTES 1.2 0.9 - 3.6 K/UL    ABS. MONOCYTES 0.8 0.05 - 1.2 K/UL    ABS. EOSINOPHILS 0.0 0.0 - 0.4 K/UL    ABS. BASOPHILS 0.0  0.0 - 0.1 K/UL    DF AUTOMATED     METABOLIC PANEL, COMPREHENSIVE   Result Value Ref Range    Sodium 140 136 - 145 mmol/L    Potassium 4.1 3.5 - 5.5 mmol/L    Chloride 107 100 - 108 mmol/L    CO2 23 21 - 32 mmol/L    Anion gap 10 3.0 - 18 mmol/L    Glucose 65 (L) 74 - 99 mg/dL    BUN 23 (H) 7.0 - 18 MG/DL    Creatinine 0.98 0.6 - 1.3 MG/DL    BUN/Creatinine ratio 23 (H) 12 - 20      GFR est AA >60 >60 ml/min/1.78m    GFR est non-AA 59 (L) >60 ml/min/1.779m   Calcium 9.4 8.5 - 10.1 MG/DL    Bilirubin, total 0.6 0.2 - 1.0 MG/DL    ALT (SGPT) 34 13 - 56 U/L    AST (SGOT) 61 (H) 15 - 37 U/L    Alk. phosphatase 57 45 - 117 U/L    Protein, total 7.4 6.4 - 8.2 g/dL    Albumin 4.2 3.4 - 5.0 g/dL    Globulin 3.2 2.0 - 4.0 g/dL    A-G Ratio 1.3 0.8 - 1.7     SALICYLATE   Result Value Ref Range    Salicylate level <1<7.8L) 2.8 - 20.0 MG/DL   URINALYSIS W/ RFLX MICROSCOPIC   Result Value Ref Range    Color YELLOW      Appearance CLEAR      Specific gravity >1.030 (H) 1.005 - 1.030    pH (UA) 5.0 5.0 - 8.0      Protein TRACE (A) NEG mg/dL    Glucose NEGATIVE  NEG mg/dL    Ketone 80 (A) NEG mg/dL    Bilirubin NEGATIVE  NEG      Blood NEGATIVE  NEG      Urobilinogen 1.0 0.2 - 1.0 EU/dL    Nitrites NEGATIVE  NEG      Leukocyte Esterase SMALL (A) NEG     HCG URINE, QL   Result Value Ref Range    HCG urine, QL NEGATIVE  NEG     ETHYL ALCOHOL   Result Value Ref Range    ALCOHOL(ETHYL),SERUM <3 0 - 3 MG/DL   DRUG SCREEN, URINE   Result Value Ref Range    BENZODIAZEPINES NEGATIVE  NEG      BARBITURATES NEGATIVE  NEG      THC (TH-CANNABINOL) NEGATIVE  NEG      OPIATES NEGATIVE  NEG      PCP(PHENCYCLIDINE) NEGATIVE  NEG      COCAINE NEGATIVE  NEG      AMPHETAMINES NEGATIVE  NEG      METHADONE NEGATIVE  NEG      HDSCOM (NOTE)    ACETAMINOPHEN   Result Value Ref Range     Acetaminophen level <2 (L) 10.0 - 30.0 ug/mL   URINE MICROSCOPIC ONLY   Result Value Ref Range    WBC 11 to 20 0 - 4 /hpf    RBC 0 to 3 0 - 5 /hpf    Epithelial cells 2+ 0 - 5 /lpf    Bacteria FEW (A) NEG /hpf    Mucus 3+ (A) NEG /lpf    Hyaline cast 36 to 50 0 - 2 /lpf   GLUCOSE, POC   Result Value Ref Range    Glucose (POC) 119 (H) 70 - 110 mg/dL  DISCHARGE MENTAL STATUS EVALUATION     Appearance/Hygiene 57 y.o. BLACK OR AFRICAN AMERICAN female  Hygiene: fair   Behavior/Social Relatedness Appropriate   Musculoskeletal Gait/Station: appropriate  Tone (flaccid, cogwheeling, spastic): not assessed  Psychomotor (hyperkinetic, hypokinetic): calm   Involuntary movements (tics, dyskinesias, akathisa, stereotypies): none   Speech                Rate, rhythm, volume, fluency and articulation are appropriate   Mood                euthymic   Affect                               congruent   Thought Process Linear and goal directed   Thought Content and Perceptual Disturbances Denies self-injurious behavior (SIB), suicidal ideation (SI), aggressive behavior or homicidal ideation (HI)  ??  Denies auditory and visual hallucinations   Sensorium and Cognition    Grossly intact   Insight                poor   Judgment limited   ??        SUICIDE RISK ASSESSMENT     [] Admission  [x] Discharge     Key Factors:   Current admission precipitated by suicide attempt?  []  Yes     2    [x]  No     1     Suicide Attempt History  [] Past attempts of high lethality    2 []  Past attempts of low lethality    1 [x]  No previous attempts       0   Suicidal Ideation []  Constant suicidal thoughts      2 []  Intermittent or fleeting suicidal  thoughts  1 [x]  Denies current suicidal thoughts    0   Suicide Plan   []  Has plan with actual OR potential access to planned method    2 []  Has plan without access to planned method      1 [x]  No plan            0   Plan Lethality []  Highly lethal plan (Carbon monoxide, gun, hanging, jumping)     2 []  Moderate lethality of plan          1 []  Low lethality of plan (biting, head banging, superficial scratching, pillow over face)  0   Safety Plan Agreement  []  Unwilling OR unable to agree due to impaired reality testing   2   []  Patient is ambivalent and/or guarded      1 [x]  Reliably agrees        0   Current Morbid Thoughts (reunion fantasies, preoccupations with death) []  Constantly     2     []  Frequently    1 [x]  Rarely    0   Elopement Risk  []  High risk     2 []  Moderate risk    1 [x]   Low risk    0   Symptoms    []  Hopeless  []  Helpless  []  Anhedonia   []  Guilt/shame  []  Anger/rage  []  Anxiety  []  Insomnia   []  Agitation   []  Impulsivity  []  5-6 symptoms present  2 []  3-4 symptoms present    1  [x]  0-2 symptoms present    0     Scoring Key:  10 or higher = Imminent Risk (consider 1:1)  4 - 9 = Moderate Risk (consider q 15 minute observation)Attended alcohol, tobacco, prescription and other drug psychoeducation group.   0 - 3 = Low Risk (consider q 30 minute observation)    Total Score: 1  ------------------------------------------------------------------------------------------------------------------  PLEASE ADDRESS THE FOLLOWING 5 ISSUES     Physician's Subjective Appraisal of Risk (check one):  []  Patient replies not trustworthy: several non-verbal cues.  []  Patient replies questionable: trustworthy: at least 1 non-verbal cue.  [x]  Patient replies appear trustworthy.    Family History of Suicide?   [x]  Yes  []  No    Protective measures (select all that apply):  [x]  Successful past responses to stress  [x]  Spiritual/religious beliefs  [x]  Capacity for reality testing  []  Positive therapeutic relationships  [x]  Social supports/connections  [x]  Positive coping skills  []  Frustration tolerance/optimism  []  Children or pets in the home  [x]  Sense of responsibility to family  [x]  Agrees to treatment plan and follow up    Others (list):     High Risk Diagnoses (select all that apply):  []  Depression/Bipolar Disorder  []  Dual Diagnosis  []  Cardiovascular Disease  [x]  Schizophrenia  []  Chronic Pain  []  Epilepsy  []  Cancer  []  Personality Disorder  []  HIV/AIDS  []  Multiple Sclerosis    Dangerousness Assessment (Suicide, homicide, property destruction...)    Risk Factors reviewed and risk assessed to be:  [] low   [x] low-moderate   [] moderate   [] moderate-high   [] high     Protection factors reviewed and risk assessed to be:  [x] low   [] low-moderate   [] moderate   [] moderate-high   [] high     Response to treatment and risk assessed to be:  [x] low   [] low-moderate   [] moderate   [] moderate-high   [] high     Support reviewed and risk assessed to be:  [x] low   [] low-moderate   [] moderate   [] moderate-high   [] high     Acceptance of Discharge and outpatient treatment reviewed and risk assessed to be:    [x] low   [] low-moderate   [] moderate   [] moderate-high   [] high   Overall risk assessed to be:  [x] low   [] low-moderate   [] moderate   [] moderate-high   [] high     Completion of discharge was greater than 30 minutes. Over 50% of today's discharge was geared towards counseling and coordination of care.          Luther Redo, MD  Psychiatry  [facility]

## 2017-12-04 ENCOUNTER — Encounter

## 2018-01-22 ENCOUNTER — Inpatient Hospital Stay: Admit: 2018-01-22 | Payer: PRIVATE HEALTH INSURANCE | Attending: Hematology & Oncology | Primary: Internal Medicine

## 2018-01-22 DIAGNOSIS — C50412 Malignant neoplasm of upper-outer quadrant of left female breast: Secondary | ICD-10-CM

## 2018-01-22 MED ORDER — IOPAMIDOL 76 % IV SOLN
370 mg iodine /mL (76 %) | Freq: Once | INTRAVENOUS | Status: AC
Start: 2018-01-22 — End: 2018-01-22
  Administered 2018-01-22: 15:00:00 via INTRAVENOUS

## 2018-01-22 MED FILL — ISOVUE-370  76 % INTRAVENOUS SOLUTION: 370 mg iodine /mL (76 %) | INTRAVENOUS | Qty: 80

## 2018-03-21 ENCOUNTER — Inpatient Hospital Stay: Admit: 2018-03-21 | Payer: PRIVATE HEALTH INSURANCE | Primary: Internal Medicine

## 2018-03-21 DIAGNOSIS — C50911 Malignant neoplasm of unspecified site of right female breast: Secondary | ICD-10-CM

## 2018-03-22 ENCOUNTER — Encounter

## 2018-03-23 LAB — CEA: CEA: 1.2 ng/mL (ref 0.0–2.4)

## 2018-03-23 LAB — CA 27.29: CA 27.29: 51 U/mL — ABNORMAL HIGH (ref <38)

## 2018-03-23 LAB — CANCER ANTIGEN (CA) 15-3: Cancer Antigen 15-3: 36 U/mL — ABNORMAL HIGH (ref <32)

## 2018-03-24 ENCOUNTER — Encounter

## 2018-03-26 ENCOUNTER — Inpatient Hospital Stay: Admit: 2018-03-26 | Payer: PRIVATE HEALTH INSURANCE | Attending: Interventional Cardiology | Primary: Internal Medicine

## 2018-03-26 ENCOUNTER — Inpatient Hospital Stay: Admit: 2018-03-26 | Payer: PRIVATE HEALTH INSURANCE | Attending: Specialist | Primary: Internal Medicine

## 2018-03-26 ENCOUNTER — Encounter

## 2018-03-26 DIAGNOSIS — C50911 Malignant neoplasm of unspecified site of right female breast: Secondary | ICD-10-CM

## 2018-03-26 DIAGNOSIS — C7981 Secondary malignant neoplasm of breast: Secondary | ICD-10-CM

## 2018-03-26 MED ORDER — TECHNETIUM TC 99M MEDRONATE IV KIT
Freq: Once | Status: AC
Start: 2018-03-26 — End: 2018-03-26
  Administered 2018-03-26: 14:00:00 via INTRAVENOUS

## 2018-04-02 ENCOUNTER — Ambulatory Visit: Payer: PRIVATE HEALTH INSURANCE | Primary: Internal Medicine

## 2018-04-03 ENCOUNTER — Ambulatory Visit: Payer: PRIVATE HEALTH INSURANCE | Primary: Internal Medicine

## 2018-04-10 ENCOUNTER — Inpatient Hospital Stay: Admit: 2018-04-10 | Payer: PRIVATE HEALTH INSURANCE | Primary: Internal Medicine

## 2018-04-10 DIAGNOSIS — C44501 Unspecified malignant neoplasm of skin of breast: Secondary | ICD-10-CM

## 2018-04-10 NOTE — Progress Notes (Signed)
[place]  Oncology Progress Note  NAME:  Kimberly [name], Kimberly [name]  SEX:   F  DOB:   1960/06/07  DATE: 04/10/2018  REFERRING PHYSICIAN: Erven Colla  MR#    [id number]  LOCATION: RADIATION ONCOLOGY  BILLING#  [id number]    cc:     The patient was seen today for initial consultation regarding breast cancer.  She stated a personal distress score of 4 and declined social service intervention today.      ___________________  Oliver Barre MD  Dictated By: .   zga  D:04/10/2018 11:08:14  T: 04/10/2018  [id number]

## 2018-04-11 NOTE — Consults (Signed)
[place]  Oncology Consultation  NAME:  Kimberly [name], Kimberly [name]  SEX:   F  DOB:   [birth year]  DATE: 04/10/2018  REFERRING PHYSICIAN: Erven Colla  MR#    [id number]  LOCATION: RADIATION ONCOLOGY  BILLING#  [id number]    cc: Heath Lark MD; Riki Altes MD; Torrie Mayers MD    REFERRING PHYSICIANS:  Heath Lark, MD   Torrie Mayers, MD   Riki Altes, MD    DIAGNOSIS:  Infiltrating carcinoma of the left breast, status post neoadjuvant chemotherapy and bilateral nipple-sparing mastectomies and left chest wall and lymphatic radiation, now with right mastectomy skin disease.    HISTORY OF PRESENT ILLNESS:  The patient is a 57 year old woman first seen by me on 02/29/2016, after biopsy of a palpable mass in the left breast.  This was performed in [place], [place] and at least 2 masses in the left breast were positive for infiltrating ductal carcinoma as well as left axilla.  She underwent neoadjuvant chemotherapy that was completed by Dr. Ples Specter here in [place].  She underwent left nipple-sparing mastectomy on 01/27/2016, which showed at least two 2.5 cm masses in the lower outer quadrant and lower inner quadrant with separate foci and intralymphatic tumor emboli.  Seven of 15 left axillary lymph nodes were positive for malignancy.  She underwent right mastectomy with nipple-sparing mastectomy and sentinel node sampling that showed benign tissue only.  She underwent postoperative Taxol chemotherapy and from 05/24/2016 through 07/08/2016, received 5000 cGy in 25 fractions to the reconstructed left breast as well as lymphatics in the left axilla and left supraclavicular area.  Surgical scars received an additional 1000 cGy for a total of 6000 cGy to the area of initial disease in the left side.  She has continued on systemic treatment by Dr. Ples Specter with capecitabine.  Plans were for further reconstruction earlier this year.  She was recently seen by Dr. Stevie Kern,  who noted swelling and erythematous changes in the lower inner quadrant of the contralateral right side.  Biopsy on 03/09/2018 was positive for invasive carcinoma involving the dermis, ER negative, PR positive (20%), HER2 negative.  She is receiving further chemotherapy with Taxotere, further Adriamycin and Cytoxan, and is referred for consideration of possible future right breast radiation therapy.    PAST MEDICAL HISTORY:  Previous bilateral nipple-sparing mastectomies.  Previous chemotherapy for left breast cancer and left breast and lymphatic radiation as above.  Also, history of asthma, hypercholesterolemia, hernia repair, hysterectomy.    CURRENT MEDICATIONS:  Avastin, Zoloft, Latuda, Ambien.    ALLERGIES:  ATIVAN.    SOCIAL HISTORY:  Negative for cigarette or alcohol use.    FAMILY HISTORY:  Positive for breast cancer in maternal aunt.    REVIEW OF SYSTEMS:  CONSTITUTIONAL:  The patient admits to mild fatigue.  EYES:  Denies visual problem.  ENT:  Denies hearing or swallowing problem.  RESPIRATORY:  Denies respiratory problem.  CARDIOVASCULAR:  Denies cardiac problem.  GASTROINTESTINAL:  Denies gastrointestinal problem.  GENITOURINARY:  Denies genitourinary problem.  MUSCULOSKELETAL:  Complaints of mild arthritic pains as well as tightness in the right chest wall.  NEUROLOGIC:  Denies neurologic problem.  PSYCHIATRIC:  Denies psychiatric problem.    PAIN AND PAIN MANAGEMENT:  The patient takes no pain medication at this time.    QUALITY OF LIFE EVALUATION:  The patient's current quality of life appears very good.  Karnofsky status 100%.    PHYSICAL EXAMINATION:  GENERAL:  The patient is a well-developed, well-nourished, adult female  in no apparent distress.  VITAL SIGNS:  Afebrile, blood pressure 132/48.  HEENT:  No evidence of scleral icterus.    NECK:  No adenopathy in the neck or supraclavicular regions bilaterally.  CHEST:  Clear to auscultation and percussion.  No spinal tenderness or rib  tenderness to percussion.  HEART:  Regular rate and rhythm.  MediPort in the left infraclavicular area as before.  BREASTS:  Left breast is status post skin-sparing mastectomy.  Well-healed surgical scars are noted.  No residual radiation reaction.  No visible or palpable evidence of recurrent disease in the reconstructed breast or lymphatics.  No skin changes.  Right breast also is status post skin-sparing mastectomy.  Lower inner quadrant shows significant skin thickening with some erythematous changes consistent with area of dermal cancer biopsy.  The patient states the area is less tender since starting chemotherapy.  Right axilla is without palpable lymphadenopathy.  ABDOMEN:  Benign.  EXTREMITIES:  No clubbing, cyanosis or edema.  NEUROLOGICAL:  Grossly intact.  The patient is awake, alert and oriented times 3 with no focal motor or sensory deficit.    LABORATORY STUDIES:  Biopsy of right breast skin 03/09/2018, as above, showing invasive carcinoma involving the dermis; ER negative, PR positive, HER2 negative.    ICD-10 CODE:  C50.919.    STAGE OF TUMOR:  Right side T4 clinical N0.    ASSESSMENT:  The patient is a 57 year old woman who is approximately 18 months status post completion of radiation therapy to the reconstructed left breast and lymphatics following neoadjuvant chemotherapy, bilateral nipple-sparing mastectomy and further chemotherapy for locally advanced left breast cancer.  She now has biopsy proven evidence of disease in the skin of the right breast.  As discussed with Dr. Ples Specter, as this tumor on the right side is PR positive and left tumor was triple negative, this may represent a second primary.  As she is having a good clinical response to chemotherapy, this will be continued with Taxotere, limited Adriamycin and Cytoxan.  If good response is noted, she could be a candidate for surgical resection of the right skin.  The right skin was not treated with previous  left breast and lymphatic radiation and she may be a candidate for right-sided radiation therapy after chemotherapy and surgery.  The risks and benefits of possible further radiation were discussed in detail with the patient.  She appears to fully understand her current disease status and treatment options and appears very pleased with the way her treatment is progressing.  I will await further chemotherapy and surgical resection and plan treatment accordingly.  In the meantime, I have given the patient my personal cell phone number to call should she have any questions or concerns.    I thank the referring physicians for allowing me to participate in the care of this very pleasant woman.    Total time spent with the patient was approximately 45 minutes.      ___________________  Oliver Barre MD  Dictated By: .   MN  D:04/10/2018 79:89:21  T: 04/11/2018  [id number]

## 2018-07-25 ENCOUNTER — Inpatient Hospital Stay: Admit: 2018-07-25 | Primary: Internal Medicine

## 2018-07-25 LAB — SENTARA SPECIMEN COLLN.

## 2018-09-06 ENCOUNTER — Encounter

## 2018-09-14 ENCOUNTER — Inpatient Hospital Stay: Admit: 2018-09-14 | Payer: MEDICAID | Attending: Specialist | Primary: Internal Medicine

## 2018-09-14 ENCOUNTER — Encounter

## 2018-09-14 DIAGNOSIS — C50911 Malignant neoplasm of unspecified site of right female breast: Secondary | ICD-10-CM

## 2018-09-14 MED ORDER — IOPAMIDOL 61 % IV SOLN
61 % | Freq: Once | INTRAVENOUS | Status: AC
Start: 2018-09-14 — End: 2018-09-14
  Administered 2018-09-14: 14:00:00 via INTRAVENOUS

## 2018-09-14 MED FILL — ISOVUE-300  61 % INTRAVENOUS SOLUTION: 300 mg iodine /mL (61 %) | INTRAVENOUS | Qty: 80

## 2018-10-22 ENCOUNTER — Inpatient Hospital Stay: Admit: 2018-11-13 | Payer: MEDICAID | Primary: Internal Medicine

## 2018-10-22 DIAGNOSIS — Z510 Encounter for antineoplastic radiation therapy: Secondary | ICD-10-CM

## 2018-10-22 NOTE — Progress Notes (Signed)
[place]  Radiation Oncology Progress Note      Patient: Kimberly [name] Age: 58 y.o. Sex: female    Date of Birth: May 23, 1960 PCP: Anne Shutter, MD Location: Radiation Oncology   MRN: [id number]  CSN: [id number]  Date: October 22, 2018     Radiation oncology regroup note    Torrie Mayers, MD; Ralene Cork, MD; Marlowe Aschoff, MD      Patient was seen back in the radiation therapy department.  She was initially status post radiation therapy to the left breast and lymphatics following surgery for locally advanced breast cancer.  She completed 5000 cGy in 25 fractions covering the reconstructed left breast and lymphatics with a 1000 cGy boost to the surgical scar.  Continued on systemic chemotherapy in November 2019 developed erythema in the contralateral right breast.  Biopsy 03/09/2018 was positive for triple negative breast cancer involving the dermis.  She received further chemotherapy followed by bilateral mastectomy showing extensive bilateral dermal involvement.  Further excision of the skin with ultimate excision 09/26/2018 showing multifocal dermal disease throughout the entire right chest wall as well as some degree on the left.    On examination today all surgical scars appear well-healed except for 1 small 2 mm area and inner corner of 1 of the left side scars.  No evidence of dermal recurrence at this time.  Significant fibrosis palpated.  She does still have good range of motion of both shoulders.  I will now plan superficial radiation therapy covering right chest wall and part of left chest wall to cover all areas of recently excised dermal tumor.  Most recent tumor is showing metaplastic features in addition to triple negative status so risk of recurrence is very high.  Radiation fields will be as generous as possible to cover suspected microscopic dermal spread.  Appropriate consent forms were signed and she will return later this week  for CT simulation with first treatment soon thereafter.    Oliver Barre, MD  October 22, 2018  2:27 PM

## 2018-11-07 ENCOUNTER — Inpatient Hospital Stay: Admit: 2018-11-07 | Payer: MEDICAID | Primary: Internal Medicine

## 2018-11-07 DIAGNOSIS — Z510 Encounter for antineoplastic radiation therapy: Secondary | ICD-10-CM

## 2018-11-07 NOTE — Progress Notes (Signed)
[place]  Radiation Oncology Progress Note      Patient: Kimberly [name] Age: 58 y.o. Sex: female    Date of Birth: [birth year] PCP: Anne Shutter, MD Location: Radiation Oncology   MRN: [id number]  CSN: [id number]  Date: November 14, 2018     Ms. Hines is now completed 9 of 25 planned electron treatments to the right and medial left chest wall.  This totals 1800 cGy.      Surgical scars remain well-healed.    Having darkening in the treatment field but no desquamation    Films were checked for accuracy.  Treatment to continue as planned.      Maretta Los, MD  November 14, 2018  2:20 PM

## 2018-11-07 NOTE — Progress Notes (Signed)
[place]  Radiation Oncology Progress Note      Patient: LAVELLA MYREN Age: 58 y.o. Sex: female    Date of Birth: 01/08/1961 PCP: Anne Shutter, MD Location: Radiation Oncology   MRN: [id number]  CSN: [id number]  Date: December 05, 2018     Ms. Cazarez was seen today on day 24 of 25 planned treatments to the left and right chest walls with electrons.  This totals 4800 cGy.  On examination skin within the x-ray portals is erythematous but intact.  No desquamation.  No visible or palpable evidence of recurrent disease.  Right chest wall field (untreated prior to this course) will be continued for additional 1000 cGy.  Left chest wall with overlap of a previously treated area will stop at 5000.      Oliver Barre, MD  December 05, 2018  2:25 PM

## 2018-11-07 NOTE — Progress Notes (Signed)
[place]  Radiation Oncology Progress Note      Patient: Kimberly [name] Age: 58 y.o. Sex: female    Date of Birth: [birth year] PCP: Anne Shutter, MD Location: Radiation Oncology   MRN: [id number]  CSN: [id number]  Date: November 21, 2018     [name] was seen today on day 5 of 30 planned electron treatments to the right and left chest walls.  This totals 1000 cGy.  On examination there is trace hyperpigmentation but no desquamation or dryness.  No palpable or visible evidence of recurrent disease.  Ports were checked for accuracy and treatment to continue as planned.      Oliver Barre, MD  November 21, 2018  2:11 PM

## 2018-11-07 NOTE — Progress Notes (Signed)
[place]  Radiation Oncology Progress Note      Patient: Kimberly [name] Age: 58 y.o. Sex: female    Date of Birth: Jan 01, 1961 PCP: Anne Shutter, MD Location: Radiation Oncology   MRN: [id number]  CSN: [id number]  Date: November 07, 2018     [name] is now completed 5 of 30 planned electron treatments to the right and medial left chest wall.  This totals 1000 cGy.  Surgical scars remain well-healed.  No evidence of tumor recurrence or skin reaction.  Treatment to continue as planned.      Oliver Barre, MD  November 07, 2018  2:31 PM

## 2018-11-07 NOTE — Progress Notes (Signed)
[place]  Radiation Oncology Progress Note      Patient: Kimberly [name] Age: 58 y.o. Sex: female    Date of Birth: 1960/05/26 PCP: Anne Shutter, MD Location: Radiation Oncology   MRN: [id number]  CSN: [id number]  Date: November 28, 2018     Ms. Shugars was seen today on day 19 of 25 planned treatments to the right chest wall and medial left chest wall.  This totals 3800 centigrade.  On examination all surgical scars remain well-healed.  Skin within the currently treated radiation portal shows hyperpigmentation but no desquamation.  No palpable evidence of recurrent disease.  Treatment to continue as planned.      Oliver Barre, MD  November 28, 2018  2:10 PM

## 2018-12-10 ENCOUNTER — Inpatient Hospital Stay: Admit: 2018-12-10 | Payer: MEDICAID | Primary: Internal Medicine

## 2018-12-10 DIAGNOSIS — Z510 Encounter for antineoplastic radiation therapy: Secondary | ICD-10-CM

## 2018-12-10 NOTE — Progress Notes (Signed)
[place]  Radiation Oncology Completion Report    Patient: Kimberly [name] Age: 58 y.o. Sex: female    Date of Birth: 1961/04/14 PCP: Anne Shutter, MD Location: Radiation Oncology   MRN: [id number]   Date: December 13, 2018      Referring Physicians: Torrie Mayers, MD; Erven Colla, MD ; Riki Altes, MD    Diagnosis:   Infiltrating carcinoma of the left and right breast recurrent status post bilateral mastectomies and resection (and status post left breast radiation in the past)  ICD: C50.912     RADIATION HISTORY:   Treatment started: 10/31/2018  Treatment completed: 12/13/2018    Source of radiation:   External beam 9 MV electrons  Field description:   Treatment fields covered the entire right chest wall including areas of tumor excision and dermal recurrence.  Separate field cover the medial left chest wall surgical area  Technique:    Custom shaped superficial electron portals with moving borders  Dose:   6000 cGy in 30 fractions to all areas    Discharge Summary:   The patient is a  58 y.o. female with biopsy-proven aggressive recurrent breast cancer with multiple skin recurrent lesions.  Although she had received previous left breast radiation and left lymphatic radiation in the past majority of irradiated tissue had been removed and current fields treated roughly to midline of the left chest wall in addition to the entire right chest wall.  She tolerated the treatment very well with the expected skin irritation and areas of dry desquamation overlying the sternum.  She will continue be followed by her referring physicians and with her permission of like to see [name] back in follow-up in 1 month.  I thank the referring physicians for allowing me to again participate in the care of this very pleasant woman.  Oliver Barre, MD  December 13, 2018

## 2018-12-10 NOTE — Progress Notes (Signed)
[place]  Radiation Oncology Progress Note      Patient: Kimberly [name] Age: 58 y.o. Sex: female    Date of Birth: December 17, 1960 PCP: Anne Shutter, MD Location: Radiation Oncology   MRN: [id number]  CSN: [id number]  Date: June 05, 2019     [name] was seen today in routine clinic on day 7 of 10 planned treatments to the right shoulder and abdominal wall.  This totals 2100 cGy to both areas.  On examination skin within both portal shows no sign of radiation reaction.  Multiple firm dermal nodules are slowly shrinking and softening.  Patient states that she had some wheezing last night.  On examination lungs show clear breath sounds bilaterally without rhonchi or wheezes.  Port films were checked for accuracy and treatment will continue as planned.      Oliver Barre, MD  June 05, 2019  11:22 AM

## 2018-12-10 NOTE — Progress Notes (Signed)
[facility]  Radiation Oncology Progress Note      Patient: DAVIANA HAYMAKER Age: 58 y.o. Sex: female    Date of Birth: 1961/02/10 PCP: Anne Shutter, MD Location: Radiation Oncology   MRN: [id number]  CSN: [id number]  Date: May 29, 2019     Ms. Khurana has now undergone 2 of 10 planned treatments to the right axilla and abdominal wall.  This totals 600 cGy to both sites.  Port films were checked for accuracy and treatment to continue as planned.      Oliver Barre, MD  May 29, 2019  11:24 AM

## 2018-12-10 NOTE — Progress Notes (Signed)
[place]  Radiation Oncology Progress Note      Patient: Kimberly [name] Age: 58 y.o. Sex: female    Date of Birth: July 09, 1960 PCP: Anne Shutter, MD Location: Radiation Oncology   MRN: [id number]  CSN: [id number]  Date: December 12, 2018     Ms. Mask was seen today in routine clinic on day 29 of 30 planned treatments to the right chest wall and medial left chest wall.  This totals 5800 cGy.  On examination skin within the x-ray portals is hyperpigmented with an area of dry desquamation over the sternum.  No moist desquamation.  No visible or palpable evidence of tumor recurrence.  Treatment to be completed tomorrow as planned.      Oliver Barre, MD  December 12, 2018  2:04 PM

## 2019-05-16 DIAGNOSIS — Z510 Encounter for antineoplastic radiation therapy: Secondary | ICD-10-CM

## 2019-05-16 NOTE — Progress Notes (Signed)
[place]  Radiation Oncology Progress Note      Patient: Kimberly [name] Age: 59 y.o. Sex: female    Date of Birth: [birth year] PCP: Anne Shutter, MD Location: Radiation Oncology   MRN: [id number]  CSN: [id number]  Date: May 16, 2019     Torrie Mayers, MD; Heath Lark, MD; Riki Altes, MD    Kimberly [name] was seen back in the radiation therapy department today.  On July 08, 2016 she completed 5000 cGy in 25 fractions to the entire left chest wall as well as lymphatics in the left axilla and left supraclavicular region followed by a 1000 cGy central chest wall boost for total of 6000 cGy to the area of greatest dermal disease.  More recently on 12/13/2018 she completed 6000 cGy in 30 fractions to the entire right chest wall covering areas of dermal disease.    She has recently noticed occurrence of painful, pruritic erythematous lesions just outside the radiation portal in the right infraclavicular area and across the right and left abdominal wall.  These were recently biopsied positive by Dr. Owens Shark as infiltrating breast carcinoma, triple negative.  Small, nonpainful areas are noted in the right anterior chest wall within the previous 5000 cGy portal but these are not bothersome to the patient.    I will now plan further superficial radiation therapy cover the areas of painful dermal metastases outside the previous portals in the right infraclavicular area, right axilla, and right and left abdominal walls.  Appropriate consent forms were signed and she will return in a few days for three-dimensional simulation with first plan treatment soon thereafter.    I thank the referring physicians for allowing me to continue to participate in the care of this very pleasant woman.          Oliver Barre, MD  May 16, 2019  2:32 PM

## 2019-05-17 ENCOUNTER — Inpatient Hospital Stay: Payer: MEDICAID | Primary: Internal Medicine

## 2019-06-03 ENCOUNTER — Encounter

## 2019-06-10 ENCOUNTER — Inpatient Hospital Stay: Payer: MEDICAID | Attending: Hematology & Oncology | Primary: Internal Medicine

## 2019-06-10 ENCOUNTER — Inpatient Hospital Stay: Admit: 2019-06-10 | Payer: MEDICAID | Attending: Hematology & Oncology | Primary: Internal Medicine

## 2019-06-10 ENCOUNTER — Encounter

## 2019-06-10 ENCOUNTER — Inpatient Hospital Stay: Admit: 2019-06-10 | Payer: MEDICAID | Primary: Internal Medicine

## 2019-06-10 DIAGNOSIS — Z510 Encounter for antineoplastic radiation therapy: Secondary | ICD-10-CM

## 2019-06-10 DIAGNOSIS — C50311 Malignant neoplasm of lower-inner quadrant of right female breast: Secondary | ICD-10-CM

## 2019-06-10 LAB — CREATININE, POC: Creatinine: 0.8 mg/dl (ref 0.6–1.3)

## 2019-06-10 MED ORDER — BARIUM SULFATE 2 % ORAL SUSP
2.1 % (w/v), 2.0 % (w/w) | Freq: Once | ORAL | Status: AC
Start: 2019-06-10 — End: 2019-06-10
  Administered 2019-06-10: 14:00:00 via ORAL

## 2019-06-10 MED ORDER — IOPAMIDOL 61 % IV SOLN
61 % | Freq: Once | INTRAVENOUS | Status: AC
Start: 2019-06-10 — End: 2019-06-10
  Administered 2019-06-10: 14:00:00 via INTRAVENOUS

## 2019-06-10 MED FILL — ISOVUE-300  61 % INTRAVENOUS SOLUTION: 300 mg iodine /mL (61 %) | INTRAVENOUS | Qty: 100

## 2019-06-10 MED FILL — BARIUM SULFATE 2 % ORAL SUSP: 2.1 % (w/v), 2.0 % (w/w) | ORAL | Qty: 900

## 2019-06-10 NOTE — Progress Notes (Signed)
[place]  Radiation Oncology Progress Note      Patient: Kimberly [name] Age: 59 y.o. Sex: female    Date of Birth: August 31, 1960 PCP: Anne Shutter, MD Location: Radiation Oncology   MRN: [id number]  CSN: [id number]  Date: June 10, 2019     [name] was seen today prior to treatment #9 of 10 planned to the right infraclavicular area and abdominal wall for dermal metastatic breast cancer.  She is markedly more short of breath.  Her vital signs are within normal limits with blood oxygenation 97.8% on room air.  Blood pressure is increased to 137/104.  She was sitting comfortably, in no distress.  Patient states that she was short of breath all weekend.  On examination she does have dullness to percussion at the left base.  CT scan performed this morning, as per Dr. Ples Specter, shows bilateral pleural effusions left markedly larger than right.    Patient could not lay flat for treatment today and left the department.  She was not in any distress.  I will contact Dr. Ples Specter to planned thoracentesis.      Oliver Barre, MD  June 10, 2019  10:31 AM

## 2019-06-10 NOTE — Progress Notes (Signed)
[place]    Radiation Oncology Follow-Up Note      Patient: Kimberly [name] Age: 59 y.o. Sex: female    Date of Birth: 1960/06/09 PCP: Anne Shutter, MD Location: Radiation Oncology   MRN: [id number]  CSN: [id number]  Date: July 19, 2019     Torrie Mayers, MD; Heath Lark, MD; Riki Altes, MD    Infiltrating carcinoma of the left breast metastatic and recurrent to multiple areas in the skin  Treatment:   Kimberly [name] has received multiple courses of radiation therapy to the left breast left chest wall lymphatics right chest wall and right shoulder.  Most recently on 06/17/2018 when she completed 3000 cGy in 10 fractions retreating the area of recurrent dermal disease in the right infraclavicular area shoulder and right axilla.  Separate fields cover the area of painful dermal metastases in the upper abdomen.    Status since last visit:   Kimberly [name] is a 59 y.o. female was last seen in radiation therapy follow-up on her last day of radiation on 06/18/2019.  At that time she had undergone drainage of malignant left pleural effusion.  He has continued to do well since that time.  She has had chest tube replaced with Pleurx catheter.  She is currently on supplemental oxygen 24 hours a day.  Plans are underway for further chemotherapy next week.  Chest x-ray was performed yesterday that showed persistent but smaller left pleural effusion.  Despite all this, she remains in excellent spirits.    Physical examination: Patient is a well-developed well-nourished adult female in no apparent distress.  Nasal cannula oxygen in place.  HEENT: No scleral icterus.  No adenopathy in the neck or supraclavicular regions bilaterally.  Previously palpable dermal disease in the right and left supraclavicular areas has decreased significantly.  Chest: Decreased breath sounds on the left.  Pleurx catheter in place bandaged and covered.  Left anterior chest wall status post mastectomy  and multiple course of palliative radiation.  Small dermal lesions noted in the superior edge of the chest wall field but these are smaller and no longer painful to the patient.  Right chest wall also status post mastectomy.  Well-healed surgical scars noted.  Previously noted painful dermal lesions in the infraclavicular area and axilla have decreased substantially.  1 or 2 lesions still remain in the skin of the axilla but much smaller than before and not painful to the patient.  Hyperpigmentation remains in the chest walls.  Heart: Regular rate and rhythm  Abdomen: Hyperpigmentation remains in the area of upper abdominal superficial radiation.  Most of the previously palpable nodules have regressed completely.  Some nodules remain in the right upper quadrant but these are smaller and are no longer painful to her.  No other abnormalities palpable.    Laboratory studies: Chest x-ray performed yesterday showing Pleurx catheter with residual left pleural effusion.    Assessment: Kimberly [name] continues to do remarkably well now approximately 3 years status post neoadjuvant chemotherapy and bilateral nipple sparing mastectomies for left locally advanced breast cancer.  Her previously noted painful dermal lesions have regressed well and are no longer painful to her.  She continues with Pleurx catheter in the left due to persistent effusion and currently is more comfortable on nasal cannula oxygen.  Plans are underway for further chemotherapy.    I have no plans for further radiation at this time but should her condition change I will be more than happy  to reevaluate Kimberly [name] at any time.      I thank the referring physicians for allowing me to continue to participate in the care of [name] very pleasant woman.      Oliver Barre, MD  July 19, 2019  1:49 PM

## 2019-06-10 NOTE — Progress Notes (Signed)
[place]  Radiation Oncology Completion Report    Patient: Kimberly [name] Age: 59 y.o. Sex: female    Date of Birth: Mar 31, 1961 PCP: Anne Shutter, MD Location: Radiation Oncology   MRN: [id number]   Date: June 18, 2019      Referring Physicians: Torrie Mayers, MD; Heath Lark, MD; Riki Altes, MD    Diagnosis:   Infiltrating carcinoma of the left breast metastatic and recurrent to multiple areas of skin  ICD: C50.912     RADIATION HISTORY:   Treatment started: 05/28/2019  Treatment completed: 06/18/2019    Source of radiation:   External beam 6 MV photons  Field description:   Treatment fields covered the areas of painful recurrent dermal disease in the right infraclavicular area, shoulder, and axilla.  Separate field cover the area of painful dermal metastases on the upper abdomen  Technique:    Multifield multi beam 3-dimensional portals covering the right shoulder.  Multifield arc IMRT fields covering the abdomen  Dose:   3000 cGy in 10 fractions to all areas    Discharge Summary:   The patient is a  59 y.o. female with aggressive recurrent and metastatic breast cancer.  Although the majority of the right chest wall was treated previously in 2018 the presence of painful dermal metastases were mainly outside the previously treated area in the right shoulder and infraclavicular area.  Multiple painful dermal metastases across the entire abdominal wall were untreated previously.  She tolerated her palliative radiation therapy to both areas exceptionally well.  She did require a break in treatment for thoracentesis of a large left pleural effusion but completed treatment without incident.  She will continue be followed by Dr. Ples Specter and with his permission of like to see Kimberly [name] back in follow-up in 1 month.    I thank the referring physicians for allowing me to again participate in the care of this very pleasant woman.          Oliver Barre, MD   June 18, 2019

## 2019-06-11 ENCOUNTER — Inpatient Hospital Stay: Admit: 2019-06-11 | Discharge: 2019-06-11 | Payer: MEDICAID | Primary: Internal Medicine

## 2019-06-11 DIAGNOSIS — J90 Pleural effusion, not elsewhere classified: Secondary | ICD-10-CM

## 2019-06-13 LAB — SARS-COV-2, NAA: SARS-CoV-2, NAA: NOT DETECTED

## 2019-06-14 ENCOUNTER — Inpatient Hospital Stay: Admit: 2019-06-14 | Payer: MEDICAID | Attending: Hematology & Oncology | Primary: Internal Medicine

## 2019-06-14 ENCOUNTER — Ambulatory Visit: Admit: 2019-06-14 | Payer: MEDICAID | Primary: Internal Medicine

## 2019-06-14 DIAGNOSIS — C782 Secondary malignant neoplasm of pleura: Secondary | ICD-10-CM

## 2019-06-14 LAB — POC PT/INR
INR: 1 (ref 0.0–1.1)
Prothrombin time: 11.9 seconds (ref 0.0–14.0)

## 2019-06-14 MED ORDER — LIDOCAINE (PF) 10 MG/ML (1 %) IJ SOLN
10 mg/mL (1 %) | Freq: Once | INTRAMUSCULAR | Status: AC
Start: 2019-06-14 — End: 2019-06-14
  Administered 2019-06-14: 14:00:00 via SUBCUTANEOUS

## 2019-06-14 NOTE — Progress Notes (Signed)
DISCHARGE SUMMARY from Nurse  ?  ?  PATIENT INSTRUCTIONS:  ?  Notify you Physician if you experience:  Increased Shortness of Breath   Dizziness   Fainting   Black Stools   Vomiting   Coughing White, Frothy or Bloody Sputum   Dry cough that will not go away   Increased swelling of arms, legs, ankles or abdomen   Develop a rash   Difficulty breathing while laying down   Urine problems: pain, burning, urgency or difficulty   Temperature greater than 100 degrees F, shaking, chills for more than 24 hours   Increased skin bruising   Sore Mouth, throat or gums   Increased cough, fatigue   Clicking/popping sound in a joint   Increased limb shortening or turning outward   ?  ?  Call you Doctor right away if you have new symptoms such as:  Cough that is worse at night and when you are lying down   Swelling in your legs, ankles, feet, abdomen and/or veins in the neck   ?  Lifestyle Tips:  Appointment after discharge and follow up phone call:  After being discharged, if your appointment does not work for you, please feel free to contact the physician's office to change the appointment.  We want to make sure you are doing well after you have left the hospital. You will receive a phone call on behalf of [facility] to follow up with you within 24-72 hours of your discharge from the hospital.  This phone call will help [name] provide you with the best possible care. Please take the time to answer all questions you are asked so we can help you stay safe in your home before your next doctor's appointment.  ?  Medications:  Take your medicines exactly as instructed.  Ask your doctor or nurse if you have questions about your medicines.  Tell your doctor right away if you have problems with your medicines.  Activity:  Ask your doctor how active you should be. Remember to take rest breaks.  Do not cross your legs when sitting.  Elevate your legs when you can.  Diet:  Follow any special diet orders from your doctor.   Ask your doctor how much salt (sodium) you should have each day.  Ask if you need to limit the amount or types of fluids you drink each day.  Weight Monitoring:  If you are overweight, ask about a weight loss plan that is right for you.  Weight gain may mean that fluids are building up in your body.  Weigh yourself every day at about the same time and write it down.  Do Not Smoke:  If you smoke, quit. Smoking is bad for your health.  Avoid second hand smoke.  Call the [employer] for help at 978-266-3590 (1-800-Quit - Now)  Other Tips:  Avoid people with the flu or pneumonia  Keep all of your doctor appointments  Carry a list of your medications, allergies and the shots you have had  ?  ?  ?  These are general instructions for a healthy lifestyle:  ?  No smoking/ No tobacco products/ Avoid exposure to second hand smoke  ?  Surgeon General's Warning: Quitting smoking now greatly reduces serious risk to your health.  ?  Obesity, smoking, and sedentary lifestyle greatly increases your risk for illness  ?  A healthy diet, regular physical exercise & weight monitoring are important for maintaining a healthy lifestyle  ?  You may be retaining fluid if you have a history of heart failure or if you experience any of the following symptoms: Weight gain of 3 pounds or more overnight or 5 pounds in a week, increased swelling in our hands or feet or shortness of breath while lying flat in bed. Please call your doctor as soon as you notice any of these symptoms; do not wait until your next office visit.  ?  Recognize signs and symptoms of STROKE:  ?  F-face looks uneven  ?  A-arms unable to move or move unevenly  ?  S-speech slurred or non-existent  ?  T-time-call 911 as soon as signs and symptoms begin-DO NOT go   Back to bed or wait to see if you get better-TIME IS BRAIN.  ?  Warning Signs of HEART ATTACK   ?  Call 911 if you have these symptoms:   Chest discomfort. Most heart attacks involve discomfort in the center of the chest that lasts more than a few minutes, or that goes away and comes back. It can feel like uncomfortable pressure, squeezing, fullness, or pain.   Discomfort in other areas of the upper body. Symptoms can include pain or discomfort in one or both arms, the back, neck, jaw, or stomach.   Shortness of breath with or without chest discomfort.   Other signs may include breaking out in a cold sweat, nausea, or lightheadedness.   Don't wait more than five minutes to call 911 ??? MINUTES MATTER! Fast action can save your life. Calling 911 is almost always the fastest way to get lifesaving treatment. Emergency Medical Services staff can begin treatment when they arrive ??? up to an hour sooner than if someone gets to the hospital by car.   Myself and/or my family have received education about my diagnosis throughout my hospital stay. During my hospital stay, I and/or my family/caregiver were included in planning my care upon discharge. My needs were taken into consideration and I was included in my discharge planning. I had an opportunity to ask questions.   The discharge information has been reviewed with the patient. The patient verbalized understanding.  ?  ?  Discharge medications reviewed with the patient and appropriate educational materials and side effects teaching were provided. Pt left A&Ox4, VSS with all belongings. Pt left via wheelchair accompanied by cath lab holding staff and family.

## 2019-06-14 NOTE — Procedures (Signed)
INTERVENTIONAL RADIOLOGY NOTE:    Procedure:  Ultrasound guided thoracentesis     Pre-operative Diagnosis: Pleural effusion    Post-operative Diagnosis: same    Procedure Details:   Informed consent obtained. Standard aseptic technique. Ultrasound guidance. left posterior intercostal approach. 1% lidocaine for local anesthesia.     5 [place] Yueh sheathed needle connected to vacuum bottle. 96mL fluid is  removed.       Labs:  Samples submitted for lab evaluation        Complications:  No immediate.                 Condition: Stable.    Impression:  Successful thoracentesis.    Plan:  Post procedure chest xrays pending.    Kern Alberta, MD  June 14, 2019  10:03 AM

## 2019-06-14 NOTE — H&P (Signed)
BRIEF HISTORY AND PHYSICAL    Clinical History:   Left pleural effusion    HPI:  Kimberly [name] with pleural effusion for thoracintesis  Allergies:      Allergies   Allergen Reactions   ??? Ativan [Lorazepam] Unknown (comments)     Pt unclear.       Current Meds:       No current facility-administered medications on file prior to encounter.      Current Outpatient Medications on File Prior to Encounter   Medication Sig Dispense Refill   ??? lurasidone (Latuda) 80 mg tab tablet Take 80 mg by mouth.     ??? sertraline (ZOLOFT) 100 mg tablet Take 100 mg by mouth daily.     ??? diphenhydrAMINE (BenadryL) 25 mg capsule Take 25 mg by mouth every six (6) hours as needed.     ??? atorvastatin (LIPITOR) 20 mg tablet Take 20 mg by mouth daily.     ??? zolpidem (AMBIEN) 10 mg tablet Take 10 mg by mouth nightly as needed for Sleep.     ??? multivitamin (ONE A DAY) tablet Take 1 Tab by mouth daily.     ??? ferrous sulfate (Iron) 325 mg (65 mg iron) tablet Take 325 mg by mouth Daily (before breakfast).     ??? haloperidol (HALDOL) 5 mg tablet Take 1 Tab by mouth nightly. Indications: psychotic disorder 30 Tab 0           @mednamec @    Comorbid Conditions:    Past Medical History:   Diagnosis Date   ??? Cancer ([place])    ??? Psychiatric disorder     schzophrenia          Past Surgical History:   Procedure Laterality Date   ??? BREAST SURGERY PROCEDURE UNLISTED      bilateral   ??? HX GYN      tubal ligation   ??? HX HERNIA REPAIR N/A     umbilical   ??? IR OCCL TXCATH ORGAN W SI N/A        Data:    Visit Vitals  BP (!) 150/88   Pulse 96   Temp 98.5 ??F (36.9 ??C)   Resp 18   SpO2 98%   :    Lab Results   Component Value Date    PTP 11.9 06/14/2019    INR 1.0 06/14/2019       CBC w/Diff    Lab Results   Component Value Date/Time    WBC 9.0 10/22/2017 02:47 PM    RBC 3.62 (L) 10/22/2017 02:47 PM    HGB 10.8 (L) 10/22/2017 02:47 PM    HCT 33.8 (L) 10/22/2017 02:47 PM    MCV 93.4 10/22/2017 02:47 PM    MCH 29.8 10/22/2017 02:47 PM    MCHC 32.0 10/22/2017 02:47 PM     RDW 15.5 (H) 10/22/2017 02:47 PM    PLT 417 10/22/2017 02:47 PM        Lab Results   Component Value Date/Time    GRANS 77 (H) 10/22/2017 02:47 PM    LYMPH 14 (L) 10/22/2017 02:47 PM    MONOS 9 10/22/2017 02:47 PM    EOS 0 10/22/2017 02:47 PM    BASOS 0 10/22/2017 02:47 PM      Basic Metabolic Profile   Lab Results   Component Value Date    NA 140 10/22/2017    K 4.1 10/22/2017    CL 107 10/22/2017    CO2 23 10/22/2017  BUN 23 (H) 10/22/2017    CREA 0.8 06/10/2019    GLU 65 (L) 10/22/2017    CA 9.4 10/22/2017           Lab Results   Component Value Date/Time    HCG urine, QL NEGATIVE  10/22/2017 04:00 PM        Physical Exam:     General Appearance:   Appears in no acute distress.     Lungs:  Clear     Heart:  Regular rate and rhythm     Abdomen:  Soft, non-distended, normal bowel sounds    Site:  No redness, swelling    Impression:  Pleural effusion for thoracinesis    Plan:  NPO  Consent  PT/INR  IV Access          Kern Alberta, MD  June 14, 2019  10:02 AM  Vascular & Interventional Radiology

## 2020-03-18 ENCOUNTER — Inpatient Hospital Stay
Admit: 2020-03-18 | Discharge: 2020-03-18 | Disposition: A | Discharge status: Home / Self Care | Payer: MEDICARE | Attending: Physician Assistant

## 2020-03-18 ENCOUNTER — Emergency Department: Admit: 2020-03-18 | Payer: MEDICARE | Primary: Internal Medicine

## 2020-03-18 DIAGNOSIS — E860 Dehydration: Secondary | ICD-10-CM

## 2020-03-18 LAB — URINALYSIS W/ RFLX MICROSCOPIC
Bilirubin: NEGATIVE
Glucose: 100 mg/dL — AB
Ketone: NEGATIVE mg/dL
Leukocyte Esterase: NEGATIVE
Nitrites: NEGATIVE
Protein: 300 mg/dL — AB
Specific gravity: >1.03 — ABNORMAL HIGH (ref 1.005–1.030)
Urobilinogen: 0.2 EU/dL (ref 0.2–1.0)
pH (UA): 7.5 (ref 5.0–8.0)

## 2020-03-18 LAB — EKG, 12 LEAD, INITIAL
Atrial Rate: 85 {beats}/min
Calculated P Axis: 75 degrees
Calculated R Axis: 68 degrees
Calculated T Axis: -51 degrees
Diagnosis: NORMAL
P-R Interval: 152 ms
Q-T Interval: 390 ms
QRS Duration: 74 ms
QTC Calculation (Bezet): 464 ms
Ventricular Rate: 85 {beats}/min

## 2020-03-18 LAB — HEPATIC FUNCTION PANEL
A-G Ratio: 1.3 (ref 0.8–1.7)
ALT (SGPT): 44 U/L (ref 13–56)
AST (SGOT): 27 U/L (ref 10–38)
Albumin: 4.4 g/dL (ref 3.4–5.0)
Alk. phosphatase: 71 U/L (ref 45–117)
Bilirubin, direct: 0.1 MG/DL (ref 0.0–0.2)
Bilirubin, total: 0.5 MG/DL (ref 0.2–1.0)
Globulin: 3.4 g/dL (ref 2.0–4.0)
Protein, total: 7.8 g/dL (ref 6.4–8.2)

## 2020-03-18 LAB — URINE MICROSCOPIC ONLY
RBC: 0 /hpf (ref 0–5)
WBC: 0 /hpf (ref 0–4)

## 2020-03-18 LAB — CBC WITH AUTOMATED DIFF
ABS. BASOPHILS: 0 10*3/uL (ref 0.0–0.1)
ABS. EOSINOPHILS: 0 10*3/uL (ref 0.0–0.4)
ABS. LYMPHOCYTES: 0.7 10*3/uL — ABNORMAL LOW (ref 0.9–3.6)
ABS. MONOCYTES: 0.5 10*3/uL (ref 0.05–1.2)
ABS. NEUTROPHILS: 6.9 10*3/uL (ref 1.8–8.0)
BASOPHILS: 0 % (ref 0–2)
EOSINOPHILS: 0 % (ref 0–5)
HCT: 40.4 % (ref 35.0–45.0)
HGB: 13.5 g/dL (ref 12.0–16.0)
LYMPHOCYTES: 9 % — ABNORMAL LOW (ref 21–52)
MCH: 29.5 PG (ref 24.0–34.0)
MCHC: 33.4 g/dL (ref 31.0–37.0)
MCV: 88.4 FL (ref 78.0–100.0)
MONOCYTES: 7 % (ref 3–10)
MPV: 9.9 FL (ref 9.2–11.8)
NEUTROPHILS: 84 % — ABNORMAL HIGH (ref 40–73)
PLATELET: 406 10*3/uL (ref 135–420)
RBC: 4.57 M/uL (ref 4.20–5.30)
RDW: 15.8 % — ABNORMAL HIGH (ref 11.6–14.5)
WBC: 8.2 10*3/uL (ref 4.6–13.2)

## 2020-03-18 LAB — METABOLIC PANEL, BASIC
Anion gap: 7 mmol/L (ref 3.0–18)
BUN/Creatinine ratio: 20 (ref 12–20)
BUN: 11 MG/DL (ref 7.0–18)
CO2: 30 mmol/L (ref 21–32)
Calcium: 9.9 MG/DL (ref 8.5–10.1)
Chloride: 95 mmol/L — ABNORMAL LOW (ref 100–111)
Creatinine: 0.56 MG/DL — ABNORMAL LOW (ref 0.6–1.3)
GFR est AA: >60 mL/min/{1.73_m2} (ref >60)
GFR est non-AA: >60 mL/min/{1.73_m2} (ref >60)
Glucose: 137 mg/dL — ABNORMAL HIGH (ref 74–99)
Potassium: 3.7 mmol/L (ref 3.5–5.5)
Sodium: 132 mmol/L — ABNORMAL LOW (ref 136–145)

## 2020-03-18 LAB — POC LACTIC ACID
Lactic Acid (POC): 2.2 mmol/L — CR (ref 0.40–2.00)
Lactic Acid (POC): 1.73 mmol/L (ref 0.40–2.00)

## 2020-03-18 LAB — LIPASE: Lipase: 34 U/L — ABNORMAL LOW (ref 73–393)

## 2020-03-18 MED ORDER — IOPAMIDOL 61 % IV SOLN
300 mg iodine /mL (61 %) | Freq: Once | INTRAVENOUS | Status: AC
Start: 2020-03-18 — End: 2020-03-18
  Administered 2020-03-18: 18:00:00 via INTRAVENOUS

## 2020-03-18 MED ORDER — SODIUM CHLORIDE 0.9 % IJ SYRG
INTRAMUSCULAR | Status: DC | PRN
Start: 2020-03-18 — End: 2020-03-18

## 2020-03-18 MED ORDER — MORPHINE 4 MG/ML INTRAVENOUS SOLUTION
4 mg/mL | INTRAVENOUS | Status: AC
Start: 2020-03-18 — End: 2020-03-18
  Administered 2020-03-18: 19:00:00 via INTRAVENOUS

## 2020-03-18 MED ORDER — ONDANSETRON (PF) 4 MG/2 ML INJECTION
4 mg/2 mL | INTRAMUSCULAR | Status: AC
Start: 2020-03-18 — End: 2020-03-18
  Administered 2020-03-18: 17:00:00 via INTRAVENOUS

## 2020-03-18 MED ORDER — SODIUM CHLORIDE 0.9 % IV PIGGY BACK
3.375 gram | INTRAVENOUS | Status: AC
Start: 2020-03-18 — End: 2020-03-18
  Administered 2020-03-18: 19:00:00 via INTRAVENOUS

## 2020-03-18 MED ORDER — SODIUM CHLORIDE 0.9% BOLUS IV
0.9 % | Freq: Once | INTRAVENOUS | Status: AC
Start: 2020-03-18 — End: 2020-03-18
  Administered 2020-03-18: 17:00:00 via INTRAVENOUS

## 2020-03-18 MED FILL — ONDANSETRON (PF) 4 MG/2 ML INJECTION: 4 mg/2 mL | INTRAMUSCULAR | Qty: 2

## 2020-03-18 MED FILL — BD POSIFLUSH NORMAL SALINE 0.9 % INJECTION SYRINGE: INTRAMUSCULAR | Qty: 10

## 2020-03-18 MED FILL — MORPHINE 4 MG/ML SYRINGE: 4 mg/mL | INTRAMUSCULAR | Qty: 1

## 2020-03-18 MED FILL — SODIUM CHLORIDE 0.9 % IV: INTRAVENOUS | Qty: 1000

## 2020-03-18 MED FILL — PIPERACILLIN-TAZOBACTAM 3.375 GRAM IV SOLR: 3.375 gram | INTRAVENOUS | Qty: 3.38

## 2020-03-18 MED FILL — ISOVUE-300  61 % INTRAVENOUS SOLUTION: 300 mg iodine /mL (61 %) | INTRAVENOUS | Qty: 100

## 2020-03-19 LAB — COVID-19 RAPID TEST: COVID-19 rapid test: NOT DETECTED

## 2020-03-20 LAB — CULTURE, URINE: Culture result:: NO GROWTH

## 2020-03-24 LAB — CULTURE, BLOOD
Culture result:: NO GROWTH
Culture result:: NO GROWTH

## 2020-03-31 ENCOUNTER — Emergency Department: Admit: 2020-03-31 | Payer: MEDICARE | Primary: Internal Medicine

## 2020-03-31 ENCOUNTER — Inpatient Hospital Stay
Admit: 2020-03-31 | Discharge: 2020-03-31 | Disposition: A | Discharge status: Home / Self Care | Payer: MEDICARE | Attending: Emergency Medicine

## 2020-03-31 DIAGNOSIS — G919 Hydrocephalus, unspecified: Secondary | ICD-10-CM

## 2020-03-31 MED ORDER — GADOTERATE MEGLUMINE 0.5 MMOL/ML IV SYRINGE
0.5 mmol/mL | Freq: Once | INTRAVENOUS | Status: AC
Start: 2020-03-31 — End: 2020-03-31
  Administered 2020-03-31: 18:00:00 via INTRAVENOUS

## 2020-03-31 MED ORDER — OXYCODONE-ACETAMINOPHEN 5 MG-325 MG TAB
5-325 mg | ORAL | Status: AC
Start: 2020-03-31 — End: 2020-03-31
  Administered 2020-03-31: 15:00:00 via ORAL

## 2020-03-31 MED FILL — OXYCODONE-ACETAMINOPHEN 5 MG-325 MG TAB: 5-325 mg | ORAL | Qty: 2

## 2020-03-31 MED FILL — DOTAREM 0.5 MMOL/ML INTRAVENOUS SYRINGE: 0.5 mmol/mL | INTRAVENOUS | Qty: 15

## 2020-04-08 ENCOUNTER — Emergency Department: Payer: MEDICARE | Primary: Internal Medicine

## 2020-04-08 ENCOUNTER — Inpatient Hospital Stay: Admit: 2020-04-08 | Discharge: 2020-05-09 | Disposition: E | Payer: MEDICARE | Attending: Emergency Medicine

## 2020-04-08 DIAGNOSIS — I469 Cardiac arrest, cause unspecified: Secondary | ICD-10-CM

## 2020-04-08 LAB — CBC WITH AUTOMATED DIFF
ABS. BASOPHILS: 0.1 10*3/uL (ref 0.0–0.1)
ABS. EOSINOPHILS: 0.1 10*3/uL (ref 0.0–0.4)
ABS. IMM. GRANS.: 0 10*3/uL
ABS. LYMPHOCYTES: 2.8 10*3/uL (ref 0.9–3.6)
ABS. MONOCYTES: 0.6 10*3/uL (ref 0.05–1.2)
ABS. NEUTROPHILS: 7.4 10*3/uL (ref 1.8–8.0)
ABSOLUTE NRBC: 0.03 10*3/uL — ABNORMAL HIGH (ref 0.00–0.01)
BAND NEUTROPHILS: 2 % (ref 0–5)
BASOPHILS: 1 % (ref 0–2)
EOSINOPHILS: 1 % (ref 0–5)
HCT: 37 % (ref 35.0–45.0)
HGB: 11.2 g/dL — ABNORMAL LOW (ref 12.0–16.0)
IMMATURE GRANULOCYTES: 0 %
LYMPHOCYTES: 25 % (ref 21–52)
MCH: 29.6 PG (ref 24.0–34.0)
MCHC: 30.3 g/dL — ABNORMAL LOW (ref 31.0–37.0)
MCV: 97.9 FL (ref 78.0–100.0)
MONOCYTES: 5 % (ref 3–10)
MPV: 9.7 FL (ref 9.2–11.8)
NEUTROPHILS: 66 % (ref 40–73)
NRBC: 0.3 PER 100 WBC — ABNORMAL HIGH
PLATELET COMMENTS: ADEQUATE
PLATELET: 251 10*3/uL (ref 135–420)
RBC: 3.78 M/uL — ABNORMAL LOW (ref 4.20–5.30)
RDW: 14.9 % — ABNORMAL HIGH (ref 11.6–14.5)
WBC: 11 10*3/uL (ref 4.6–13.2)

## 2020-04-08 LAB — BLOOD GAS,CHEM8,LACTIC ACID POC
Anion gap, POC: 15 (ref 10–20)
Base deficit (POC): 3 mmol/L
CO2, POC: 30 MMOL/L — ABNORMAL HIGH (ref 19–24)
Calcium, ionized (POC): 1 mmol/L — ABNORMAL LOW (ref 1.12–1.32)
Chloride (POC): 96 mmol/L — ABNORMAL LOW (ref 98–107)
Creatinine (POC): 1.38 mg/dL — ABNORMAL HIGH (ref 0.6–1.3)
GFRAA, POC: 48 mL/min/{1.73_m2} — ABNORMAL LOW (ref >60)
GFRNA, POC: 39 mL/min/{1.73_m2} — ABNORMAL LOW (ref >60)
Glucose (POC): 268 mg/dL — ABNORMAL HIGH (ref 65–100)
HCO3 (POC): 28.5 MMOL/L — ABNORMAL HIGH (ref 22–26)
Lactic Acid (POC): 12.15 mmol/L — CR (ref 0.40–2.00)
O2 SAT: 90 %
Potassium (POC): 3.6 mmol/L (ref 3.5–5.1)
Sodium (POC): 140 mmol/L (ref 136–145)
pCO2, venous (POC): 92.9 MMHG — ABNORMAL HIGH (ref 41–51)
pH, venous (POC): 7.1 — CL (ref 7.32–7.42)
pO2, venous (POC): 81 mmHg — ABNORMAL HIGH (ref 25–40)

## 2020-04-08 LAB — METABOLIC PANEL, COMPREHENSIVE
A-G Ratio: 0.9 (ref 0.8–1.7)
ALT (SGPT): 962 U/L — ABNORMAL HIGH (ref 13–56)
AST (SGOT): 929 U/L — ABNORMAL HIGH (ref 10–38)
Albumin: 1.9 g/dL — ABNORMAL LOW (ref 3.4–5.0)
Alk. phosphatase: 51 U/L (ref 45–117)
Anion gap: 16 mmol/L (ref 3.0–18)
BUN/Creatinine ratio: 19 (ref 12–20)
BUN: 26 MG/DL — ABNORMAL HIGH (ref 7.0–18)
Bilirubin, total: 0.4 MG/DL (ref 0.2–1.0)
CO2: 29 mmol/L (ref 21–32)
Calcium: 8.4 MG/DL — ABNORMAL LOW (ref 8.5–10.1)
Chloride: 99 mmol/L — ABNORMAL LOW (ref 100–111)
Creatinine: 1.38 MG/DL — ABNORMAL HIGH (ref 0.6–1.3)
GFR est AA: 48 mL/min/{1.73_m2} — ABNORMAL LOW (ref >60)
GFR est non-AA: 39 mL/min/{1.73_m2} — ABNORMAL LOW (ref >60)
Globulin: 2.2 g/dL (ref 2.0–4.0)
Glucose: 267 mg/dL — ABNORMAL HIGH (ref 74–99)
Potassium: 4 mmol/L (ref 3.5–5.5)
Protein, total: 4.1 g/dL — ABNORMAL LOW (ref 6.4–8.2)
Sodium: 144 mmol/L (ref 136–145)

## 2020-04-08 LAB — CARDIAC PANEL,(CK, CKMB & TROPONIN)
CK - MB: 3.1 ng/ml (ref <3.6)
CK-MB Index: 2.9 % (ref 0.0–4.0)
CK: 106 U/L (ref 26–192)
Troponin-I, QT: 1.14 NG/ML — ABNORMAL HIGH (ref 0.0–0.045)

## 2020-04-08 MED ORDER — SODIUM CHLORIDE 0.9 % IJ SYRG
INTRAMUSCULAR | Status: DC | PRN
Start: 2020-04-08 — End: 2020-04-08

## 2020-04-08 MED ORDER — NOREPINEPHRINE 8 MG/250 ML (32 MCG/ML) D5W INFUSION
8 mg/250 mL (32 mcg/mL) | INTRAVENOUS | Status: DC
Start: 2020-04-08 — End: 2020-04-08
  Administered 2020-04-08 (×4): via INTRAVENOUS

## 2020-04-08 MED ORDER — LEVOFLOXACIN IN D5W 750 MG/150 ML IV PIGGY BACK
750 mg/150 mL | INTRAVENOUS | Status: DC
Start: 2020-04-08 — End: 2020-04-08

## 2020-04-08 MED ORDER — SODIUM CHLORIDE 0.9 % IV
1000 mg | Freq: Once | INTRAVENOUS | Status: DC
Start: 2020-04-08 — End: 2020-04-08

## 2020-04-08 MED ORDER — EPINEPHRINE (PF) 1 MG/ML INJECTION
1 mg/mL ( mL) | INTRAMUSCULAR | Status: DC
Start: 2020-04-08 — End: 2020-04-08
  Administered 2020-04-08: 13:00:00

## 2020-04-08 MED ORDER — VANCOMYCIN IN 0.9 % SODIUM CHLORIDE 1.25 GRAM/250 ML IV
1.25 gram/250 mL | Freq: Once | INTRAVENOUS | Status: DC
Start: 2020-04-08 — End: 2020-04-08

## 2020-04-08 MED ORDER — PHARMACY INFORMATION NOTE
Freq: Once | Status: DC
Start: 2020-04-08 — End: 2020-04-08

## 2020-04-08 MED ORDER — PIPERACILLIN-TAZOBACTAM 4.5 GRAM IV SOLR
4.5 gram | Freq: Four times a day (QID) | INTRAVENOUS | Status: DC
Start: 2020-04-08 — End: 2020-04-08

## 2020-04-08 MED ORDER — SODIUM CHLORIDE 0.9% BOLUS IV
0.9 % | Freq: Once | INTRAVENOUS | Status: AC
Start: 2020-04-08 — End: 2020-04-08
  Administered 2020-04-08: 13:00:00 via INTRAVENOUS

## 2020-04-08 MED ORDER — EPINEPHRINE 1 MG/ML IJ SOLN
1 mg/mL | INTRAMUSCULAR | Status: DC
Start: 2020-04-08 — End: 2020-04-08
  Administered 2020-04-08: 13:00:00 via INTRAVENOUS

## 2020-04-08 MED ORDER — SODIUM CHLORIDE 0.9% BOLUS IV
0.9 % | Freq: Once | INTRAVENOUS | Status: AC
Start: 2020-04-08 — End: 2020-04-08
  Administered 2020-04-08: 12:00:00 via INTRAVENOUS

## 2020-04-08 MED FILL — BD POSIFLUSH NORMAL SALINE 0.9 % INJECTION SYRINGE: INTRAMUSCULAR | Qty: 10

## 2020-04-08 MED FILL — PHARMACY INFORMATION NOTE: Qty: 1

## 2020-04-08 MED FILL — SODIUM CHLORIDE 0.9 % IV: INTRAVENOUS | Qty: 1000

## 2020-04-08 MED FILL — ADRENALIN 1 MG/ML INJECTION SOLUTION: 1 mg/mL | INTRAMUSCULAR | Qty: 4

## 2020-04-08 MED FILL — EPINEPHRINE (PF) 1 MG/ML INJECTION: 1 mg/mL ( mL) | INTRAMUSCULAR | Qty: 4

## 2020-04-14 LAB — CULTURE, BLOOD: Culture result:: NO GROWTH

## 2020-05-09 DEATH — deceased
